# Patient Record
Sex: Male | Born: 1945 | Race: Black or African American | Hispanic: No | Marital: Married | Smoking: Light tobacco smoker
Health system: Southern US, Community
[De-identification: ages and names within clinical notes are randomized; demographics above are authoritative.]

## PROBLEM LIST (undated history)

## (undated) DIAGNOSIS — N529 Male erectile dysfunction, unspecified: Secondary | ICD-10-CM

## (undated) DIAGNOSIS — E785 Hyperlipidemia, unspecified: Secondary | ICD-10-CM

## (undated) DIAGNOSIS — I639 Cerebral infarction, unspecified: Secondary | ICD-10-CM

## (undated) DIAGNOSIS — R935 Abnormal findings on diagnostic imaging of other abdominal regions, including retroperitoneum: Secondary | ICD-10-CM

## (undated) DIAGNOSIS — I10 Essential (primary) hypertension: Secondary | ICD-10-CM

## (undated) DIAGNOSIS — L0100 Impetigo, unspecified: Secondary | ICD-10-CM

## (undated) DIAGNOSIS — K279 Peptic ulcer, site unspecified, unspecified as acute or chronic, without hemorrhage or perforation: Secondary | ICD-10-CM

## (undated) DIAGNOSIS — I6529 Occlusion and stenosis of unspecified carotid artery: Secondary | ICD-10-CM

## (undated) DIAGNOSIS — Z720 Tobacco use: Secondary | ICD-10-CM

## (undated) DIAGNOSIS — E876 Hypokalemia: Secondary | ICD-10-CM

## (undated) DIAGNOSIS — M545 Low back pain, unspecified: Secondary | ICD-10-CM

## (undated) DIAGNOSIS — R531 Weakness: Secondary | ICD-10-CM

## (undated) HISTORY — DX: Hypokalemia: E87.6

## (undated) HISTORY — DX: Weakness: R53.1

## (undated) HISTORY — DX: Peptic ulcer, site unspecified, unspecified as acute or chronic, without hemorrhage or perforation: K27.9

## (undated) HISTORY — DX: Cerebral infarction, unspecified: I63.9

## (undated) HISTORY — DX: Abnormal findings on diagnostic imaging of other abdominal regions, including retroperitoneum: R93.5

## (undated) HISTORY — DX: Low back pain: M54.5

## (undated) HISTORY — DX: Impetigo, unspecified: L01.00

## (undated) HISTORY — DX: Hyperlipidemia, unspecified: E78.5

## (undated) HISTORY — DX: Essential (primary) hypertension: I10

## (undated) HISTORY — DX: Tobacco use: Z72.0

## (undated) HISTORY — DX: Low back pain, unspecified: M54.50

## (undated) HISTORY — DX: Male erectile dysfunction, unspecified: N52.9

## (undated) HISTORY — DX: Occlusion and stenosis of unspecified carotid artery: I65.29

---

## 1999-06-10 ENCOUNTER — Emergency Department (HOSPITAL_COMMUNITY): Admission: EM | Admit: 1999-06-10 | Discharge: 1999-06-10 | Payer: Self-pay | Admitting: Emergency Medicine

## 2001-09-30 ENCOUNTER — Emergency Department (HOSPITAL_COMMUNITY): Admission: EM | Admit: 2001-09-30 | Discharge: 2001-09-30 | Payer: Self-pay

## 2002-08-12 DIAGNOSIS — I639 Cerebral infarction, unspecified: Secondary | ICD-10-CM

## 2002-08-12 HISTORY — DX: Cerebral infarction, unspecified: I63.9

## 2002-11-29 ENCOUNTER — Inpatient Hospital Stay (HOSPITAL_COMMUNITY): Admission: EM | Admit: 2002-11-29 | Discharge: 2002-12-02 | Payer: Self-pay | Admitting: *Deleted

## 2002-11-29 ENCOUNTER — Encounter: Payer: Self-pay | Admitting: Neurology

## 2002-11-29 ENCOUNTER — Encounter: Payer: Self-pay | Admitting: *Deleted

## 2002-11-30 ENCOUNTER — Encounter (INDEPENDENT_AMBULATORY_CARE_PROVIDER_SITE_OTHER): Payer: Self-pay | Admitting: Cardiology

## 2002-12-01 ENCOUNTER — Encounter: Payer: Self-pay | Admitting: Neurology

## 2002-12-02 ENCOUNTER — Inpatient Hospital Stay (HOSPITAL_COMMUNITY)
Admission: RE | Admit: 2002-12-02 | Discharge: 2002-12-24 | Payer: Self-pay | Admitting: Physical Medicine & Rehabilitation

## 2002-12-09 ENCOUNTER — Encounter: Payer: Self-pay | Admitting: Physical Medicine & Rehabilitation

## 2002-12-27 ENCOUNTER — Encounter: Admission: RE | Admit: 2002-12-27 | Discharge: 2002-12-27 | Payer: Self-pay | Admitting: Internal Medicine

## 2003-01-03 ENCOUNTER — Encounter: Admission: RE | Admit: 2003-01-03 | Discharge: 2003-01-03 | Payer: Self-pay | Admitting: Internal Medicine

## 2003-01-06 ENCOUNTER — Encounter: Admission: RE | Admit: 2003-01-06 | Discharge: 2003-01-06 | Payer: Self-pay | Admitting: Internal Medicine

## 2003-01-17 ENCOUNTER — Encounter: Admission: RE | Admit: 2003-01-17 | Discharge: 2003-01-17 | Payer: Self-pay | Admitting: Internal Medicine

## 2003-01-31 ENCOUNTER — Encounter
Admission: RE | Admit: 2003-01-31 | Discharge: 2003-05-01 | Payer: Self-pay | Admitting: Physical Medicine & Rehabilitation

## 2003-02-07 ENCOUNTER — Encounter: Admission: RE | Admit: 2003-02-07 | Discharge: 2003-02-07 | Payer: Self-pay | Admitting: Internal Medicine

## 2003-03-07 ENCOUNTER — Encounter: Admission: RE | Admit: 2003-03-07 | Discharge: 2003-03-07 | Payer: Self-pay | Admitting: Internal Medicine

## 2003-03-08 ENCOUNTER — Encounter
Admission: RE | Admit: 2003-03-08 | Discharge: 2003-06-06 | Payer: Self-pay | Admitting: Physical Medicine & Rehabilitation

## 2003-03-11 ENCOUNTER — Encounter: Admission: RE | Admit: 2003-03-11 | Discharge: 2003-03-11 | Payer: Self-pay | Admitting: Internal Medicine

## 2003-04-04 ENCOUNTER — Encounter: Admission: RE | Admit: 2003-04-04 | Discharge: 2003-04-04 | Payer: Self-pay | Admitting: Infectious Diseases

## 2003-05-06 ENCOUNTER — Encounter: Admission: RE | Admit: 2003-05-06 | Discharge: 2003-05-06 | Payer: Self-pay | Admitting: Internal Medicine

## 2003-05-09 ENCOUNTER — Encounter: Admission: RE | Admit: 2003-05-09 | Discharge: 2003-05-09 | Payer: Self-pay | Admitting: Internal Medicine

## 2003-05-11 ENCOUNTER — Encounter
Admission: RE | Admit: 2003-05-11 | Discharge: 2003-08-09 | Payer: Self-pay | Admitting: Physical Medicine & Rehabilitation

## 2003-05-13 ENCOUNTER — Encounter: Admission: RE | Admit: 2003-05-13 | Discharge: 2003-05-13 | Payer: Self-pay | Admitting: Internal Medicine

## 2003-06-07 ENCOUNTER — Encounter
Admission: RE | Admit: 2003-06-07 | Discharge: 2003-09-05 | Payer: Self-pay | Admitting: Physical Medicine & Rehabilitation

## 2003-06-13 ENCOUNTER — Encounter: Admission: RE | Admit: 2003-06-13 | Discharge: 2003-06-13 | Payer: Self-pay | Admitting: Internal Medicine

## 2003-07-11 ENCOUNTER — Encounter: Admission: RE | Admit: 2003-07-11 | Discharge: 2003-07-11 | Payer: Self-pay | Admitting: Internal Medicine

## 2003-07-29 ENCOUNTER — Encounter: Admission: RE | Admit: 2003-07-29 | Discharge: 2003-07-29 | Payer: Self-pay | Admitting: Internal Medicine

## 2003-08-11 ENCOUNTER — Encounter: Admission: RE | Admit: 2003-08-11 | Discharge: 2003-08-11 | Payer: Self-pay | Admitting: Internal Medicine

## 2003-08-18 ENCOUNTER — Ambulatory Visit (HOSPITAL_COMMUNITY): Admission: RE | Admit: 2003-08-18 | Discharge: 2003-08-18 | Payer: Self-pay | Admitting: Anesthesiology

## 2003-08-22 ENCOUNTER — Encounter: Admission: RE | Admit: 2003-08-22 | Discharge: 2003-08-22 | Payer: Self-pay | Admitting: Internal Medicine

## 2003-09-12 ENCOUNTER — Encounter: Admission: RE | Admit: 2003-09-12 | Discharge: 2003-09-12 | Payer: Self-pay | Admitting: Internal Medicine

## 2003-09-13 ENCOUNTER — Ambulatory Visit (HOSPITAL_COMMUNITY): Admission: RE | Admit: 2003-09-13 | Discharge: 2003-09-13 | Payer: Self-pay | Admitting: Internal Medicine

## 2003-09-20 ENCOUNTER — Encounter
Admission: RE | Admit: 2003-09-20 | Discharge: 2003-11-22 | Payer: Self-pay | Admitting: Physical Medicine & Rehabilitation

## 2003-10-04 ENCOUNTER — Encounter
Admission: RE | Admit: 2003-10-04 | Discharge: 2003-11-22 | Payer: Self-pay | Admitting: Physical Medicine & Rehabilitation

## 2003-10-13 ENCOUNTER — Encounter: Admission: RE | Admit: 2003-10-13 | Discharge: 2003-10-13 | Payer: Self-pay | Admitting: Internal Medicine

## 2003-10-17 ENCOUNTER — Encounter: Admission: RE | Admit: 2003-10-17 | Discharge: 2003-10-17 | Payer: Self-pay | Admitting: Internal Medicine

## 2003-11-02 ENCOUNTER — Encounter: Admission: RE | Admit: 2003-11-02 | Discharge: 2003-11-02 | Payer: Self-pay | Admitting: Internal Medicine

## 2003-11-14 ENCOUNTER — Encounter: Admission: RE | Admit: 2003-11-14 | Discharge: 2003-11-14 | Payer: Self-pay | Admitting: Internal Medicine

## 2003-11-22 ENCOUNTER — Encounter
Admission: RE | Admit: 2003-11-22 | Discharge: 2004-02-20 | Payer: Self-pay | Admitting: Physical Medicine & Rehabilitation

## 2003-12-12 ENCOUNTER — Encounter: Admission: RE | Admit: 2003-12-12 | Discharge: 2003-12-12 | Payer: Self-pay | Admitting: Internal Medicine

## 2004-01-09 ENCOUNTER — Encounter: Admission: RE | Admit: 2004-01-09 | Discharge: 2004-01-09 | Payer: Self-pay | Admitting: Internal Medicine

## 2004-02-06 ENCOUNTER — Encounter: Admission: RE | Admit: 2004-02-06 | Discharge: 2004-02-06 | Payer: Self-pay | Admitting: Internal Medicine

## 2004-02-19 ENCOUNTER — Emergency Department (HOSPITAL_COMMUNITY): Admission: EM | Admit: 2004-02-19 | Discharge: 2004-02-19 | Payer: Self-pay | Admitting: Emergency Medicine

## 2004-02-28 ENCOUNTER — Encounter: Admission: RE | Admit: 2004-02-28 | Discharge: 2004-02-28 | Payer: Self-pay | Admitting: Internal Medicine

## 2004-02-28 ENCOUNTER — Ambulatory Visit (HOSPITAL_COMMUNITY): Admission: RE | Admit: 2004-02-28 | Discharge: 2004-02-28 | Payer: Self-pay | Admitting: Internal Medicine

## 2004-03-05 ENCOUNTER — Encounter: Admission: RE | Admit: 2004-03-05 | Discharge: 2004-03-05 | Payer: Self-pay | Admitting: Internal Medicine

## 2004-03-06 ENCOUNTER — Encounter: Admission: RE | Admit: 2004-03-06 | Discharge: 2004-03-06 | Payer: Self-pay | Admitting: Internal Medicine

## 2004-03-26 ENCOUNTER — Encounter: Admission: RE | Admit: 2004-03-26 | Discharge: 2004-03-26 | Payer: Self-pay | Admitting: Internal Medicine

## 2004-04-23 ENCOUNTER — Ambulatory Visit: Payer: Self-pay | Admitting: Internal Medicine

## 2004-05-21 ENCOUNTER — Ambulatory Visit: Payer: Self-pay | Admitting: Internal Medicine

## 2004-06-18 ENCOUNTER — Ambulatory Visit: Payer: Self-pay | Admitting: Internal Medicine

## 2004-07-19 ENCOUNTER — Ambulatory Visit: Payer: Self-pay | Admitting: Internal Medicine

## 2004-08-16 ENCOUNTER — Ambulatory Visit: Payer: Self-pay | Admitting: Internal Medicine

## 2004-09-10 ENCOUNTER — Ambulatory Visit: Payer: Self-pay | Admitting: Internal Medicine

## 2004-09-12 ENCOUNTER — Ambulatory Visit: Payer: Self-pay | Admitting: Internal Medicine

## 2004-09-19 ENCOUNTER — Ambulatory Visit: Payer: Self-pay | Admitting: Internal Medicine

## 2004-10-08 ENCOUNTER — Ambulatory Visit: Payer: Self-pay | Admitting: Internal Medicine

## 2004-11-05 ENCOUNTER — Ambulatory Visit (HOSPITAL_COMMUNITY): Admission: RE | Admit: 2004-11-05 | Discharge: 2004-11-05 | Payer: Self-pay | Admitting: Internal Medicine

## 2004-11-05 ENCOUNTER — Ambulatory Visit: Payer: Self-pay | Admitting: Internal Medicine

## 2004-11-15 ENCOUNTER — Ambulatory Visit: Payer: Self-pay | Admitting: Internal Medicine

## 2004-12-03 ENCOUNTER — Ambulatory Visit: Payer: Self-pay

## 2004-12-10 ENCOUNTER — Ambulatory Visit (HOSPITAL_COMMUNITY): Admission: RE | Admit: 2004-12-10 | Discharge: 2004-12-10 | Payer: Self-pay | Admitting: Internal Medicine

## 2004-12-10 ENCOUNTER — Ambulatory Visit: Payer: Self-pay | Admitting: Internal Medicine

## 2004-12-20 ENCOUNTER — Ambulatory Visit: Payer: Self-pay | Admitting: Internal Medicine

## 2005-01-17 ENCOUNTER — Ambulatory Visit: Payer: Self-pay

## 2005-02-18 ENCOUNTER — Ambulatory Visit: Payer: Self-pay | Admitting: Internal Medicine

## 2005-03-25 ENCOUNTER — Ambulatory Visit: Payer: Self-pay | Admitting: Internal Medicine

## 2005-04-22 ENCOUNTER — Ambulatory Visit: Payer: Self-pay | Admitting: Internal Medicine

## 2005-05-20 ENCOUNTER — Ambulatory Visit: Payer: Self-pay

## 2005-06-24 ENCOUNTER — Ambulatory Visit: Payer: Self-pay | Admitting: Internal Medicine

## 2005-07-22 ENCOUNTER — Ambulatory Visit: Payer: Self-pay | Admitting: Internal Medicine

## 2005-08-19 ENCOUNTER — Ambulatory Visit: Payer: Self-pay | Admitting: Internal Medicine

## 2005-09-16 ENCOUNTER — Ambulatory Visit: Payer: Self-pay | Admitting: Internal Medicine

## 2005-10-14 ENCOUNTER — Ambulatory Visit: Payer: Self-pay | Admitting: Hospitalist

## 2005-10-22 ENCOUNTER — Ambulatory Visit: Payer: Self-pay | Admitting: Hospitalist

## 2005-10-25 ENCOUNTER — Ambulatory Visit: Payer: Self-pay | Admitting: Internal Medicine

## 2005-11-18 ENCOUNTER — Ambulatory Visit: Payer: Self-pay | Admitting: Internal Medicine

## 2005-12-06 ENCOUNTER — Ambulatory Visit (HOSPITAL_COMMUNITY): Admission: RE | Admit: 2005-12-06 | Discharge: 2005-12-06 | Payer: Self-pay | Admitting: Internal Medicine

## 2005-12-06 ENCOUNTER — Ambulatory Visit: Payer: Self-pay | Admitting: Internal Medicine

## 2005-12-16 ENCOUNTER — Ambulatory Visit: Payer: Self-pay | Admitting: Internal Medicine

## 2006-01-13 ENCOUNTER — Ambulatory Visit: Payer: Self-pay | Admitting: Internal Medicine

## 2006-02-10 ENCOUNTER — Ambulatory Visit: Payer: Self-pay | Admitting: Internal Medicine

## 2006-03-03 ENCOUNTER — Ambulatory Visit: Payer: Self-pay | Admitting: Hospitalist

## 2006-03-31 ENCOUNTER — Ambulatory Visit: Payer: Self-pay | Admitting: Internal Medicine

## 2006-04-15 ENCOUNTER — Ambulatory Visit: Payer: Self-pay | Admitting: Hospitalist

## 2006-04-28 ENCOUNTER — Ambulatory Visit: Payer: Self-pay | Admitting: Hospitalist

## 2006-05-21 DIAGNOSIS — M545 Low back pain: Secondary | ICD-10-CM

## 2006-05-21 DIAGNOSIS — L0100 Impetigo, unspecified: Secondary | ICD-10-CM | POA: Insufficient documentation

## 2006-05-21 DIAGNOSIS — I10 Essential (primary) hypertension: Secondary | ICD-10-CM

## 2006-05-21 DIAGNOSIS — E785 Hyperlipidemia, unspecified: Secondary | ICD-10-CM

## 2006-05-21 DIAGNOSIS — I6789 Other cerebrovascular disease: Secondary | ICD-10-CM

## 2006-05-26 ENCOUNTER — Ambulatory Visit: Payer: Self-pay | Admitting: Internal Medicine

## 2006-06-23 ENCOUNTER — Ambulatory Visit: Payer: Self-pay | Admitting: Hospitalist

## 2006-07-21 ENCOUNTER — Ambulatory Visit: Payer: Self-pay | Admitting: Internal Medicine

## 2006-08-14 ENCOUNTER — Ambulatory Visit: Payer: Self-pay | Admitting: Hospitalist

## 2006-08-14 LAB — CONVERTED CEMR LAB
ALT: 18 units/L (ref 0–53)
AST: 20 units/L (ref 0–37)
Albumin: 3.8 g/dL (ref 3.5–5.2)
CO2: 34 meq/L — ABNORMAL HIGH (ref 19–32)
Calcium: 9.6 mg/dL (ref 8.4–10.5)
Chloride: 95 meq/L — ABNORMAL LOW (ref 96–112)
Creatinine, Ser: 0.66 mg/dL (ref 0.40–1.50)
Potassium: 3.2 meq/L — ABNORMAL LOW (ref 3.5–5.3)
Sodium: 137 meq/L (ref 135–145)
Total Protein: 7.4 g/dL (ref 6.0–8.3)

## 2006-08-18 ENCOUNTER — Ambulatory Visit: Payer: Self-pay | Admitting: Hospitalist

## 2006-08-21 ENCOUNTER — Encounter (INDEPENDENT_AMBULATORY_CARE_PROVIDER_SITE_OTHER): Payer: Self-pay | Admitting: Hospitalist

## 2006-08-21 ENCOUNTER — Ambulatory Visit: Payer: Self-pay | Admitting: Internal Medicine

## 2006-08-21 LAB — CONVERTED CEMR LAB
BUN: 11 mg/dL (ref 6–23)
CO2: 32 meq/L (ref 19–32)
Chloride: 100 meq/L (ref 96–112)
Glucose, Bld: 85 mg/dL (ref 70–99)
Potassium: 3.6 meq/L (ref 3.5–5.3)

## 2006-08-31 DIAGNOSIS — Z8711 Personal history of peptic ulcer disease: Secondary | ICD-10-CM

## 2006-09-08 ENCOUNTER — Ambulatory Visit: Payer: Self-pay | Admitting: Internal Medicine

## 2006-09-15 ENCOUNTER — Ambulatory Visit: Payer: Self-pay | Admitting: Internal Medicine

## 2006-09-15 ENCOUNTER — Telehealth (INDEPENDENT_AMBULATORY_CARE_PROVIDER_SITE_OTHER): Payer: Self-pay | Admitting: *Deleted

## 2006-09-15 ENCOUNTER — Encounter (INDEPENDENT_AMBULATORY_CARE_PROVIDER_SITE_OTHER): Payer: Self-pay | Admitting: Internal Medicine

## 2006-09-15 LAB — CONVERTED CEMR LAB
Albumin: 4.3 g/dL (ref 3.5–5.2)
Alkaline Phosphatase: 93 units/L (ref 39–117)
BUN: 14 mg/dL (ref 6–23)
Glucose, Bld: 83 mg/dL (ref 70–99)
HDL: 33 mg/dL — ABNORMAL LOW (ref >39)
LDL Cholesterol: 92 mg/dL (ref 0–99)
Potassium: 3.2 meq/L — ABNORMAL LOW (ref 3.5–5.3)
Total Bilirubin: 1 mg/dL (ref 0.3–1.2)
Triglycerides: 40 mg/dL (ref <150)

## 2006-09-16 ENCOUNTER — Encounter (INDEPENDENT_AMBULATORY_CARE_PROVIDER_SITE_OTHER): Payer: Self-pay | Admitting: *Deleted

## 2006-09-17 ENCOUNTER — Telehealth: Payer: Self-pay | Admitting: *Deleted

## 2006-10-13 ENCOUNTER — Ambulatory Visit: Payer: Self-pay | Admitting: *Deleted

## 2006-10-20 ENCOUNTER — Telehealth: Payer: Self-pay | Admitting: *Deleted

## 2006-10-27 ENCOUNTER — Encounter (INDEPENDENT_AMBULATORY_CARE_PROVIDER_SITE_OTHER): Payer: Self-pay | Admitting: Pharmacist

## 2006-10-27 ENCOUNTER — Ambulatory Visit: Payer: Self-pay | Admitting: Internal Medicine

## 2006-10-27 LAB — CONVERTED CEMR LAB

## 2006-12-01 ENCOUNTER — Ambulatory Visit: Payer: Self-pay | Admitting: Internal Medicine

## 2006-12-01 LAB — CONVERTED CEMR LAB: INR: 2

## 2006-12-29 ENCOUNTER — Ambulatory Visit: Payer: Self-pay | Admitting: Internal Medicine

## 2006-12-29 LAB — CONVERTED CEMR LAB

## 2007-01-13 ENCOUNTER — Telehealth (INDEPENDENT_AMBULATORY_CARE_PROVIDER_SITE_OTHER): Payer: Self-pay | Admitting: *Deleted

## 2007-01-19 ENCOUNTER — Ambulatory Visit: Payer: Self-pay | Admitting: Internal Medicine

## 2007-01-19 ENCOUNTER — Telehealth: Payer: Self-pay | Admitting: *Deleted

## 2007-01-19 LAB — CONVERTED CEMR LAB: INR: 2.8

## 2007-02-03 ENCOUNTER — Ambulatory Visit: Payer: Self-pay | Admitting: Internal Medicine

## 2007-02-03 LAB — CONVERTED CEMR LAB
BUN: 12 mg/dL (ref 6–23)
CO2: 31 meq/L (ref 19–32)
Calcium: 9.3 mg/dL (ref 8.4–10.5)
Creatinine, Ser: 0.86 mg/dL (ref 0.40–1.50)
Glucose, Bld: 142 mg/dL — ABNORMAL HIGH (ref 70–99)

## 2007-02-09 ENCOUNTER — Telehealth: Payer: Self-pay | Admitting: *Deleted

## 2007-02-09 ENCOUNTER — Ambulatory Visit: Payer: Self-pay | Admitting: Internal Medicine

## 2007-02-09 ENCOUNTER — Encounter (INDEPENDENT_AMBULATORY_CARE_PROVIDER_SITE_OTHER): Payer: Self-pay | Admitting: Internal Medicine

## 2007-02-09 LAB — CONVERTED CEMR LAB
BUN: 14 mg/dL (ref 6–23)
Calcium: 9.8 mg/dL (ref 8.4–10.5)
Creatinine, Ser: 0.99 mg/dL (ref 0.40–1.50)
Glucose, Bld: 114 mg/dL — ABNORMAL HIGH (ref 70–99)
Sodium: 136 meq/L (ref 135–145)

## 2007-02-27 ENCOUNTER — Encounter (INDEPENDENT_AMBULATORY_CARE_PROVIDER_SITE_OTHER): Payer: Self-pay | Admitting: Internal Medicine

## 2007-02-27 ENCOUNTER — Ambulatory Visit: Payer: Self-pay | Admitting: *Deleted

## 2007-02-27 DIAGNOSIS — F528 Other sexual dysfunction not due to a substance or known physiological condition: Secondary | ICD-10-CM | POA: Insufficient documentation

## 2007-03-01 LAB — CONVERTED CEMR LAB
ALT: 18 units/L (ref 0–53)
AST: 22 units/L (ref 0–37)
Alkaline Phosphatase: 120 units/L — ABNORMAL HIGH (ref 39–117)
Sodium: 138 meq/L (ref 135–145)
Total Bilirubin: 0.7 mg/dL (ref 0.3–1.2)
Total Protein: 8.8 g/dL — ABNORMAL HIGH (ref 6.0–8.3)

## 2007-03-02 ENCOUNTER — Telehealth: Payer: Self-pay | Admitting: *Deleted

## 2007-03-09 ENCOUNTER — Ambulatory Visit: Payer: Self-pay | Admitting: *Deleted

## 2007-03-09 ENCOUNTER — Telehealth (INDEPENDENT_AMBULATORY_CARE_PROVIDER_SITE_OTHER): Payer: Self-pay | Admitting: *Deleted

## 2007-03-16 ENCOUNTER — Telehealth (INDEPENDENT_AMBULATORY_CARE_PROVIDER_SITE_OTHER): Payer: Self-pay | Admitting: Internal Medicine

## 2007-04-06 ENCOUNTER — Ambulatory Visit: Payer: Self-pay | Admitting: Internal Medicine

## 2007-04-06 LAB — CONVERTED CEMR LAB: INR: 2.2

## 2007-04-17 ENCOUNTER — Ambulatory Visit: Payer: Self-pay | Admitting: Hospitalist

## 2007-04-17 ENCOUNTER — Encounter (INDEPENDENT_AMBULATORY_CARE_PROVIDER_SITE_OTHER): Payer: Self-pay | Admitting: Internal Medicine

## 2007-04-17 DIAGNOSIS — R5381 Other malaise: Secondary | ICD-10-CM | POA: Insufficient documentation

## 2007-04-17 DIAGNOSIS — R5383 Other fatigue: Secondary | ICD-10-CM

## 2007-04-20 ENCOUNTER — Telehealth: Payer: Self-pay | Admitting: *Deleted

## 2007-04-21 ENCOUNTER — Ambulatory Visit (HOSPITAL_COMMUNITY): Admission: RE | Admit: 2007-04-21 | Discharge: 2007-04-21 | Payer: Self-pay | Admitting: Internal Medicine

## 2007-04-21 LAB — CONVERTED CEMR LAB
ALT: 15 units/L (ref 0–53)
AST: 17 units/L (ref 0–37)
Albumin: 4.4 g/dL (ref 3.5–5.2)
Alkaline Phosphatase: 94 units/L (ref 39–117)
BUN: 16 mg/dL (ref 6–23)
CO2: 29 meq/L (ref 19–32)
Calcium: 9.4 mg/dL (ref 8.4–10.5)
Chloride: 101 meq/L (ref 96–112)
Creatinine, Ser: 0.93 mg/dL (ref 0.40–1.50)
Glucose, Bld: 72 mg/dL (ref 70–99)
HCT: 41.2 % (ref 39.0–52.0)
Hemoglobin: 13.4 g/dL (ref 13.0–17.0)
LDH: 140 units/L (ref 94–250)
MCHC: 32.5 g/dL (ref 30.0–36.0)
MCV: 82.9 fL (ref 78.0–100.0)
Platelets: 227 10*3/uL (ref 150–400)
Potassium: 3.7 meq/L (ref 3.5–5.3)
RBC: 4.97 M/uL (ref 4.22–5.81)
RDW: 15.2 % — ABNORMAL HIGH (ref 11.5–14.0)
Sed Rate: 18 mm/hr — ABNORMAL HIGH (ref 0–16)
Sodium: 140 meq/L (ref 135–145)
TSH: 1.208 microintl units/mL (ref 0.350–5.50)
Testosterone: 397.3 ng/dL (ref 350–890)
Total Bilirubin: 0.7 mg/dL (ref 0.3–1.2)
Total Protein: 7.7 g/dL (ref 6.0–8.3)
Vit D, 1,25-Dihydroxy: 29 (ref 20–57)
WBC: 4 10*3/uL (ref 4.0–10.5)

## 2007-05-04 ENCOUNTER — Ambulatory Visit: Payer: Self-pay | Admitting: Internal Medicine

## 2007-05-19 ENCOUNTER — Telehealth: Payer: Self-pay | Admitting: *Deleted

## 2007-06-01 ENCOUNTER — Ambulatory Visit: Payer: Self-pay | Admitting: Infectious Diseases

## 2007-06-01 LAB — CONVERTED CEMR LAB: INR: 3.6

## 2007-06-19 ENCOUNTER — Telehealth: Payer: Self-pay | Admitting: *Deleted

## 2007-06-29 ENCOUNTER — Ambulatory Visit: Payer: Self-pay | Admitting: *Deleted

## 2007-07-27 ENCOUNTER — Ambulatory Visit: Payer: Self-pay | Admitting: Infectious Diseases

## 2007-07-27 LAB — CONVERTED CEMR LAB: INR: 2.6

## 2007-08-24 ENCOUNTER — Ambulatory Visit: Payer: Self-pay | Admitting: Internal Medicine

## 2007-08-24 LAB — CONVERTED CEMR LAB

## 2007-08-25 ENCOUNTER — Ambulatory Visit: Payer: Self-pay | Admitting: Vascular Surgery

## 2007-09-21 ENCOUNTER — Ambulatory Visit: Payer: Self-pay | Admitting: Hospitalist

## 2007-09-21 LAB — CONVERTED CEMR LAB: INR: 2.3

## 2007-09-22 ENCOUNTER — Telehealth (INDEPENDENT_AMBULATORY_CARE_PROVIDER_SITE_OTHER): Payer: Self-pay | Admitting: Internal Medicine

## 2007-10-19 ENCOUNTER — Ambulatory Visit: Payer: Self-pay | Admitting: Infectious Diseases

## 2007-11-09 ENCOUNTER — Ambulatory Visit: Payer: Self-pay | Admitting: Internal Medicine

## 2007-11-09 LAB — CONVERTED CEMR LAB: INR: 2.8

## 2007-12-07 ENCOUNTER — Ambulatory Visit: Payer: Self-pay | Admitting: Hospitalist

## 2007-12-07 LAB — CONVERTED CEMR LAB: INR: 2.9

## 2007-12-31 ENCOUNTER — Ambulatory Visit: Payer: Self-pay | Admitting: Internal Medicine

## 2007-12-31 LAB — CONVERTED CEMR LAB: INR: 3

## 2008-01-18 ENCOUNTER — Ambulatory Visit: Payer: Self-pay | Admitting: *Deleted

## 2008-01-22 ENCOUNTER — Ambulatory Visit: Payer: Self-pay | Admitting: *Deleted

## 2008-01-22 ENCOUNTER — Encounter (INDEPENDENT_AMBULATORY_CARE_PROVIDER_SITE_OTHER): Payer: Self-pay | Admitting: Internal Medicine

## 2008-01-22 LAB — CONVERTED CEMR LAB
Alkaline Phosphatase: 95 units/L (ref 39–117)
BUN: 16 mg/dL (ref 6–23)
CO2: 31 meq/L (ref 19–32)
Cholesterol: 117 mg/dL (ref 0–200)
Creatinine, Ser: 1.01 mg/dL (ref 0.40–1.50)
Glucose, Bld: 101 mg/dL — ABNORMAL HIGH (ref 70–99)
HDL: 36 mg/dL — ABNORMAL LOW (ref >39)
LDL Cholesterol: 74 mg/dL (ref 0–99)
Sodium: 139 meq/L (ref 135–145)
Total Bilirubin: 0.8 mg/dL (ref 0.3–1.2)
Total CHOL/HDL Ratio: 3.3
Total Protein: 7.2 g/dL (ref 6.0–8.3)
Triglycerides: 37 mg/dL (ref <150)
VLDL: 7 mg/dL (ref 0–40)

## 2008-02-23 ENCOUNTER — Ambulatory Visit: Payer: Self-pay | Admitting: Vascular Surgery

## 2008-02-29 ENCOUNTER — Ambulatory Visit: Payer: Self-pay | Admitting: Infectious Diseases

## 2008-03-31 ENCOUNTER — Encounter (INDEPENDENT_AMBULATORY_CARE_PROVIDER_SITE_OTHER): Payer: Self-pay | Admitting: Internal Medicine

## 2008-04-04 ENCOUNTER — Ambulatory Visit: Payer: Self-pay | Admitting: Internal Medicine

## 2008-04-04 LAB — CONVERTED CEMR LAB

## 2008-04-06 ENCOUNTER — Emergency Department (HOSPITAL_COMMUNITY): Admission: EM | Admit: 2008-04-06 | Discharge: 2008-04-06 | Payer: Self-pay | Admitting: Emergency Medicine

## 2008-04-06 ENCOUNTER — Telehealth: Payer: Self-pay | Admitting: Internal Medicine

## 2008-04-08 ENCOUNTER — Ambulatory Visit: Payer: Self-pay | Admitting: Internal Medicine

## 2008-04-08 ENCOUNTER — Encounter (INDEPENDENT_AMBULATORY_CARE_PROVIDER_SITE_OTHER): Payer: Self-pay | Admitting: Internal Medicine

## 2008-04-11 LAB — CONVERTED CEMR LAB
Albumin: 4 g/dL (ref 3.5–5.2)
Alkaline Phosphatase: 89 units/L (ref 39–117)
BUN: 13 mg/dL (ref 6–23)
Calcium: 9.5 mg/dL (ref 8.4–10.5)
Chloride: 105 meq/L (ref 96–112)
Creatinine, Ser: 0.86 mg/dL (ref 0.40–1.50)
Eosinophils Absolute: 0.1 10*3/uL (ref 0.0–0.7)
Glucose, Bld: 120 mg/dL — ABNORMAL HIGH (ref 70–99)
Hemoglobin: 11.9 g/dL — ABNORMAL LOW (ref 13.0–17.0)
Lymphs Abs: 1.3 10*3/uL (ref 0.7–4.0)
MCHC: 32.6 g/dL (ref 30.0–36.0)
MCV: 83.5 fL (ref 78.0–100.0)
Monocytes Absolute: 0.4 10*3/uL (ref 0.1–1.0)
Monocytes Relative: 8 % (ref 3–12)
Neutrophils Relative %: 60 % (ref 43–77)
Potassium: 3.9 meq/L (ref 3.5–5.3)
RBC: 4.37 M/uL (ref 4.22–5.81)
WBC: 4.4 10*3/uL (ref 4.0–10.5)

## 2008-04-22 ENCOUNTER — Encounter (INDEPENDENT_AMBULATORY_CARE_PROVIDER_SITE_OTHER): Payer: Self-pay | Admitting: Internal Medicine

## 2008-04-22 ENCOUNTER — Ambulatory Visit: Payer: Self-pay | Admitting: Internal Medicine

## 2008-04-22 LAB — CONVERTED CEMR LAB: OCCULT 1: NEGATIVE

## 2008-04-23 LAB — CONVERTED CEMR LAB: OCCULT 3: NEGATIVE

## 2008-04-24 LAB — CONVERTED CEMR LAB: OCCULT 2: NEGATIVE

## 2008-04-25 ENCOUNTER — Ambulatory Visit (HOSPITAL_COMMUNITY): Admission: RE | Admit: 2008-04-25 | Discharge: 2008-04-25 | Payer: Self-pay | Admitting: Internal Medicine

## 2008-04-25 DIAGNOSIS — I69959 Hemiplegia and hemiparesis following unspecified cerebrovascular disease affecting unspecified side: Secondary | ICD-10-CM | POA: Insufficient documentation

## 2008-04-25 LAB — CONVERTED CEMR LAB
ALT: 21 units/L (ref 0–53)
AST: 18 units/L (ref 0–37)
Albumin: 4.2 g/dL (ref 3.5–5.2)
Alkaline Phosphatase: 99 units/L (ref 39–117)
BUN: 8 mg/dL (ref 6–23)
Basophils Absolute: 0 10*3/uL (ref 0.0–0.1)
Basophils Relative: 1 % (ref 0–1)
Eosinophils Absolute: 0.1 10*3/uL (ref 0.0–0.7)
Iron: 57 ug/dL (ref 42–165)
MCHC: 31.1 g/dL (ref 30.0–36.0)
MCV: 85.4 fL (ref 78.0–100.0)
Neutro Abs: 2.2 10*3/uL (ref 1.7–7.7)
Neutrophils Relative %: 53 % (ref 43–77)
PSA: 0.73 ng/mL (ref 0.10–4.00)
Platelets: 277 10*3/uL (ref 150–400)
Potassium: 4.4 meq/L (ref 3.5–5.3)
RDW: 15.4 % (ref 11.5–15.5)
Sodium: 139 meq/L (ref 135–145)
TIBC: 258 ug/dL (ref 215–435)
Total Protein: 7.6 g/dL (ref 6.0–8.3)
UIBC: 201 ug/dL

## 2008-04-29 ENCOUNTER — Encounter (INDEPENDENT_AMBULATORY_CARE_PROVIDER_SITE_OTHER): Payer: Self-pay | Admitting: Internal Medicine

## 2008-05-02 ENCOUNTER — Ambulatory Visit: Payer: Self-pay | Admitting: Internal Medicine

## 2008-05-03 ENCOUNTER — Ambulatory Visit: Payer: Self-pay | Admitting: Internal Medicine

## 2008-05-23 ENCOUNTER — Ambulatory Visit: Payer: Self-pay | Admitting: Internal Medicine

## 2008-05-23 ENCOUNTER — Encounter: Payer: Self-pay | Admitting: Internal Medicine

## 2008-05-23 ENCOUNTER — Encounter: Payer: Self-pay | Admitting: Pharmacist

## 2008-05-23 LAB — CONVERTED CEMR LAB: INR: 2.1

## 2008-05-25 ENCOUNTER — Ambulatory Visit (HOSPITAL_COMMUNITY): Admission: RE | Admit: 2008-05-25 | Discharge: 2008-05-25 | Payer: Self-pay | Admitting: Internal Medicine

## 2008-05-26 ENCOUNTER — Encounter: Payer: Self-pay | Admitting: Internal Medicine

## 2008-05-26 LAB — CONVERTED CEMR LAB
ALT: 14 units/L (ref 0–53)
CO2: 23 meq/L (ref 19–32)
Creatinine, Ser: 0.73 mg/dL (ref 0.40–1.50)
Hemoglobin, Urine: NEGATIVE
Total Bilirubin: 0.5 mg/dL (ref 0.3–1.2)
Urine Glucose: NEGATIVE mg/dL
pH: 7 (ref 5.0–8.0)

## 2008-05-27 ENCOUNTER — Encounter (INDEPENDENT_AMBULATORY_CARE_PROVIDER_SITE_OTHER): Payer: Self-pay | Admitting: Internal Medicine

## 2008-06-20 ENCOUNTER — Ambulatory Visit: Payer: Self-pay | Admitting: *Deleted

## 2008-07-12 ENCOUNTER — Ambulatory Visit: Payer: Self-pay | Admitting: Internal Medicine

## 2008-07-18 ENCOUNTER — Ambulatory Visit: Payer: Self-pay | Admitting: Internal Medicine

## 2008-07-20 ENCOUNTER — Telehealth: Payer: Self-pay | Admitting: *Deleted

## 2008-07-28 ENCOUNTER — Ambulatory Visit: Payer: Self-pay | Admitting: Internal Medicine

## 2008-08-19 ENCOUNTER — Ambulatory Visit: Payer: Self-pay | Admitting: Infectious Disease

## 2008-08-25 ENCOUNTER — Telehealth: Payer: Self-pay | Admitting: *Deleted

## 2008-09-12 ENCOUNTER — Encounter (INDEPENDENT_AMBULATORY_CARE_PROVIDER_SITE_OTHER): Payer: Self-pay | Admitting: Internal Medicine

## 2008-09-12 ENCOUNTER — Ambulatory Visit: Payer: Self-pay | Admitting: Internal Medicine

## 2008-09-12 LAB — CONVERTED CEMR LAB: INR: 2.7

## 2008-09-26 ENCOUNTER — Encounter (INDEPENDENT_AMBULATORY_CARE_PROVIDER_SITE_OTHER): Payer: Self-pay | Admitting: Internal Medicine

## 2008-09-27 ENCOUNTER — Encounter: Payer: Self-pay | Admitting: Internal Medicine

## 2008-10-07 ENCOUNTER — Encounter: Admission: RE | Admit: 2008-10-07 | Discharge: 2008-10-07 | Payer: Self-pay | Admitting: General Surgery

## 2008-10-10 ENCOUNTER — Ambulatory Visit: Payer: Self-pay | Admitting: *Deleted

## 2008-10-10 LAB — CONVERTED CEMR LAB: INR: 2.2

## 2008-10-13 ENCOUNTER — Encounter (INDEPENDENT_AMBULATORY_CARE_PROVIDER_SITE_OTHER): Payer: Self-pay | Admitting: Internal Medicine

## 2008-10-31 ENCOUNTER — Ambulatory Visit: Payer: Self-pay | Admitting: Internal Medicine

## 2008-10-31 LAB — CONVERTED CEMR LAB

## 2008-11-28 ENCOUNTER — Ambulatory Visit: Payer: Self-pay | Admitting: Internal Medicine

## 2008-12-19 ENCOUNTER — Telehealth (INDEPENDENT_AMBULATORY_CARE_PROVIDER_SITE_OTHER): Payer: Self-pay | Admitting: Internal Medicine

## 2009-01-16 ENCOUNTER — Ambulatory Visit: Payer: Self-pay | Admitting: Internal Medicine

## 2009-02-01 ENCOUNTER — Telehealth: Payer: Self-pay | Admitting: *Deleted

## 2009-02-06 ENCOUNTER — Telehealth: Payer: Self-pay | Admitting: *Deleted

## 2009-02-20 ENCOUNTER — Ambulatory Visit: Payer: Self-pay | Admitting: Infectious Diseases

## 2009-02-20 LAB — CONVERTED CEMR LAB: INR: 2.6

## 2009-02-21 ENCOUNTER — Ambulatory Visit: Payer: Self-pay | Admitting: Vascular Surgery

## 2009-03-01 ENCOUNTER — Encounter: Admission: RE | Admit: 2009-03-01 | Discharge: 2009-03-01 | Payer: Self-pay | Admitting: General Surgery

## 2009-03-20 ENCOUNTER — Ambulatory Visit: Payer: Self-pay | Admitting: Internal Medicine

## 2009-03-20 LAB — CONVERTED CEMR LAB

## 2009-04-24 ENCOUNTER — Ambulatory Visit: Payer: Self-pay | Admitting: Infectious Diseases

## 2009-04-24 LAB — CONVERTED CEMR LAB: INR: 3.3

## 2009-05-04 ENCOUNTER — Telehealth (INDEPENDENT_AMBULATORY_CARE_PROVIDER_SITE_OTHER): Payer: Self-pay | Admitting: *Deleted

## 2009-05-22 ENCOUNTER — Ambulatory Visit: Payer: Self-pay | Admitting: Internal Medicine

## 2009-06-12 ENCOUNTER — Encounter: Admission: RE | Admit: 2009-06-12 | Discharge: 2009-06-12 | Payer: Self-pay | Admitting: General Surgery

## 2009-06-19 ENCOUNTER — Ambulatory Visit: Payer: Self-pay | Admitting: Internal Medicine

## 2009-07-24 ENCOUNTER — Ambulatory Visit: Payer: Self-pay | Admitting: Internal Medicine

## 2009-07-24 LAB — CONVERTED CEMR LAB: INR: 2.1

## 2009-08-03 ENCOUNTER — Telehealth (INDEPENDENT_AMBULATORY_CARE_PROVIDER_SITE_OTHER): Payer: Self-pay | Admitting: *Deleted

## 2009-08-21 ENCOUNTER — Ambulatory Visit: Payer: Self-pay | Admitting: Internal Medicine

## 2009-08-21 LAB — CONVERTED CEMR LAB: INR: 2.2

## 2009-08-24 ENCOUNTER — Ambulatory Visit: Payer: Self-pay | Admitting: Internal Medicine

## 2009-08-24 DIAGNOSIS — I6529 Occlusion and stenosis of unspecified carotid artery: Secondary | ICD-10-CM | POA: Insufficient documentation

## 2009-08-24 LAB — CONVERTED CEMR LAB

## 2009-08-29 ENCOUNTER — Encounter: Payer: Self-pay | Admitting: Internal Medicine

## 2009-08-29 ENCOUNTER — Ambulatory Visit: Payer: Self-pay | Admitting: Internal Medicine

## 2009-08-29 ENCOUNTER — Ambulatory Visit (HOSPITAL_COMMUNITY): Admission: RE | Admit: 2009-08-29 | Discharge: 2009-08-29 | Payer: Self-pay | Admitting: Internal Medicine

## 2009-08-29 LAB — CONVERTED CEMR LAB
ALT: 10 units/L (ref 0–53)
AST: 14 units/L (ref 0–37)
Creatinine, Ser: 0.74 mg/dL (ref 0.40–1.50)
HDL: 39 mg/dL — ABNORMAL LOW (ref >39)
LDL Cholesterol: 152 mg/dL — ABNORMAL HIGH (ref 0–99)
Total Bilirubin: 0.4 mg/dL (ref 0.3–1.2)
Triglycerides: 46 mg/dL (ref <150)
VLDL: 9 mg/dL (ref 0–40)

## 2009-09-18 ENCOUNTER — Encounter: Payer: Self-pay | Admitting: Pharmacist

## 2009-09-18 ENCOUNTER — Ambulatory Visit: Payer: Self-pay | Admitting: Internal Medicine

## 2009-09-18 LAB — CONVERTED CEMR LAB: INR: 1.8

## 2009-09-21 ENCOUNTER — Ambulatory Visit: Payer: Self-pay | Admitting: Vascular Surgery

## 2009-10-12 ENCOUNTER — Ambulatory Visit: Payer: Self-pay | Admitting: Internal Medicine

## 2009-10-12 DIAGNOSIS — R1031 Right lower quadrant pain: Secondary | ICD-10-CM | POA: Insufficient documentation

## 2009-10-16 ENCOUNTER — Ambulatory Visit: Payer: Self-pay | Admitting: Infectious Diseases

## 2009-10-16 LAB — CONVERTED CEMR LAB
ALT: 13 units/L (ref 0–53)
Albumin: 3.9 g/dL (ref 3.5–5.2)
Basophils Relative: 1 % (ref 0–1)
CO2: 27 meq/L (ref 19–32)
Calcium: 8.9 mg/dL (ref 8.4–10.5)
Chloride: 101 meq/L (ref 96–112)
Glucose, Bld: 77 mg/dL (ref 70–99)
Hemoglobin: 13.7 g/dL (ref 13.0–17.0)
Lipase: 54 units/L (ref 0–75)
Lymphs Abs: 1 10*3/uL (ref 0.7–4.0)
MCHC: 31.8 g/dL (ref 30.0–36.0)
MCV: 84 fL (ref >=78.0)
Monocytes Absolute: 0.4 10*3/uL (ref 0.1–1.0)
Monocytes Relative: 11 % (ref 3–12)
Neutro Abs: 2.4 10*3/uL (ref 1.7–7.7)
RBC: 5.13 M/uL (ref 4.22–5.81)
Sodium: 135 meq/L (ref 135–145)
Total Bilirubin: 0.5 mg/dL (ref 0.3–1.2)
Total Protein: 7.5 g/dL (ref 6.0–8.3)
WBC: 4 10*3/uL (ref 4.0–10.5)

## 2009-10-23 ENCOUNTER — Ambulatory Visit: Payer: Self-pay | Admitting: Infectious Diseases

## 2009-10-23 LAB — CONVERTED CEMR LAB

## 2009-10-25 ENCOUNTER — Encounter: Payer: Self-pay | Admitting: Internal Medicine

## 2009-11-06 ENCOUNTER — Telehealth: Payer: Self-pay | Admitting: *Deleted

## 2009-11-08 ENCOUNTER — Ambulatory Visit: Payer: Self-pay | Admitting: Infectious Diseases

## 2009-11-20 ENCOUNTER — Ambulatory Visit: Payer: Self-pay | Admitting: Internal Medicine

## 2009-11-20 LAB — CONVERTED CEMR LAB: INR: 4.5

## 2009-11-28 ENCOUNTER — Ambulatory Visit: Payer: Self-pay | Admitting: Internal Medicine

## 2009-11-28 LAB — CONVERTED CEMR LAB
BUN: 14 mg/dL (ref 6–23)
CO2: 25 meq/L (ref 19–32)
Calcium: 9.1 mg/dL (ref 8.4–10.5)
Chloride: 101 meq/L (ref 96–112)
Creatinine, Ser: 0.73 mg/dL (ref 0.40–1.50)
Glucose, Bld: 92 mg/dL (ref 70–99)

## 2009-12-11 ENCOUNTER — Ambulatory Visit: Payer: Self-pay | Admitting: Internal Medicine

## 2010-01-22 ENCOUNTER — Ambulatory Visit: Payer: Self-pay | Admitting: Internal Medicine

## 2010-02-20 ENCOUNTER — Ambulatory Visit: Payer: Self-pay | Admitting: Internal Medicine

## 2010-02-20 LAB — CONVERTED CEMR LAB: INR: 3.4

## 2010-02-26 ENCOUNTER — Telehealth: Payer: Self-pay | Admitting: Internal Medicine

## 2010-03-19 ENCOUNTER — Ambulatory Visit: Payer: Self-pay | Admitting: Internal Medicine

## 2010-03-19 LAB — CONVERTED CEMR LAB: INR: 3.3

## 2010-04-09 ENCOUNTER — Ambulatory Visit: Payer: Self-pay | Admitting: Internal Medicine

## 2010-04-09 LAB — CONVERTED CEMR LAB: INR: 3.3

## 2010-04-10 ENCOUNTER — Ambulatory Visit (HOSPITAL_COMMUNITY): Admission: RE | Admit: 2010-04-10 | Discharge: 2010-04-10 | Payer: Self-pay | Admitting: Internal Medicine

## 2010-04-10 ENCOUNTER — Ambulatory Visit: Payer: Self-pay | Admitting: Internal Medicine

## 2010-04-10 LAB — CONVERTED CEMR LAB
ALT: 14 units/L (ref 0–53)
Alkaline Phosphatase: 76 units/L (ref 39–117)
Cholesterol: 110 mg/dL (ref 0–200)
Creatinine, Ser: 0.79 mg/dL (ref 0.40–1.50)
LDL Cholesterol: 69 mg/dL (ref 0–99)
Sodium: 138 meq/L (ref 135–145)
Total Bilirubin: 0.5 mg/dL (ref 0.3–1.2)
Total CHOL/HDL Ratio: 3.2
Total Protein: 7.3 g/dL (ref 6.0–8.3)
Triglycerides: 37 mg/dL (ref <150)
VLDL: 7 mg/dL (ref 0–40)

## 2010-04-20 ENCOUNTER — Ambulatory Visit: Payer: Self-pay | Admitting: Vascular Surgery

## 2010-04-23 ENCOUNTER — Ambulatory Visit: Payer: Self-pay | Admitting: Internal Medicine

## 2010-04-23 LAB — CONVERTED CEMR LAB
BUN: 15 mg/dL (ref 6–23)
Chloride: 103 meq/L (ref 96–112)
Creatinine, Ser: 0.74 mg/dL (ref 0.40–1.50)
Potassium: 3.5 meq/L (ref 3.5–5.3)

## 2010-05-07 ENCOUNTER — Ambulatory Visit: Payer: Self-pay | Admitting: Internal Medicine

## 2010-05-07 LAB — CONVERTED CEMR LAB: INR: 2.6

## 2010-05-09 ENCOUNTER — Encounter: Payer: Self-pay | Admitting: Internal Medicine

## 2010-06-04 ENCOUNTER — Ambulatory Visit: Payer: Self-pay | Admitting: Internal Medicine

## 2010-06-04 LAB — CONVERTED CEMR LAB

## 2010-07-02 ENCOUNTER — Ambulatory Visit: Payer: Self-pay | Admitting: Internal Medicine

## 2010-07-02 LAB — CONVERTED CEMR LAB: INR: 2.1

## 2010-07-30 ENCOUNTER — Ambulatory Visit: Payer: Self-pay | Admitting: Internal Medicine

## 2010-07-30 LAB — CONVERTED CEMR LAB: INR: 2.1

## 2010-08-16 ENCOUNTER — Ambulatory Visit: Admission: RE | Admit: 2010-08-16 | Discharge: 2010-08-16 | Payer: Self-pay | Source: Home / Self Care

## 2010-08-16 ENCOUNTER — Encounter: Payer: Self-pay | Admitting: Licensed Clinical Social Worker

## 2010-08-21 ENCOUNTER — Encounter: Payer: Self-pay | Admitting: Internal Medicine

## 2010-09-02 DIAGNOSIS — I6789 Other cerebrovascular disease: Secondary | ICD-10-CM

## 2010-09-02 DIAGNOSIS — Z7901 Long term (current) use of anticoagulants: Secondary | ICD-10-CM

## 2010-09-03 ENCOUNTER — Ambulatory Visit
Admission: RE | Admit: 2010-09-03 | Discharge: 2010-09-03 | Payer: Self-pay | Source: Home / Self Care | Attending: Internal Medicine | Admitting: Internal Medicine

## 2010-09-03 LAB — CONVERTED CEMR LAB: INR: 2.4

## 2010-09-09 ENCOUNTER — Emergency Department (HOSPITAL_COMMUNITY)
Admission: EM | Admit: 2010-09-09 | Discharge: 2010-09-09 | Payer: Self-pay | Source: Home / Self Care | Admitting: Emergency Medicine

## 2010-09-09 ENCOUNTER — Encounter: Payer: Self-pay | Admitting: Internal Medicine

## 2010-09-09 LAB — DIFFERENTIAL
Basophils Absolute: 0 10*3/uL (ref 0.0–0.1)
Basophils Relative: 1 % (ref 0–1)
Eosinophils Relative: 3 % (ref 0–5)
Lymphocytes Relative: 29 % (ref 12–46)
Neutro Abs: 2.3 10*3/uL (ref 1.7–7.7)

## 2010-09-09 LAB — CBC
HCT: 42.1 % (ref 39.0–52.0)
RDW: 13.9 % (ref 11.5–15.5)
WBC: 4.1 10*3/uL (ref 4.0–10.5)

## 2010-09-09 LAB — BASIC METABOLIC PANEL
BUN: 14 mg/dL (ref 6–23)
CO2: 28 mEq/L (ref 19–32)
Chloride: 97 mEq/L (ref 96–112)
Creatinine, Ser: 0.81 mg/dL (ref 0.4–1.5)
Glucose, Bld: 85 mg/dL (ref 70–99)
Potassium: 3.6 mEq/L (ref 3.5–5.1)

## 2010-09-09 LAB — URINALYSIS, ROUTINE W REFLEX MICROSCOPIC
Hgb urine dipstick: NEGATIVE
Ketones, ur: 15 mg/dL — AB
Protein, ur: NEGATIVE mg/dL
Urine Glucose, Fasting: NEGATIVE mg/dL
pH: 6.5 (ref 5.0–8.0)

## 2010-09-09 LAB — URINE MICROSCOPIC-ADD ON

## 2010-09-09 LAB — POCT CARDIAC MARKERS
CKMB, poc: <1 ng/mL — ABNORMAL LOW (ref 1.0–8.0)
Myoglobin, poc: 85 ng/mL (ref 12–200)

## 2010-09-09 LAB — PROTIME-INR: INR: 2.27 — ABNORMAL HIGH (ref 0.00–1.49)

## 2010-09-11 NOTE — Assessment & Plan Note (Signed)
Summary: LABS/VS  Anticoagulant Therapy Managed by: Barbera Setters. [name]  PharmD CACP PCP: Lars Mage MD [facility] Attending: Lowella Bandy MD Indication 1: TIA/CVA Indication 2: Aftercare long term use Anticoagulants V58.61,V58.83 Start date: 12/24/2002 Duration: Indefinite  Patient Assessment Reviewed by: Chancy Milroy PharmD  November 20, 2009 Medication review: verified warfarin dosage & schedule,verified previous prescription medications, verified doses & any changes, verified new medications, reviewed OTC medications, reviewed OTC health products-vitamins supplements etc Complications: none Dietary changes: none   Health status changes: none   Lifestyle changes: none   Recent/future hospitalizations: none   Recent/future procedures: none   Recent/future dental: none Patient Assessment Part 2:  Have you MISSED ANY DOSES or CHANGED TABLETS?  No missed Warfarin doses or changed tablets.  Have you had any BRUISING or BLEEDING ( nose or gum bleeds,blood in urine or stool)?  No reported bruising or bleeding in nose, gums, urine, stool.  Have you STARTED or STOPPED any MEDICATIONS, including OTC meds,herbals or supplements?  No other medications or herbal supplements were started or stopped.  Have you CHANGED your DIET, especially green vegetables,or ALCOHOL intake?  No changes in diet or alcohol intake.  Have you had any ILLNESSES or HOSPITALIZATIONS?  No reported illnesses or hospitalizations  Have you had any signs of CLOTTING?(chest discomfort,dizziness,shortness of breath,arms tingling,slurred speech,swelling or redness in leg)    No chest discomfort, dizziness, shortness of breath, tingling in arm, slurred speech, swelling, or redness in leg.     Treatment  Target INR: 2.0-3.0 INR: 4.5  Date: 11/20/2009 Regimen In:  28.5mg /week INR reflects regimen in: 4.5  New  Tablet strength: : 3mg  Regimen Out:     Sunday: 1 Tablet     Monday: 1 & 1/2 Tablet     Tuesday: 1 Tablet  Wednesday: 1 Tablet     Thursday: 1 & 1/2 Tablet      Friday: 1 Tablet     Saturday: 1 Tablet Total Weekly: 24.0mg /week mg  Next INR Due: 12/11/2009 Adjusted by: Barbera Setters. Alexandria Lodge III PharmD CACP   Return to anticoagulation clinic:  12/11/2009 Time of next visit: 1015  Hold:  1 Days     Allergies: No Known Drug Allergies

## 2010-09-11 NOTE — Assessment & Plan Note (Signed)
Summary: COU/CH  Anticoagulant Therapy Managed by: Robert Newton. Robert Newton  PharmD CACP PCP: Robert Mage MD [facility] Attending: Lowella Bandy MD Indication 1: TIA/CVA Indication 2: Aftercare long term use Anticoagulants V58.61,V58.83 Start date: 12/24/2002 Duration: Indefinite  Patient Assessment Reviewed by: Robert Newton PharmD  February 20, 2010 Medication review: verified warfarin dosage & schedule,verified previous prescription medications, verified doses & any changes, verified new medications, reviewed OTC medications, reviewed OTC health products-vitamins supplements etc Complications: none Dietary changes: none   Health status changes: none   Lifestyle changes: none   Recent/future hospitalizations: none   Recent/future procedures: none   Recent/future dental: none Patient Assessment Part 2:  Have you MISSED ANY DOSES or CHANGED TABLETS?  No missed Warfarin doses or changed tablets.  Have you had any BRUISING or BLEEDING ( nose or gum bleeds,blood in urine or stool)?  No reported bruising or bleeding in nose, gums, urine, stool.  Have you STARTED or STOPPED any MEDICATIONS, including OTC meds,herbals or supplements?  No other medications or herbal supplements were started or stopped.  Have you CHANGED your DIET, especially green vegetables,or ALCOHOL intake?  No changes in diet or alcohol intake.  Have you had any ILLNESSES or HOSPITALIZATIONS?  No reported illnesses or hospitalizations  Have you had any signs of CLOTTING?(chest discomfort,dizziness,shortness of breath,arms tingling,slurred speech,swelling or redness in leg)    No chest discomfort, dizziness, shortness of breath, tingling in arm, slurred speech, swelling, or redness in leg.     Treatment  Target INR: 2.0-3.0 INR: 3.4  Date: 02/20/2010 Regimen In:  22.5mg /week INR reflects regimen in: 3.4  New  Tablet strength: : 3mg  Regimen Out:     Sunday: 1 Tablet     Monday: 1 Tablet     Tuesday: 1 Tablet  Wednesday: 1 Tablet     Thursday: 1 Tablet      Friday: 1 Tablet     Saturday: 1 Tablet Total Weekly: 21.0mg /week mg  Next INR Due: 03/19/2010 Adjusted by: Robert Newton. Robert Newton PharmD CACP   Return to anticoagulation clinic:  03/19/2010 Time of next visit: 0900    Allergies: No Known Drug Allergies

## 2010-09-11 NOTE — Letter (Signed)
Summary: [facility] Letter: To Pt.  [facility] Letter: To Pt.   Imported By: Florinda Marker 10/25/2009 16:18:19  _____________________________________________________________________  External Attachment:    Type:   Image     Comment:   External Document

## 2010-09-11 NOTE — Assessment & Plan Note (Signed)
Summary: 915AM COU/CH  Anticoagulant Therapy Managed by: Barbera Setters. Robert Newton  PharmD CACP PCP: Lars Mage MD [facility] AttendingCoralee Pesa MD, Levada Schilling Indication 1: TIA/CVA Indication 2: Aftercare long term use Anticoagulants V58.61,V58.83 Start date: 12/24/2002 Duration: Indefinite  Patient Assessment Reviewed by: Chancy Milroy PharmD  May 07, 2010 Medication review: verified warfarin dosage & schedule,verified previous prescription medications, verified doses & any changes, verified new medications, reviewed OTC medications, reviewed OTC health products-vitamins supplements etc Complications: none Dietary changes: none   Health status changes: none   Lifestyle changes: none   Recent/future hospitalizations: none   Recent/future procedures: none   Recent/future dental: none Patient Assessment Part 2:  Have you MISSED ANY DOSES or CHANGED TABLETS?  No missed Warfarin doses or changed tablets.  Have you had any BRUISING or BLEEDING ( nose or gum bleeds,blood in urine or stool)?  No reported bruising or bleeding in nose, gums, urine, stool.  Have you STARTED or STOPPED any MEDICATIONS, including OTC meds,herbals or supplements?  No other medications or herbal supplements were started or stopped.  Have you CHANGED your DIET, especially green vegetables,or ALCOHOL intake?  No changes in diet or alcohol intake.  Have you had any ILLNESSES or HOSPITALIZATIONS?  No reported illnesses or hospitalizations  Have you had any signs of CLOTTING?(chest discomfort,dizziness,shortness of breath,arms tingling,slurred speech,swelling or redness in leg)    No chest discomfort, dizziness, shortness of breath, tingling in arm, slurred speech, swelling, or redness in leg.     Treatment  Target INR: 2.0-3.0 INR: 2.6  Date: 05/07/2010 Regimen In:  18.0mg /week INR reflects regimen in: 2.6  New  Tablet strength: : 3mg  Regimen Out:     Sunday: 1 Tablet     Monday: 1/2 Tablet     Tuesday: 1  Tablet     Wednesday: 1 Tablet     Thursday: 1/2 Tablet      Friday: 1 Tablet     Saturday: 1 Tablet Total Weekly: 18.0mg /week mg  Next INR Due: 06/04/2010 Adjusted by: Barbera Setters. Alexandria Lodge III PharmD CACP   Return to anticoagulation clinic:  06/04/2010 Time of next visit: 0930    Allergies: No Known Drug Allergies

## 2010-09-11 NOTE — Assessment & Plan Note (Signed)
Summary: COU/CH  Anticoagulant Therapy Managed by: Barbera Setters. [name]  PharmD CACP PCP: Lars Mage MD [facility] Attending: Blanch Media MD Indication 1: TIA/CVA Indication 2: Aftercare long term use Anticoagulants V58.61,V58.83 Start date: 12/24/2002 Duration: Indefinite  Patient Assessment Reviewed by: Chancy Milroy PharmD  October 13, 2009 Medication review: verified warfarin dosage & schedule,verified previous prescription medications, verified doses & any changes, verified new medications, reviewed OTC medications, reviewed OTC health products-vitamins supplements etc Complications: none Dietary changes: none   Health status changes: none   Lifestyle changes: none   Recent/future hospitalizations: none   Recent/future procedures: none   Recent/future dental: none Patient Assessment Part 2:  Have you MISSED ANY DOSES or CHANGED TABLETS?  No missed Warfarin doses or changed tablets.  Have you had any BRUISING or BLEEDING ( nose or gum bleeds,blood in urine or stool)?  No reported bruising or bleeding in nose, gums, urine, stool.  Have you STARTED or STOPPED any MEDICATIONS, including OTC meds,herbals or supplements?  No other medications or herbal supplements were started or stopped.  Have you CHANGED your DIET, especially green vegetables,or ALCOHOL intake?  No changes in diet or alcohol intake.  Have you had any ILLNESSES or HOSPITALIZATIONS?  No reported illnesses or hospitalizations  Have you had any signs of CLOTTING?(chest discomfort,dizziness,shortness of breath,arms tingling,slurred speech,swelling or redness in leg)    No chest discomfort, dizziness, shortness of breath, tingling in arm, slurred speech, swelling, or redness in leg.     Treatment  Target INR: 2.0-3.0 INR: 1.8  Date: 09/18/2009 Regimen In:  27.0mg /week INR reflects regimen in: 1.8  New  Tablet strength: : 3mg  Regimen Out:     Sunday: 1 & 1/2 Tablet     Monday: 1 & 1/2 Tablet     Tuesday: 1 &  1/2 Tablet     Wednesday: 1 Tablet     Thursday: 1 & 1/2 Tablet      Friday: 1 & 1/2 Tablet     Saturday: 1 & 1/2 Tablet Total Weekly: 30.0mg /week mg  Next INR Due: 10/23/2009 Adjusted by: Barbera Setters. Alexandria Lodge III PharmD CACP   Return to anticoagulation clinic:  10/23/2009    Allergies: No Known Drug Allergies

## 2010-09-11 NOTE — Progress Notes (Signed)
Summary: refill/ hla  Phone Note Refill Request Message from:  Fax from Pharmacy on February 26, 2010 12:27 PM  Refills Requested: Medication #1:  KLOR-CON M20 20 MEQ TBCR Take 1 tablet by mouth once a day   Dosage confirmed as above?Dosage Confirmed   Brand Name Necessary? No   Supply Requested: 3 months   Last Refilled: 6/23 last visit and labs 11/2009  Initial call taken by: Marin Roberts RN,  February 26, 2010 12:27 PM  Follow-up for Phone Call        Refill approved-nurse to complete Follow-up by: Lars Mage MD,  February 26, 2010 3:04 PM    Prescriptions: KLOR-CON M20 20 MEQ TBCR (POTASSIUM CHLORIDE CRYS CR) Take 1 tablet by mouth once a day  #30 x 6   Entered and Authorized by:   Lars Mage MD   Signed by:   Lars Mage MD on 02/26/2010   Method used:   Electronically to        [facility]  [address] [phone]* (retail)       [address]       [place], [place]  [id number]       Ph: [phone] or [phone]       Fax: [phone]   RxID:   [id number]

## 2010-09-11 NOTE — Assessment & Plan Note (Signed)
Summary: Robert Newton COMING IN AT 10:00AM/SB.  Anticoagulant Therapy Managed by: Barbera Setters. [name]  PharmD CACP PCP: Lars Mage MD [facility] Attending: Darl Pikes, Beth Indication 1: TIA/CVA Indication 2: Aftercare long term use Anticoagulants V58.61,V58.83 Start date: 12/24/2002 Duration: Indefinite  Robert Newton Assessment Reviewed by: Chancy Milroy PharmD  January 22, 2010 Medication review: verified warfarin dosage & schedule,verified previous prescription medications, verified doses & any changes, verified new medications, reviewed OTC medications, reviewed OTC health products-vitamins supplements etc Complications: none Dietary changes: none   Health status changes: none   Lifestyle changes: none   Recent/future hospitalizations: none   Recent/future procedures: none   Recent/future dental: none Robert Newton Assessment Part 2:  Have you MISSED ANY DOSES or CHANGED TABLETS?  No missed Warfarin doses or changed tablets.  Have you had any BRUISING or BLEEDING ( nose or gum bleeds,blood in urine or stool)?  No reported bruising or bleeding in nose, gums, urine, stool.  Have you STARTED or STOPPED any MEDICATIONS, including OTC meds,herbals or supplements?  No other medications or herbal supplements were started or stopped.  Have you CHANGED your DIET, especially green vegetables,or ALCOHOL intake?  No changes in diet or alcohol intake.  Have you had any ILLNESSES or HOSPITALIZATIONS?  No reported illnesses or hospitalizations  Have you had any signs of CLOTTING?(chest discomfort,dizziness,shortness of breath,arms tingling,slurred speech,swelling or redness in leg)    No chest discomfort, dizziness, shortness of breath, tingling in arm, slurred speech, swelling, or redness in leg.     Treatment  Target INR: 2.0-3.0 INR: 3.1  Date: 01/22/2010 Regimen In:  24.0mg /week INR reflects regimen in: 3.1  New  Tablet strength: : 3mg  Regimen Out:     Sunday: 1 Tablet     Monday: 1 Tablet  Tuesday: 1 Tablet     Wednesday: 1 & 1/2 Tablet     Thursday: 1 Tablet      Friday: 1 Tablet     Saturday: 1 Tablet Total Weekly: 22.5mg /week mg  Next INR Due: 02/19/2010 Adjusted by: Barbera Setters. Alexandria Lodge III PharmD CACP   Return to anticoagulation clinic:  02/19/2010 Time of next visit: 0845    Allergies: No Known Drug Allergies

## 2010-09-11 NOTE — Assessment & Plan Note (Signed)
Summary: COU/CH  Anticoagulant Therapy Managed by: Barbera Setters. [name]  PharmD CACP PCP: Lars Mage MD [facility] AttendingOnalee Hua MD, Manrique Indication 1: TIA/CVA Indication 2: Aftercare long term use Anticoagulants V58.61,V58.83 Start date: 12/24/2002 Duration: Indefinite  Patient Assessment Reviewed by: Chancy Milroy PharmD  June 04, 2010 Medication review: verified warfarin dosage & schedule,verified previous prescription medications, verified doses & any changes, verified new medications, reviewed OTC medications, reviewed OTC health products-vitamins supplements etc Complications: none Dietary changes: none   Health status changes: none   Lifestyle changes: none   Recent/future hospitalizations: none   Recent/future procedures: none   Recent/future dental: none Patient Assessment Part 2:  Have you MISSED ANY DOSES or CHANGED TABLETS?  No missed Warfarin doses or changed tablets.  Have you had any BRUISING or BLEEDING ( nose or gum bleeds,blood in urine or stool)?  No reported bruising or bleeding in nose, gums, urine, stool.  Have you STARTED or STOPPED any MEDICATIONS, including OTC meds,herbals or supplements?  No other medications or herbal supplements were started or stopped.  Have you CHANGED your DIET, especially green vegetables,or ALCOHOL intake?  No changes in diet or alcohol intake.  Have you had any ILLNESSES or HOSPITALIZATIONS?  No reported illnesses or hospitalizations  Have you had any signs of CLOTTING?(chest discomfort,dizziness,shortness of breath,arms tingling,slurred speech,swelling or redness in leg)    No chest discomfort, dizziness, shortness of breath, tingling in arm, slurred speech, swelling, or redness in leg.     Treatment  Target INR: 2.0-3.0 INR: 2.9  Date: 06/04/2010 Regimen In:  18.0mg /week INR reflects regimen in: 2.9  New  Tablet strength: : 3mg  Regimen Out:     Sunday: 1 Tablet     Monday: 1/2 Tablet     Tuesday: 1 Tablet  Wednesday: 1/2 Tablet     Thursday: 1 Tablet      Friday: 1/2 Tablet     Saturday: 1 Tablet Total Weekly: 16.5mg /week mg  Next INR Due: 07/02/2010 Adjusted by: Barbera Setters. Alexandria Lodge III PharmD CACP   Return to anticoagulation clinic:  07/02/2010 Time of next visit: 0900    Allergies: No Known Drug Allergies

## 2010-09-11 NOTE — Assessment & Plan Note (Signed)
Summary: COU/APPT 9:15/VS  Anticoagulant Therapy Managed by: Barbera Setters. Alexandria Lodge III  PharmD CACP PCP: Marinda Elk MD [facility] Attending: Josem Kaufmann MD, Lawrence Indication 1: TIA/CVA Indication 2: Aftercare long term use Anticoagulants V58.61,V58.83 Start date: 12/24/2002 Duration: Indefinite  Patient Assessment Reviewed by: Chancy Milroy PharmD  August 21, 2009 Medication review: verified warfarin dosage & schedule,verified previous prescription medications, verified doses & any changes, verified new medications, reviewed OTC medications, reviewed OTC health products-vitamins supplements etc Complications: none Dietary changes: none   Health status changes: none   Lifestyle changes: none   Recent/future hospitalizations: none   Recent/future procedures: none   Recent/future dental: none Patient Assessment Part 2:  Have you MISSED ANY DOSES or CHANGED TABLETS?  No missed Warfarin doses or changed tablets.  Have you had any BRUISING or BLEEDING ( nose or gum bleeds,blood in urine or stool)?  No reported bruising or bleeding in nose, gums, urine, stool.  Have you STARTED or STOPPED any MEDICATIONS, including OTC meds,herbals or supplements?  No other medications or herbal supplements were started or stopped.  Have you CHANGED your DIET, especially green vegetables,or ALCOHOL intake?  No changes in diet or alcohol intake.  Have you had any ILLNESSES or HOSPITALIZATIONS?  No reported illnesses or hospitalizations  Have you had any signs of CLOTTING?(chest discomfort,dizziness,shortness of breath,arms tingling,slurred speech,swelling or redness in leg)    No chest discomfort, dizziness, shortness of breath, tingling in arm, slurred speech, swelling, or redness in leg.     Treatment  Target INR: 2.0-3.0 INR: 2.2  Date: 08/21/2009 Regimen In:  25.5mg /week INR reflects regimen in: 2.2  New  Tablet strength: : 3mg  Regimen Out:     Sunday: 1 Tablet     Monday: 1 & 1/2 Tablet  Tuesday: 1 Tablet     Wednesday: 1 & 1/2 Tablet     Thursday: 1 Tablet      Friday: 1 & 1/2 Tablet     Saturday: 1 Tablet Total Weekly: 25.5mg /week mg  Next INR Due: 09/25/2009 Adjusted by: Barbera Setters. Alexandria Lodge III PharmD CACP   Return to anticoagulation clinic:  09/25/2009 Time of next visit: 0900    Allergies: No Known Drug Allergies

## 2010-09-11 NOTE — Assessment & Plan Note (Signed)
Summary: ACUTE/GARG/PAIN IN HANDS/CH   Vital Signs:  Patient profile:   65 year old male Height:      73 inches (185.42 cm) Weight:      168.6 pounds (76.64 kg) BMI:     22.32 Temp:     97.6 degrees F (36.44 degrees C) oral Pulse rate:   76 / minute BP sitting:   145 / 90  (right arm)  Vitals Entered By: Stanton Kidney Ditzler RN (April 10, 2010 8:48 AM) CC: thumb pain Is Patient Diabetic? No Pain Assessment Patient in pain? yes     Location: both thumbs Intensity: 0-9 Type: sharp Onset of pain  past 4 weeks Nutritional Status BMI of 19 -24 = normal Nutritional Status Detail appetite good  Have you ever been in a relationship where you felt threatened, hurt or afraid?denies   Does patient need assistance? Functional Status Self care Ambulation Impaired:Risk for fall Comments Uses a cane. Discuss thumb pain.   Primary Care Provider:  Lars Mage MD  CC:  thumb pain.  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   Patient currently denies SOB, Denies CP, Denies fever, chills, nausea, vomiting, diarrhea, constipation and otherwise doing well and denies any other complaints.      Depression History:      The patient denies a depressed mood most of the day and a diminished interest in his usual daily activities.         Preventive  Screening-Counseling & Management  Alcohol-Tobacco     Smoking Status: current     Smoking Cessation Counseling: yes     Packs/Day: 1. pp 1.5 days     Year Started: 1960     Passive Smoke Exposure: no  Caffeine-Diet-Exercise     Caffeine use/day: 0     Does Patient Exercise: yes     Type of exercise: walking     Times/week: 5  Current Medications (verified): 1)  Colace 100 Mg Caps (Docusate Sodium) .... Take One Capsule By Mouth Once Daily 2)  Aspir-Low 81 Mg Tbec (Aspirin) .... Take 1 Tablet By Mouth Once A Day 3)  Niacin Cr 250 Mg  Cpcr (Niacin) .... Take 1 Tablet By Mouth Once A Day 4)  Ensure Vanilla .... Take 1 Can By Mouth Three Times A Day With Meals 5)  Warfarin Sodium 3 Mg Tabs (Warfarin Sodium) .... Take As Directed 6)  Lipitor 40 Mg Tabs (Atorvastatin Calcium) .... Once Daily At Night. 7)  Omeprazole 40 Mg Cpdr (Omeprazole) .... Take 1 Tablet Daily By Mouth At Bedtime 8)  Amlodipine Besylate 2.5 Mg Tabs (Amlodipine Besylate) .... Take 1 Tablet By Mouth Once A Day  Allergies (verified): No Known Drug Allergies  Social History: Packs/Day:  1. pp 1.5 days  Review of Systems       as per HPI  Physical Exam  General:  alert, well-developed, and cooperative to examination.    Lungs:  normal respiratory effort and normal breath sounds.  normal respiratory  effort and normal breath sounds.   Heart:  normal rate and regular rhythm.  normal rate and regular rhythm.   Abdomen:  soft and non-tender.  soft and non-tender.   Msk:  right thumb not swolen, cracking on movement with full ROM.  Extremities:  no edema.  Neurologic:  aox3   Impression & Recommendations:  Problem # 1:  THUMB PAIN, RIGHT (ICD-729.5) Assessment Comment Only DDX trauma vs OA vs less likely immune related. Will get XR.  if positive will workup further.   Problem # 2:  TOBACCO ABUSE (ICD-305.1) Assessment: Comment Only Patient was counseled on smoking cessation strategies including medications  and behavior modification options. Patient said she was not ready to stop smoking at this time.    Problem # 3:  HYPERTENSION (ICD-401.9) Assessment: Comment Only start amlodipine given that his BP is up and down 2/2 to smoking. CCK may help in reducing fluxes in BP.   His updated medication list for this problem includes:    Amlodipine Besylate 2.5 Mg Tabs (Amlodipine besylate) .Marland Kitchen... Take 1 tablet by mouth once a day  Problem # 4:  HYPERLIPIDEMIA (ICD-272.4) Assessment: Comment Only tolerated meds well, will check FLP today.   His updated medication list for this problem includes:    Niacin Cr 250 Mg Cpcr (Niacin) .Marland Kitchen... Take 1 tablet by mouth once a day    Lipitor 40 Mg Tabs (Atorvastatin calcium) ..... Once daily at night.  Orders: T-Lipid Profile [id number])  Labs Reviewed: SGOT: 18 (10/16/2009)   SGPT: 13 (10/16/2009)  Prior 10 Yr Risk Heart Disease: 11 % (02/27/2007)   HDL:39 (08/29/2009), 36 (01/22/2008)  LDL:152 (08/29/2009), 74 (36/64/4034)  Chol:200 (08/29/2009), 117 (01/22/2008)  Trig:46 (08/29/2009), 37 (01/22/2008)  Problem # 5:  HYPOKALEMIA, MILD (ICD-276.8) Assessment: Comment Only 2/2 to HCTZ which the patient is nolonger on. will hold and rechedck BMET in 2 weeks for hypoK.  Complete Medication List: 1)  Colace 100 Mg Caps (Docusate sodium) .... Take one capsule by mouth once daily 2)  Aspir-low 81 Mg Tbec (Aspirin) .... Take 1 tablet by mouth once a day 3)  Niacin Cr 250 Mg Cpcr (Niacin) .... Take 1 tablet by mouth once a day 4)  Ensure Vanilla  .... Take 1 can by mouth three times a day with meals 5)  Warfarin Sodium 3 Mg Tabs (Warfarin sodium) .... Take as directed 6)  Lipitor 40 Mg Tabs (Atorvastatin calcium) .... Once daily at night. 7)  Omeprazole 40 Mg Cpdr (Omeprazole) .... Take 1 tablet daily by mouth at bedtime 8)  Amlodipine Besylate 2.5 Mg Tabs (Amlodipine besylate) .... Take 1 tablet by mouth once a day  Other Orders: Diagnostic  X-Ray/Fluoroscopy (Diagnostic X-Ray/Flu) T-Comprehensive Metabolic Panel ([id number])  Patient Instructions: 1)  Please schedule a follow-up appointment in 2 weeks for BMET. 2)  Please stop taking your Kchlor 3)  Please start taking your new blood pressure pill, Amlodipine. Prescriptions: AMLODIPINE BESYLATE 2.5 MG TABS (AMLODIPINE BESYLATE) Take 1 tablet by mouth once a day  #30 x 3   Entered and Authorized by:   Darnelle Maffucci MD   Signed by:   Darnelle Maffucci MD on 04/10/2010   Method used:   Electronically to        [facility] [address] [phone]* (retail)       [address]       [place], [place]  [id number]       Ph: [phone]  or [phone]       Fax: [phone]   RxID:   [id number]  Process Orders Check Orders Results:     [facility]: Order checked:       NOT AUTHORIZED TO ORDER Tests Sent for requisitioning (April 10, 2010 11:13 AM):     04/10/2010: [facility] -- T-Comprehensive Metabolic Panel [[id number]] (signed)     04/10/2010: [facility] -- T-Lipid Profile [id number] (signed)     Prevention & Chronic Care Immunizations   Influenza vaccine: Fluvax 3+  (10/12/2009)   Influenza vaccine deferral: Refused  (08/24/2009)    Tetanus booster: 10/12/2009: Td   Td booster deferral: Deferred  (08/24/2009)    Pneumococcal vaccine: Not documented    H. zoster vaccine: Not documented   H. zoster vaccine deferral: Deferred  (11/08/2009)  Colorectal Screening   Hemoccult: Not documented   Hemoccult action/deferral: Not indicated  (08/24/2009)    Colonoscopy: Normal  (12/10/2004)   Colonoscopy due: 09/01/2014  Other Screening   PSA: 0.73  (04/22/2008)   PSA action/deferral: Not indicated  (08/24/2009)   Smoking status: current  (04/10/2010)   Smoking cessation counseling: yes  (04/10/2010)  Lipids   Total Cholesterol: 200  (08/29/2009)   Lipid panel action/deferral:  Lipid Panel ordered   LDL: 152  (08/29/2009)   LDL Direct: Not documented   HDL: 39  (08/29/2009)   Triglycerides: 46  (08/29/2009)    SGOT (AST): 18  (10/16/2009)   BMP action: Ordered   SGPT (ALT): 13  (10/16/2009) CMP ordered    Alkaline phosphatase: 85  (10/16/2009)   Total bilirubin: 0.5  (10/16/2009)    Lipid flowsheet reviewed?: Yes   Progress toward LDL goal: At goal  Hypertension   Last Blood Pressure: 145 / 90  (04/10/2010)   Serum creatinine: 0.73  (11/28/2009)   BMP action: Ordered   Serum potassium 4.0  (11/28/2009) CMP ordered     Hypertension flowsheet reviewed?: Yes   Progress toward BP goal: Unchanged  Self-Management Support :   Personal Goals (by the next clinic visit) :      Personal blood pressure goal: 140/90  (10/12/2009)     Personal LDL goal: 100  (10/12/2009)    Patient will work on the following items until the next clinic visit to reach self-care goals:     Medications and monitoring: take my medicines every day, check my blood pressure, bring all of my medications to every visit, weigh myself weekly  (04/10/2010)     Eating: eat more vegetables, use fresh or frozen vegetables, eat foods that are low in salt, eat fruit for snacks and desserts, limit or avoid alcohol  (04/10/2010)     Activity: take a 30 minute walk every day  (04/10/2010)    Hypertension self-management support: Written self-care plan, Education handout, Resources for patients handout  (04/10/2010)   Hypertension self-care plan printed.   Hypertension education handout printed    Lipid self-management support: Written self-care plan, Education handout, Resources for patients handout  (04/10/2010)   Lipid self-care plan printed.   Lipid education handout printed      Resource handout printed.  Appended Document: ACUTE/GARG/PAIN IN HANDS/CH    Clinical Lists Changes  Orders: Added new Service order of Est. Patient Level IV ([id number]) - Signed      Appended Document:  bmet order//kg    Clinical Lists Changes  Orders: Added new Test order of T-Basic Metabolic Panel [id number]) - Signed      Process  Orders Check Orders Results:     [facility]: Check successful Order queued for requisitioning for [facility]: April 23, 2010 8:59 AM  Tests Sent for requisitioning (April 23, 2010 2:26 PM):     04/23/2010: [facility] -- T-Basic Metabolic Panel [phone] (signed)

## 2010-09-11 NOTE — Assessment & Plan Note (Signed)
Summary: est-ck/fu/meds/cfb   Vital Signs:  Patient profile:   65 year old male Height:      73 inches (185.42 cm) Weight:      165.6 pounds (74.41 kg) BMI:     21.93 Temp:     97.0 degrees F (36.11 degrees C) oral Pulse rate:   100 / minute BP sitting:   153 / 86  (right arm) Cuff size:   regular  Vitals Entered By: Theotis Barrio NT II (November 08, 2009 9:59 AM) CC: FOLLOW UP APPT FOR ABD PAIN ( NO PAIN TODAY)  Is Patient Diabetic? No Pain Assessment Patient in pain? no      Nutritional Status BMI of 19 -24 = normal  Have you ever been in a relationship where you felt threatened, hurt or afraid?No   Does patient need assistance? Functional Status Self care Ambulation Impaired:Risk for fall Comments ?IMPAIRED RISK FOR FALL (STROKE  PATIENT)   Primary Care Provider:  Lars Mage MD  CC:  FOLLOW UP APPT FOR ABD PAIN ( NO PAIN TODAY) .  History of Present Illness: Robert Newton is a 65 year old Male with PMH/problems as outlined in the EMR, who presents to the [facility] for follow up on his abdominal pain. A few weeks ago he had been seen for NV and abdominal pain that was conservatively managed. He was given PPI, he does have h/o PUD. His pain went away after a few days and has been asymptomatic. He doesn't have any new complaints today. He had a normal colonoscopy in 2006. He wants [place] to fill a prescription on ensure today.   Preventive Screening-Counseling & Management  Alcohol-Tobacco     Smoking Status: current     Smoking Cessation Counseling: yes     Packs/Day: 0.5     Year Started: 1960     Passive Smoke Exposure: no  Caffeine-Diet-Exercise     Caffeine use/day: 0     Does Patient Exercise: yes     Type of exercise: walking     Times/week: 5  Current Medications (verified): 1)  Colace 100 Mg Caps (Docusate Sodium) .... Take One Capsule By Mouth Once Daily 2)  Aspir-Low 81 Mg Tbec (Aspirin) .... Take 1 Tablet By Mouth Once A Day 3)  Klor-Con M20 20 Meq Tbcr  (Potassium Chloride Crys Cr) .... Take 1 Tablet By Mouth Once A Day 4)  Niacin Cr 250 Mg  Cpcr (Niacin) .... Take 1 Tablet By Mouth Once A Day 5)  Ensure Vanilla .... Take 1 Can By Mouth Three Times A Day With Meals 6)  Warfarin Sodium 3 Mg Tabs (Warfarin Sodium) .... Take As Directed 7)  Lipitor 40 Mg Tabs (Atorvastatin Calcium) .... Once Daily At Night. 8)  Omeprazole 40 Mg Cpdr (Omeprazole) .... Take 1 Tablet Daily By Mouth At Bedtime  Allergies (verified): No Known Drug Allergies  Past History:  Social History: Last updated: 05/23/2008 Retired Married, wife has multiple chronic medical issues([facility] patient) Current Smoker Alcohol use-no  Risk Factors: Caffeine Use: 0 (11/08/2009) Exercise: yes (11/08/2009)  Risk Factors: Smoking Status: current (11/08/2009) Packs/Day: 0.5 (11/08/2009) Passive Smoke Exposure: no (11/08/2009)  Past Medical History: Low back pain Stroke- 2004 Current Problems:  TOBACCO ABUSE (ICD-305.1) HYPERTENSION (ICD-401.9) HYPERLIPIDEMIA (ICD-272.4) COUMADIN THERAPY (ICD-V58.61) 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  alert and well-developed.   Lungs:  normal respiratory effort and normal breath sounds.  normal respiratory effort and normal breath sounds.   Heart:  normal rate and regular rhythm.  normal rate and regular rhythm.   Abdomen:  soft and non-tender.  soft and non-tender.   Pulses:  normal peripheral pulses  Extremities:  no cyanosis, clubbing or edema  Neurologic:  no new focal deficit, weak  on left   Impression & Recommendations:  Problem # 1:  ABDOMINAL PAIN RIGHT LOWER QUADRANT (ICD-789.03) Resolved. He is uptodate on his colonoscopy. His weight loss is not a new problem and has gained some lately. Will continue to keep an eye on this, if it comes back, will get a CT.   Problem # 2:  HYPERTENSION (ICD-401.9) High bp noted today, but was normal on previous exams. Recheck in one month, if high, will start meds.   Problem # 3:  HYPERLIPIDEMIA (ICD-272.4) On lipitor and niacin, will get repeat FLP on follow up.   His updated medication list for this problem includes:    Niacin Cr 250 Mg Cpcr (Niacin) .Marland Kitchen... Take 1 tablet by mouth once a day    Lipitor 40 Mg Tabs (Atorvastatin calcium) ..... Once daily at night.  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 Will see Dr. Alexandria Lodge for coumadin.  His updated medication list for this problem includes:    Aspir-low 81 Mg Tbec (Aspirin) .Marland Kitchen... Take 1 tablet by mouth once a day    Warfarin Sodium 3 Mg Tabs (Warfarin sodium) .Marland Kitchen... Take as directed  Complete Medication List: 1)  Colace 100 Mg Caps (Docusate sodium) .... Take one capsule by mouth once daily 2)  Aspir-low 81 Mg Tbec (Aspirin) .... Take 1 tablet by mouth once a day 3)  Klor-con M20 20 Meq Tbcr (Potassium chloride crys cr) .... Take 1 tablet by mouth once a day 4)  Niacin Cr 250 Mg Cpcr (Niacin) .... Take 1 tablet by mouth once a day 5)  Ensure Vanilla  .... Take 1 can by mouth three times a day with meals 6)  Warfarin Sodium 3 Mg Tabs (Warfarin sodium) .... Take as directed 7)  Lipitor 40 Mg Tabs (Atorvastatin calcium) .... Once daily at night. 8)  Omeprazole 40 Mg Cpdr (Omeprazole) .... Take 1 tablet daily by mouth at bedtime  Patient Instructions: 1)  Please schedule a follow-up appointment in 1 month.  Prevention & Chronic Care Immunizations   Influenza vaccine: Fluvax 3+  (10/12/2009)   Influenza vaccine  deferral: Refused  (08/24/2009)    Tetanus booster: 10/12/2009: Td   Td booster deferral: Deferred  (08/24/2009)    Pneumococcal vaccine: Not documented    H. zoster vaccine: Not documented   H. zoster vaccine deferral: Deferred  (11/08/2009)  Colorectal Screening   Hemoccult: Not documented   Hemoccult action/deferral: Not indicated  (08/24/2009)    Colonoscopy: Normal  (12/10/2004)   Colonoscopy due: 09/01/2014  Other Screening   PSA: 0.73  (04/22/2008)   PSA action/deferral: Not indicated  (08/24/2009)   Smoking status: current  (11/08/2009)   Smoking cessation counseling: yes  (11/08/2009)  Lipids   Total Cholesterol: 200  (08/29/2009)   Lipid panel action/deferral: Lipid Panel ordered   LDL: 152  (08/29/2009)  LDL Direct: Not documented   HDL: 39  (08/29/2009)   Triglycerides: 46  (08/29/2009)    SGOT (AST): 18  (10/16/2009)   BMP action: Ordered   SGPT (ALT): 13  (10/16/2009)   Alkaline phosphatase: 85  (10/16/2009)   Total bilirubin: 0.5  (10/16/2009)    Lipid flowsheet reviewed?: Yes   Progress toward LDL goal: Unchanged  Hypertension   Last Blood Pressure: 153 / 86  (11/08/2009)   Serum creatinine: 0.75  (10/16/2009)   BMP action: Ordered   Serum potassium 4.2  (10/16/2009)    Hypertension flowsheet reviewed?: Yes   Progress toward BP goal: Deteriorated  Self-Management Support :   Personal Goals (by the next clinic visit) :      Personal blood pressure goal: 140/90  (10/12/2009)     Personal LDL goal: 100  (10/12/2009)    Patient will work on the following items until the next clinic visit to reach self-care goals:     Medications and monitoring: take my medicines every day, bring all of my medications to every visit  (11/08/2009)     Eating: eat more vegetables, use fresh or frozen vegetables, eat baked foods instead of fried foods, limit or avoid alcohol  (11/08/2009)    Hypertension self-management support: Education handout, Resources for  patients handout  (10/16/2009)    Lipid self-management support: Education handout, Resources for patients handout  (10/16/2009)     Self-management comments: PATIENT WALKS A LITTLE ( STROKE PATIENT)    Patient Instructions: 1)  Please schedule a follow-up appointment in 1 month.

## 2010-09-11 NOTE — Assessment & Plan Note (Signed)
Summary: 9AM COU APPT/CH  Anticoagulant Therapy Managed by: Barbera Setters. Robert Newton  PharmD CACP PCP: Lars Mage MD [facility] Attending: Margarito Liner MD Indication 1: TIA/CVA Indication 2: Aftercare long term use Anticoagulants V58.61,V58.83 Start date: 12/24/2002 Duration: Indefinite  Patient Assessment Reviewed by: Chancy Milroy PharmD  July 02, 2010 Medication review: verified warfarin dosage & schedule,verified previous prescription medications, verified doses & any changes, verified new medications, reviewed OTC medications, reviewed OTC health products-vitamins supplements etc Complications: none Dietary changes: none   Health status changes: none   Lifestyle changes: none   Recent/future hospitalizations: none   Recent/future procedures: none   Recent/future dental: none Patient Assessment Part 2:  Have you MISSED ANY DOSES or CHANGED TABLETS?  No missed Warfarin doses or changed tablets.  Have you had any BRUISING or BLEEDING ( nose or gum bleeds,blood in urine or stool)?  No reported bruising or bleeding in nose, gums, urine, stool.  Have you STARTED or STOPPED any MEDICATIONS, including OTC meds,herbals or supplements?  No other medications or herbal supplements were started or stopped.  Have you CHANGED your DIET, especially green vegetables,or ALCOHOL intake?  No changes in diet or alcohol intake.  Have you had any ILLNESSES or HOSPITALIZATIONS?  No reported illnesses or hospitalizations  Have you had any signs of CLOTTING?(chest discomfort,dizziness,shortness of breath,arms tingling,slurred speech,swelling or redness in leg)    No chest discomfort, dizziness, shortness of breath, tingling in arm, slurred speech, swelling, or redness in leg.     Treatment  Target INR: 2.0-3.0 INR: 2.1  Date: 07/02/2010 Regimen In:  16.5mg /week INR reflects regimen in: 2.1  New  Tablet strength: : 3mg  Regimen Out:     Sunday: 1 Tablet     Monday: 1/2 Tablet     Tuesday: 1  Tablet     Wednesday: 1 Tablet     Thursday: 1/2 Tablet      Friday: 1 Tablet     Saturday: 1 Tablet Total Weekly: 18.0mg /week mg  Next INR Due: 07/30/2010 Adjusted by: Barbera Setters. Alexandria Lodge III PharmD CACP   Return to anticoagulation clinic:  07/30/2010 Time of next visit: 0900    Allergies: No Known Drug Allergies

## 2010-09-11 NOTE — Assessment & Plan Note (Signed)
Summary: est-stomach pain/can't sleep/nausea/cfb   Vital Signs:  Patient profile:   65 year old male Height:      73 inches (185.42 cm) Weight:      161.1 pounds (73.23 kg) BMI:     21.33 Temp:     98.6 degrees F (37.00 degrees C) oral Pulse rate:   71 / minute BP sitting:   118 / 81  (left arm) Cuff size:   regular  Vitals Entered By: Krystal Eaton Duncan Dull) (October 12, 2009 1:38 PM) CC: pt c/o abd pain off and on x 1wk, only at night Is Patient Diabetic? No   Primary Care Provider:  Lars Mage MD  CC:  pt c/o abd pain off and on x 1wk and only at night.  History of Present Illness: 65 yo male with PMH as described in EMR most significant for CVA in 2004 and on Chronic coumadin therapy for last 6 years is here today for a routine office visit. He was admitted in Sep 2004 with this stroke which was most likely 2/2 plaque rupture as he has a right carotid artery with 80-99% stenosed. He complains of abdominal pain for last 5 days in his right lower abdomen. The pain comes at night and 8/10 at its worse and 0/10 right now, a/w nausea and vomiting. No c/o any change in the colour of his stools or urine. No urgency or frequency of urination noted.  He is unable to sleep well due to this pain. This is the first time that he is having this pain. No other complaints.  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 Take One Capsule By Mouth Once Daily 2)  Aspir-Low 81 Mg Tbec (Aspirin) .... Take 1 Tablet By Mouth Once A Day 3)  Klor-Con M20 20 Meq Tbcr (Potassium Chloride Crys Cr) .... Take 1 Tablet By Mouth Once A Day 4)  Niacin Cr 250 Mg  Cpcr (Niacin) .... Take 1 Tablet By Mouth Once A Day 5)  Ensure Vanilla .... Take 1 Can By Mouth Three Times A Day With Meals 6)  Warfarin Sodium 3 Mg Tabs (Warfarin Sodium) .... Take As Directed  Current Medications (verified): 1)  Colace 100 Mg Caps (Docusate Sodium) .... Take One Capsule By Mouth Once Daily 2)  Aspir-Low 81 Mg Tbec (Aspirin) .... Take 1 Tablet By Mouth Once A Day 3)  Klor-Con M20 20 Meq Tbcr (Potassium Chloride Crys Cr) .... Take 1 Tablet By Mouth Once A Day 4)  Niacin Cr 250 Mg  Cpcr (Niacin) .... Take 1 Tablet  By Mouth Once A Day 5)  Ensure Vanilla .... Take 1 Can By Mouth Three Times A Day With Meals 6)  Warfarin Sodium 3 Mg Tabs (Warfarin Sodium) .... Take As Directed 7)  Lipitor 40 Mg Tabs (Atorvastatin Calcium) .... Once Daily At Night.  Allergies (verified): No Known Drug Allergies  Past History:  Past Medical History: Last updated: 07/12/2008 Hyperlipidemia Hypertension Low back pain Stroke- 2004  Social History: Last updated: 05/23/2008 Retired Married, wife has multiple chronic medical issues([facility] patient) Current Smoker Alcohol use-no  Risk Factors: Caffeine Use: 0 (08/24/2009) Exercise: yes (08/24/2009)  Risk Factors: Smoking Status: current (10/12/2009) Packs/Day: 0.5 (10/12/2009) Passive Smoke Exposure: no (10/12/2009)  Social History: Packs/Day:  0.5  Review of Systems      See HPI  Physical  Exam  Additional Exam:  Gen: AOx3, in no acute distress, in wheel chair Eyes: PERRL, EOMI ENT:MMM, No erythema noted in posterior pharynx Neck: No JVD, No LAP Chest: CTAB with  good respiratory effort CVS: regular rhythmic rate, NO M/R/G, S1 S2 normal Abdo: soft,ND, BS+x4, Non tender and No hepatosplenomegaly EXT: No odema noted Neuro: left sided hemiperesis Skin: no rashes noted.    Impression & Recommendations:  Problem # 1:  ABDOMINAL PAIN RIGHT LOWER QUADRANT (ICD-789.03) Patient is having an abdominalpain which is new, at night only, 8/10 at its worse and 0/10 right now. He does not have any pain right now. He does not complain of any GI or GU complaints at this time. No change in colour of his urine/stool. He does take all his meds at night. The plan will be to take tylenol when it hurts and follow up in 1 week if your pain is not better. We could not figure out the origin of this pain.  Problem # 2:  HYPERLIPIDEMIA (ICD-272.4)  Will start him on lipitor since his LDL is 152. His updated medication list for this problem includes:    Niacin Cr 250 Mg Cpcr (Niacin) .Marland Kitchen... Take 1 tablet by mouth once a day    Lipitor 40 Mg Tabs (Atorvastatin calcium) ..... Once daily at night.  Labs Reviewed: SGOT: 14 (08/29/2009)   SGPT: 10 (08/29/2009)  Prior 10 Yr Risk Heart Disease: 11 % (02/27/2007)   HDL:39 (08/29/2009), 36 (01/22/2008)  LDL:152 (08/29/2009), 74 (04/54/0981)  Chol:200 (08/29/2009), 117 (01/22/2008)  Trig:46 (08/29/2009), 37 (01/22/2008)  Problem # 3:  CAROTID ARTERY STENOSIS, RIGHT (ICD-433.10) Assessment: Unchanged Patient is being followed by Neurosurgery and they denied doing CEA at this time and plan to continue on coumadin therapy. His updated medication list for this problem includes:    Aspir-low 81 Mg Tbec (Aspirin) .Marland Kitchen... Take 1 tablet by mouth once a day    Warfarin Sodium 3 Mg Tabs (Warfarin sodium) .Marland Kitchen... Take as directed  Problem # 4:  COUMADIN THERAPY  (ICD-V58.61) Assessment: Unchanged Patient has an appointment on monday but is requesting to draw PT test today for the ease of not come toclinic again. Orders: [facility] (in-house) [phone])  Problem # 5:  TOBACCO ABUSE (ICD-305.1) Assessment: Unchanged Denies any wish to quit at this time. Encouraged smoking cessation and discussed different methods for smoking cessation.   Problem # 6:  HYPERTENSION (ICD-401.9) Assessment: Improved Well controlled at this time. BP today: 118/81 Prior BP: 137/75 (08/24/2009)  Prior 10 Yr Risk Heart Disease: 11 % (02/27/2007)  Labs Reviewed: K+: 4.1 (08/29/2009) Creat: : 0.74 (08/29/2009)   Chol: 200 (08/29/2009)   HDL: 39 (08/29/2009)   LDL: 152 (08/29/2009)  TG: 46 (08/29/2009)  Problem # 7:  Preventive Health Care (ICD-V70.0) teatnus and flu shot today.  Complete Medication List: 1)  Colace 100 Mg Caps (Docusate sodium) .... Take one capsule by mouth once daily 2)  Aspir-low 81 Mg Tbec (Aspirin) .... Take 1 tablet by mouth once a day 3)  Klor-con M20 20 Meq Tbcr (Potassium chloride crys cr) .... Take 1 tablet by mouth once a day 4)  Niacin Cr 250 Mg Cpcr (Niacin) .... Take 1 tablet by mouth once a day 5)  Ensure Vanilla  .... Take 1 can by mouth three times a day with meals 6)  Warfarin Sodium 3 Mg Tabs (Warfarin sodium) .... Take as directed 7)  Lipitor 40 Mg Tabs (Atorvastatin calcium) .... Once daily at night.  Other Orders: Administration Flu vaccine - MCR (G0008) TD Toxoids IM 7 YR + (04540) Admin 1st Vaccine (98119) Admin of Any Addtl Vaccine (14782)  Patient Instructions: 1)  Please schedule a follow-up appointment in 6 months. 2)  Please schedule a follow-up appointment as needed. 3)  Tobacco is very bad for your health and your loved ones! You Should stop smoking!. 4)  Stop Smoking Tips: Choose a Quit date. Cut down before the Quit date. decide what you will do as a substitute when you feel the urge to  smoke(gum,toothpick,exercise). 5)  It is important that you exercise regularly at least 20 minutes 5 times a week. If you develop chest pain, have severe difficulty breathing, or feel very tired , stop exercising immediately and seek medical attention. 6)  Take an Aspirin every day. 7)  Check your Blood Pressure regularly. If it is above: 140/90 you should make an appointment. Prescriptions: LIPITOR 40 MG TABS (ATORVASTATIN CALCIUM) once daily at night.  #30 x 11   Entered and Authorized by:   Lars Mage MD   Signed by:   Lars Mage MD on 10/12/2009   Method used:   [facility]  [address] [phone][facility] (retail)       [address]       [place], [place]  [address]       Ph: [phone] or [phone]       Fax: [phone]   RxID:   [id number] IBUPROFEN 800 MG TABS (IBUPROFEN) take 1 tab as needed for abdominalpain.  #30 x 0   Entered and Authorized by:   Lars Mage MD   Signed by:   Lars Mage MD on 10/12/2009   Method used:   [facility]        [facility]  [address] [phone]* (retail)       [address]       [place], [place]  [address]       Ph: [phone] or [phone]       Fax: [phone]   RxID:   [id number]   Prevention & Chronic Care Immunizations   Influenza vaccine: Fluvax 3+  (10/12/2009)   Influenza vaccine deferral: Refused  (08/24/2009)    Tetanus booster: 10/12/2009: Td   Td booster deferral: Deferred  (08/24/2009)    Pneumococcal vaccine: Not documented    H. zoster vaccine: Not documented   H. zoster vaccine deferral: Deferred  (08/24/2009)  Colorectal Screening   Hemoccult: Not documented   Hemoccult action/deferral: Not indicated  (08/24/2009)    Colonoscopy: Normal  (12/10/2004)  Colonoscopy due: 09/01/2014  Other Screening   PSA: 0.73  (04/22/2008)   PSA action/deferral: Not indicated  (08/24/2009)   Smoking status: current  (10/12/2009)   Smoking  cessation counseling: yes  (10/12/2009)  Lipids   Total Cholesterol: 200  (08/29/2009)   Lipid panel action/deferral: Lipid Panel ordered   LDL: 152  (08/29/2009)   LDL Direct: Not documented   HDL: 39  (08/29/2009)   Triglycerides: 46  (08/29/2009)    SGOT (AST): 14  (08/29/2009)   BMP action: Ordered   SGPT (ALT): 10  (08/29/2009)   Alkaline phosphatase: 71  (08/29/2009)   Total bilirubin: 0.4  (08/29/2009)    Lipid flowsheet reviewed?: Yes   Progress toward LDL goal: Unchanged  Hypertension   Last Blood Pressure: 118 / 81  (10/12/2009)   Serum creatinine: 0.74  (08/29/2009)   BMP action: Ordered   Serum potassium 4.1  (08/29/2009)    Hypertension flowsheet reviewed?: Yes   Progress toward BP goal: At goal  Self-Management Support :   Personal Goals (by the next clinic visit) :      Personal blood pressure goal: 140/90  (10/12/2009)     Personal LDL goal: 100  (10/12/2009)    Patient will work on the following items until the next clinic visit to reach self-care goals:     Medications and monitoring: take my medicines every day  (10/12/2009)     Eating: eat more vegetables, eat foods that are low in salt, eat baked foods instead of fried foods  (10/12/2009)    Hypertension self-management support: Pre-printed educational material, Resources for patients handout, Education handout, Written self-care plan  (10/12/2009)   Hypertension self-care plan printed.   Hypertension education handout printed    Lipid self-management support: Pre-printed educational material, Resources for patients handout, Education handout, Written self-care plan  (10/12/2009)   Lipid self-care plan printed.   Lipid education handout printed      Resource handout printed.   Nursing Instructions: Give Flu vaccine today Give tetanus booster today     Laboratory Results   Blood Tests   Date/Time Received: October 12, 2009 2:49 PM  Date/Time Reported: Burke Keels  October 12, 2009  2:49 PM    INR: 1.8   (Normal Range: 0.88-1.12   Therap INR: 2.0-3.5) Comments: Dr Alexandria Lodge paged with results and he stated he would give pt a call tomorrow with instructions.Krystal Eaton [facility])  October 12, 2009 3:14 PM      Laboratory Results   Blood Tests      INR: 1.8   (Normal Range: 0.88-1.12   Therap INR: 2.0-3.5) Comments: Dr Alexandria Lodge paged with results and he stated he would give pt a call tomorrow with instructions.Krystal Eaton [facility])  October 12, 2009 3:14 PM    Flu Vaccine Consent Questions     Do you have a history of severe allergic reactions to this vaccine? no    Any prior history of allergic reactions to egg and/or gelatin? no    Do you have a sensitivity to the preservative Thimersol? no    Do you have a past history of Guillan-Barre Syndrome? no    Do you currently have an acute febrile illness? no    Have you ever had a severe reaction to latex? no    Vaccine information given and explained to patient? yes    Are you currently pregnant? no    Lot Number: [id number]   Exp Date: 11/2009  Manufacturer: Nov[employer]is    Site Given  Left Deltoid IM.Krystal Eaton [facility])  October 12, 2009 3:51 PM    INR: 1.8   (Normal Range: 0.88-1.12   Therap INR: 2.0-3.5) Comments: Dr Alexandria Lodge paged with results and he stated he would give pt a call tomorrow with instructions.Krystal Eaton [facility])  October 12, 2009 3:14 PM     .medflu   Immunizations Administered:  Tetanus Vaccine:    Vaccine Type: Td    Site: right deltoid    Mfr: Sanofi Pasteur    Dose: 0.5 ml    Route: IM    Given by: Krystal Eaton ([facility])    Exp. Date: 06/27/2011    Lot #: [id number]    VIS given: 06/30/07 version given October 12, 2009.

## 2010-09-11 NOTE — Progress Notes (Signed)
Summary: omeprazole rx//kg  Phone Note Outgoing Call   Caller: Spouse Call For: Lars Mage MD Call placed by: Cynda Familia Duncan Dull),  November 06, 2009 4:42 PM Summary of Call: Call placed to pt to make sure he was aware of omperazole rx waiting for him at the pharmacy.  Pt's wife states pt is having to take two 20mg  pills to relieve stomach pain.  Wants to know if rx could be changed to omeperazole 40mg  daily, which pt was taking before in the past.  Wife wasnt sure why rx was written for 20mg .  Will forward to attending as Dr Eben Burow is out this week and pt has taken his last pill today.Cynda Familia [facility])  November 06, 2009 4:45 PM   Follow-up for Phone Call        I changed omeprazole to 40 mg daily and sent e-prescription to pharmacy.  Patient sould be seen in clinic to follow-up his abdominal pain. Follow-up by: Margarito Liner MD,  November 07, 2009 9:29 AM  Additional Follow-up for Phone Call Additional follow up Details #1::        pt contacted and appt made for tomorrow.Cynda Familia ([facility])  November 07, 2009 10:20 AM     New/Updated Medications: OMEPRAZOLE 40 MG CPDR (OMEPRAZOLE) Take 1 tablet daily by mouth at bedtime  Prescriptions: OMEPRAZOLE 40 MG CPDR (OMEPRAZOLE) Take 1 tablet daily by mouth at bedtime  #30 x 3   Entered and Authorized by:   Margarito Liner MD   Signed by:   Margarito Liner MD on 11/07/2009   Method used:   [facility]        [facility]  [address] [phone]* (retail)       [address]       [place], [place]  [id number]       Ph: [phone] or [phone]       Fax: [phone]   RxID:   [id number]

## 2010-09-11 NOTE — Assessment & Plan Note (Signed)
Summary: COU/CH  Anticoagulant Therapy Managed by: Barbera Setters. [name]  PharmD CACP PCP: Lars Mage MD [facility] AttendingCoralee Pesa MD, Joen Laura  Indication 1: TIA/CVA Indication 2: Aftercare long term use Anticoagulants V58.61,V58.83 Start date: 12/24/2002 Duration: Indefinite  Patient Assessment Reviewed by: Chancy Milroy PharmD  Dec 11, 2009 Medication review: verified warfarin dosage & schedule,verified previous prescription medications, verified doses & any changes, verified new medications, reviewed OTC medications, reviewed OTC health products-vitamins supplements etc Complications: none Dietary changes: none   Health status changes: none   Lifestyle changes: none   Recent/future hospitalizations: none   Recent/future procedures: none   Recent/future dental: none Patient Assessment Part 2:  Have you MISSED ANY DOSES or CHANGED TABLETS?  No missed Warfarin doses or changed tablets.  Have you had any BRUISING or BLEEDING ( nose or gum bleeds,blood in urine or stool)?  No reported bruising or bleeding in nose, gums, urine, stool.  Have you STARTED or STOPPED any MEDICATIONS, including OTC meds,herbals or supplements?  No other medications or herbal supplements were started or stopped.  Have you CHANGED your DIET, especially green vegetables,or ALCOHOL intake?  No changes in diet or alcohol intake.  Have you had any ILLNESSES or HOSPITALIZATIONS?  No reported illnesses or hospitalizations  Have you had any signs of CLOTTING?(chest discomfort,dizziness,shortness of breath,arms tingling,slurred speech,swelling or redness in leg)    No chest discomfort, dizziness, shortness of breath, tingling in arm, slurred speech, swelling, or redness in leg.     Treatment  Target INR: 2.0-3.0 INR: 3.1  Date: 12/11/2009 Regimen In:  24.0mg /week INR reflects regimen in: 3.1  New  Tablet strength: : 3mg  Regimen Out:     Sunday: 1 Tablet     Monday: 1 & 1/2 Tablet     Tuesday: 1 Tablet  Wednesday: 1 Tablet     Thursday: 1 & 1/2 Tablet      Friday: 1 Tablet     Saturday: 1 Tablet Total Weekly: 24.0mg/week mg  Next INR Due: 01/15/2010 Adjusted by: Maisee Vollman B. Sonji Starkes III PharmD CACP   Return to anticoagulation clinic:  01/15/2010 Time of next visit: 0900    Allergies: No Known Drug Allergies Prescriptions: WARFARIN SODIUM 3 MG TABS (WARFARIN SODIUM) Take as directed  #50 x 2   Entered by:   Jay Daylee Delahoz PharmD   Authorized by:   Wynne E Woodyear MD   Signed by:   Jay Erico Stan PharmD on 12/11/2009   Method used:   [facility]        [facility]  [address] [id number] (retail)       [address]       [place], [place]  [id number]       Ph: [phone] or [phone]       Fax: [phone]   RxID:   [id number]

## 2010-09-11 NOTE — Assessment & Plan Note (Signed)
Summary: ACUTE/GARG/1 MONTH RECHECK PER [facility]   Vital Signs:  Patient profile:   65 year old male Height:      73 inches (185.42 cm) Weight:      168.5 pounds (76.59 kg) BMI:     22.31 Pulse rate:   78 / minute BP sitting:   101 / 71  (right arm) Cuff size:   regular  Vitals Entered By: Cynda Familia Duncan Dull) (November 28, 2009 10:25 AM)  Serial Vital Signs/Assessments:  Time      Position  BP       Pulse  Resp  Temp     By           R Arm     118/76                         Kaye Goldston,CMA (AAMA)           L Arm     101/71                         Kaye Goldston,CMA (AAMA) pulse 79 on RA and 78 on LA CC: bp check and med refill on omperazole and lipitor Is Patient Diabetic? No Pain Assessment Patient in pain? no      Nutritional Status BMI of 19 -24 = normal  Have you ever been in a relationship where you felt threatened, hurt or afraid?No   Does patient need assistance? Functional Status Self care Ambulation Impaired:Risk for fall Comments cane    Primary Care Provider:  Lars Mage MD  CC:  bp check and med refill on omperazole and lipitor.  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 today patients BP is 101/71.   Patient currently denies SOB, Denies CP, Denies fever, chills, nausea, vomiting, diarrhea, constipation and otherwise doing well and denies any other complaints.       Preventive Screening-Counseling & Management  Alcohol-Tobacco     Smoking Status: current     Smoking Cessation Counseling: yes     Packs/Day: 0.5     Year Started: 1960     Passive Smoke Exposure: no  Current Medications (verified): 1)  Colace 100 Mg Caps (Docusate Sodium) .... Take One Capsule By Mouth Once Daily 2)  Aspir-Low 81 Mg Tbec (Aspirin) .... Take 1 Tablet By Mouth Once A Day 3)  Klor-Con M20 20 Meq Tbcr (Potassium Chloride Crys Cr) .... Take 1 Tablet By Mouth Once A Day 4)  Niacin Cr 250 Mg  Cpcr (Niacin) .... Take 1 Tablet By Mouth Once A Day 5)  Ensure Vanilla .... Take 1 Can By Mouth Three Times A Day With Meals 6)  Warfarin Sodium 3 Mg Tabs (Warfarin Sodium) .... Take As Directed 7)  Lipitor 40 Mg Tabs (Atorvastatin Calcium) .... Once Daily At Night. 8)  Omeprazole 40 Mg Cpdr (Omeprazole) .... Take 1 Tablet Daily By Mouth At Bedtime  Allergies (verified): No Known Drug Allergies  Review of Systems  Patient currently denies SOB, Denies CP, Denies fever, chills, nausea, vomiting, diarrhea, constipation and otherwise doing well and denies any other complaints.     Physical Exam  General:  alert, well-developed, and cooperative to examination.    Neck:  supple, full ROM, no thyromegaly, no JVD, and no carotid bruits.    Lungs:  normal respiratory effort and normal breath sounds.  normal respiratory effort and normal breath sounds.   Heart:  normal rate and regular rhythm.  normal rate and regular rhythm.   Abdomen:  soft and non-tender.  soft and non-tender.   Msk:  no joint swelling, no joint warmth, and no redness over joints.    Pulses:  normal peripheral pulses  Extremities:  no cyanosis, clubbing or edema  Neurologic:  no new focal deficit, weak on left Skin:   turgor normal and no rashes.  Psych:  Oriented X3, memory intact for recent and remote, normally interactive, good eye contact, not anxious appearing, and not depressed appearing.     Impression & Recommendations:  Problem # 1:  HYPERTENSION (ICD-401.9) Assessment Comment Only Patient on this visit was normotensive, we will not start any new medication, contine to monitor.  BP today: 101/71 Prior BP: 153/86 (11/08/2009)  Prior 10 Yr Risk Heart Disease: 11 % (02/27/2007)  Labs Reviewed: K+: 4.2 (10/16/2009) Creat: : 0.75 (10/16/2009)   Chol: 200 (08/29/2009)   HDL: 39 (08/29/2009)   LDL: 152 (08/29/2009)   TG: 46 (08/29/2009)  Problem # 2:  HYPERLIPIDEMIA (ICD-272.4) Assessment: Comment Only patient LDL is elevated, will need to repeat FLP if continue to be elevated may need to increase his statin.   His updated medication list for this problem includes:    Niacin Cr 250 Mg Cpcr (Niacin) .Marland Kitchen... Take 1 tablet by mouth once a day    Lipitor 40 Mg Tabs (Atorvastatin calcium) ..... Once daily at night.  Labs Reviewed: SGOT: 18 (10/16/2009)   SGPT: 13 (10/16/2009)  Prior 10 Yr Risk Heart Disease: 11 % (02/27/2007)   HDL:39 (08/29/2009), 36 (01/22/2008)  LDL:152 (08/29/2009), 74 (16/05/9603)  Chol:200 (08/29/2009), 117 (01/22/2008)  Trig:46 (08/29/2009), 37 (01/22/2008)  Problem # 3:  HYPOKALEMIA, MILD (ICD-276.8) Assessment: Comment Only On K supplements, will check Bmet if K is normal will d/c K supplements, and recheck Bmet in 1 month.   Orders: T-Basic Metabolic Panel [phone])  Complete Medication List: 1)  Colace 100 Mg Caps (Docusate sodium) .... Take one capsule by mouth once daily 2)  Aspir-low 81 Mg Tbec (Aspirin) .... Take 1 tablet by mouth once a day 3)  Klor-con M20 20 Meq Tbcr (Potassium chloride crys cr) .... Take 1 tablet by mouth once a day 4)  Niacin Cr 250 Mg Cpcr (Niacin) .... Take 1 tablet by mouth once a day 5)  Ensure Vanilla  .... Take 1 can by mouth three times a day with meals 6)  Warfarin Sodium 3 Mg Tabs (Warfarin sodium) .... Take as directed 7)  Lipitor 40 Mg Tabs (Atorvastatin calcium) .... Once daily at night. 8)   Omeprazole 40 Mg Cpdr (Omeprazole) .... Take 1 tablet daily by mouth at bedtime  Patient Instructions: 1)  Please schedule a follow-up appointment in 3 months. Prescriptions: OMEPRAZOLE 40 MG CPDR (OMEPRAZOLE) Take 1 tablet daily by mouth at bedtime  #30 x 3   Entered and Authorized by:   Darnelle Maffucci MD   Signed by:   Darnelle Maffucci MD on 11/28/2009   Method used:   [facility] to  [facility]  [address] [phone]* (retail)       [address]       [place], [place]  [id number]       Ph: [phone] or [phone]       Fax: [phone]   RxID:   [id number] LIPITOR 40 MG TABS (ATORVASTATIN CALCIUM) once daily at night.  #30 x 11   Entered and Authorized by:   Darnelle Maffucci MD   Signed by:   Darnelle Maffucci MD on 11/28/2009   Method used:   [facility]  [address] [phone]* (retail)       [address]       [place], [place]  [address]       Ph: [phone] or [phone]       Fax: [phone]   RxID:   [id number]   Prevention & Chronic Care Immunizations   Influenza vaccine: Fluvax 3+  (10/12/2009)   Influenza vaccine deferral: Refused  (08/24/2009)    Tetanus booster: 10/12/2009: Td   Td booster deferral: Deferred  (08/24/2009)    Pneumococcal vaccine: Not documented    H. zoster vaccine: Not documented   H. zoster vaccine deferral: Deferred  (11/08/2009)  Colorectal Screening   Hemoccult: Not documented   Hemoccult action/deferral: Not indicated  (08/24/2009)    Colonoscopy: Normal  (12/10/2004)   Colonoscopy due: 09/01/2014  Other Screening   PSA: 0.73  (04/22/2008)   PSA action/deferral: Not indicated  (08/24/2009)   Smoking status: current  (11/28/2009)   Smoking cessation counseling: yes  (11/28/2009)  Lipids   Total Cholesterol: 200  (08/29/2009)   Lipid panel action/deferral: Lipid Panel ordered   LDL: 152  (08/29/2009)   LDL Direct: Not documented    HDL: 39  (08/29/2009)   Triglycerides: 46  (08/29/2009)    SGOT (AST): 18  (10/16/2009)   BMP action: Ordered   SGPT (ALT): 13  (10/16/2009)   Alkaline phosphatase: 85  (10/16/2009)   Total bilirubin: 0.5  (10/16/2009)    Lipid flowsheet reviewed?: Yes   Progress toward LDL goal: Unchanged  Hypertension   Last Blood Pressure: 101 / 71  (11/28/2009)   Serum creatinine: 0.75  (10/16/2009)   BMP action: Ordered   Serum potassium 4.2  (10/16/2009)    Hypertension flowsheet reviewed?: Yes   Progress toward BP goal: Improved  Self-Management Support :   Personal Goals (by the next clinic visit) :      Personal blood pressure goal: 140/90  (10/12/2009)     Personal LDL goal: 100  (10/12/2009)    Patient will work on the following items until the next clinic visit to reach self-care goals:     Medications and monitoring: take my medicines every day  (11/28/2009)     Eating: eat foods that are low in salt, eat baked foods instead of fried foods  (11/28/2009)    Hypertension self-management support: Pre-printed educational material, Resources for patients handout, Written self-care plan  (11/28/2009)   Hypertension self-care plan printed.    Lipid self-management support: Pre-printed educational material, Resources for patients handout, Written self-care plan  (11/28/2009)   Lipid self-care plan printed.      Resource handout printed.  Process Orders Check Orders Results:     [facility]: Check successful Tests Sent for requisitioning (November 28, 2009 1:49 PM):  11/28/2009: [facility] -- T-Basic Metabolic Panel [id number] (signed)

## 2010-09-11 NOTE — Assessment & Plan Note (Signed)
Summary: COU/CH  Anticoagulant Therapy Managed by: Barbera Setters. [name]  PharmD CACP PCP: Lars Mage MD [facility] Attending: Rogelia Boga MD, Elizabeth Indication 1: TIA/CVA Indication 2: Aftercare long term use Anticoagulants V58.61,V58.83 Start date: 12/24/2002 Duration: Indefinite  Patient Assessment Reviewed by: Chancy Milroy PharmD  April 09, 2010 Medication review: verified warfarin dosage & schedule,verified previous prescription medications, verified doses & any changes, verified new medications, reviewed OTC medications, reviewed OTC health products-vitamins supplements etc Complications: none Dietary changes: none   Health status changes: none   Lifestyle changes: none   Recent/future hospitalizations: none   Recent/future procedures: none   Recent/future dental: none Patient Assessment Part 2:  Have you MISSED ANY DOSES or CHANGED TABLETS?  No missed Warfarin doses or changed tablets.  Have you had any BRUISING or BLEEDING ( nose or gum bleeds,blood in urine or stool)?  No reported bruising or bleeding in nose, gums, urine, stool.  Have you STARTED or STOPPED any MEDICATIONS, including OTC meds,herbals or supplements?  No other medications or herbal supplements were started or stopped.  Have you CHANGED your DIET, especially green vegetables,or ALCOHOL intake?  No changes in diet or alcohol intake.  Have you had any ILLNESSES or HOSPITALIZATIONS?  No reported illnesses or hospitalizations  Have you had any signs of CLOTTING?(chest discomfort,dizziness,shortness of breath,arms tingling,slurred speech,swelling or redness in leg)    No chest discomfort, dizziness, shortness of breath, tingling in arm, slurred speech, swelling, or redness in leg.     Treatment  Target INR: 2.0-3.0 INR: 3.3  Date: 04/09/2010 Regimen In:  19.5mg /week INR reflects regimen in: 3.3  New  Tablet strength: : 3mg  Regimen Out:     Sunday: 1 Tablet     Monday: 1/2 Tablet     Tuesday: 1 Tablet  Wednesday: 1 Tablet     Thursday: 1/2 Tablet      Friday: 1 Tablet     Saturday: 1 Tablet Total Weekly: 18.0mg /week mg  Next INR Due: 05/07/2010 Adjusted by: Barbera Setters. Alexandria Lodge III PharmD CACP   Return to anticoagulation clinic:  05/07/2010 Time of next visit: 0900    Allergies: No Known Drug Allergies

## 2010-09-11 NOTE — Assessment & Plan Note (Signed)
Summary: COU  Anticoagulant Therapy Managed by: Barbera Setters. [name]  PharmD CACP PCP: Lars Mage MD [facility] Attending: Sampson Goon MD, David Indication 1: TIA/CVA Indication 2: Aftercare long term use Anticoagulants V58.61,V58.83 Start date: 12/24/2002 Duration: Indefinite  Patient Assessment Reviewed by: Chancy Milroy PharmD  October 23, 2009 Medication review: verified warfarin dosage & schedule,verified previous prescription medications, verified doses & any changes, verified new medications, reviewed OTC medications, reviewed OTC health products-vitamins supplements etc Complications: none Dietary changes: none   Health status changes: none   Lifestyle changes: none   Recent/future hospitalizations: none   Recent/future procedures: none   Recent/future dental: none Patient Assessment Part 2:  Have you MISSED ANY DOSES or CHANGED TABLETS?  No missed Warfarin doses or changed tablets.  Have you had any BRUISING or BLEEDING ( nose or gum bleeds,blood in urine or stool)?  No reported bruising or bleeding in nose, gums, urine, stool.  Have you STARTED or STOPPED any MEDICATIONS, including OTC meds,herbals or supplements?  No other medications or herbal supplements were started or stopped.  Have you CHANGED your DIET, especially green vegetables,or ALCOHOL intake?  No changes in diet or alcohol intake.  Have you had any ILLNESSES or HOSPITALIZATIONS?  No reported illnesses or hospitalizations  Have you had any signs of CLOTTING?(chest discomfort,dizziness,shortness of breath,arms tingling,slurred speech,swelling or redness in leg)    No chest discomfort, dizziness, shortness of breath, tingling in arm, slurred speech, swelling, or redness in leg.     Treatment  Target INR: 2.0-3.0 INR: 3.0  Date: 10/23/2009 Regimen In:  30.0mg /week INR reflects regimen in: 3.0  New  Tablet strength: : 3mg  Regimen Out:     Sunday: 1 & 1/2 Tablet     Monday: 1 Tablet     Tuesday: 1 & 1/2  Tablet     Wednesday: 1 & 1/2 Tablet     Thursday: 1 Tablet      Friday: 1 & 1/2 Tablet     Saturday: 1 & 1/2 Tablet Total Weekly: 28.5mg /week mg  Next INR Due: 11/20/2009 Adjusted by: Barbera Setters. Alexandria Lodge III PharmD CACP   Return to anticoagulation clinic:  11/20/2009 Time of next visit: 0945    Allergies: No Known Drug Allergies

## 2010-09-11 NOTE — Assessment & Plan Note (Signed)
Summary: nausea worse/pcp-Arvin Abello/hla   Vital Signs:  Patient profile:   65 year old male Height:      73 inches (185.42 cm) Weight:      163.7 pounds (74.41 kg) BMI:     21.68 Temp:     97.1 degrees F (36.17 degrees C) oral Pulse rate:   86 / minute BP sitting:   128 / 81  (right arm)  Vitals Entered By: Krystal Eaton Duncan Dull) (October 16, 2009 10:43 AM) CC: pt c/o being awakened with nausea at night  Is Patient Diabetic? No Pain Assessment Patient in pain? no      Nutritional Status BMI of 19 -24 = normal  Have you ever been in a relationship where you felt threatened, hurt or afraid?No   Does patient need assistance? Functional Status Self care Ambulation Impaired:Risk for fall Comments cane   Primary Care Provider:  Lars Mage MD  CC:  pt c/o being awakened with nausea at night .  History of Present Illness: Patient is a 65 yo old man with PMH as described in EMR is here today for follow up of his abdominal pain. He says that the pain that he was having is gone now but he is having nausea and vomiting for last 4-5 days at night. The nausea wakes him up in the morning and he has had 2 episodes of vomiting one today morning which contained water/bile like material. He does not have fever, chills, diarrhea, contipation or blood in stools. No other complaints at this time.  Preventive Screening-Counseling & Management  Alcohol-Tobacco     Smoking Status: current     Smoking Cessation Counseling: yes     Packs/Day: 0.5     Year Started: 1960     Passive Smoke Exposure: no  Problems Prior to Update: 1)  Abdominal Pain Right Lower Quadrant  (ICD-789.03) 2)  Carotid Artery Stenosis, Right  (ICD-433.10) 3)  Coumadin Therapy  (ICD-V58.61) 4)  Abnormal Lymph Nodes On Ct  (ICD-793.4) 5)  Cva With Left Hemiparesis  (ICD-438.20) 6)  Asthenia  (ICD-780.79) 7)  Erectile Dysfunction  (ICD-302.72) 8)  Aftercare, Long-term Use, Medications Nec  (ICD-V58.69) 9)  Hypokalemia, Mild   (ICD-276.8) 10)  Health Screening  (ICD-V70.0) 11)  Pud, Hx of  (ICD-V12.71) 12)  Hx of Impetigo  (ICD-684) 13)  Tobacco Abuse  (ICD-305.1) 14)  Cerebrovascular Accident, Acute  (ICD-436) 15)  Low Back Pain  (ICD-724.2) 16)  Hypertension  (ICD-401.9) 17)  Hyperlipidemia  (ICD-272.4)  Medications Prior to Update: 1)  Colace 100 Mg Caps (Docusate Sodium) .... Take One Capsule By Mouth Once Daily 2)  Aspir-Low 81 Mg Tbec (Aspirin) .... Take 1 Tablet By Mouth Once A Day 3)  Klor-Con M20 20 Meq Tbcr (Potassium Chloride Crys Cr) .... Take 1 Tablet By Mouth Once A Day 4)  Niacin Cr 250 Mg  Cpcr (Niacin) .... Take 1 Tablet By Mouth Once A Day 5)  Ensure Vanilla .... Take 1 Can By Mouth Three Times A Day With Meals 6)  Warfarin Sodium 3 Mg Tabs (Warfarin Sodium) .... Take As Directed 7)  Lipitor 40 Mg Tabs (Atorvastatin Calcium) .... Once Daily At Night.  Current Medications (verified): 1)  Colace 100 Mg Caps (Docusate Sodium) .... Take One Capsule By Mouth Once Daily 2)  Aspir-Low 81 Mg Tbec (Aspirin) .... Take 1 Tablet By Mouth Once A Day 3)  Klor-Con M20 20 Meq Tbcr (Potassium Chloride Crys Cr) .... Take 1 Tablet By Mouth Once  A Day 4)  Niacin Cr 250 Mg  Cpcr (Niacin) .... Take 1 Tablet By Mouth Once A Day 5)  Ensure Vanilla .... Take 1 Can By Mouth Three Times A Day With Meals 6)  Warfarin Sodium 3 Mg Tabs (Warfarin Sodium) .... Take As Directed 7)  Lipitor 40 Mg Tabs (Atorvastatin Calcium) .... Once Daily At Night. 8)  Omeprazole 20 Mg Cpdr (Omeprazole) .... Once Daily At Bedtime.  Allergies (verified): No Known Drug Allergies  Past History:  Past Medical History: Last updated: 07/12/2008 Hyperlipidemia Hypertension Low back pain Stroke- 2004  Social History: Last updated: 05/23/2008 Retired Married, wife has multiple chronic medical issues([facility] patient) Current Smoker Alcohol use-no  Risk Factors: Caffeine Use: 0 (08/24/2009) Exercise: yes  (08/24/2009)  Risk Factors: Smoking Status: current (10/16/2009) Packs/Day: 0.5 (10/16/2009) Passive Smoke Exposure: no (10/16/2009)  Review of Systems      See HPI  Physical Exam  Additional Exam:  Gen: AOx3, in no acute distress, in wheel chair Eyes: PERRL, EOMI ENT:MMM, No erythema noted in posterior pharynx Neck: No JVD, No LAP Chest: CTAB with  good respiratory effort CVS: regular rhythmic rate, NO M/R/G, S1 S2 normal Abdo: soft,ND, BS+x4, Non tender and No hepatosplenomegaly EXT: No odema noted Neuro: Non focal, gait is normal Skin: no rashes noted.    Impression & Recommendations:  Problem # 1:  ABDOMINAL PAIN RIGHT LOWER QUADRANT (ICD-789.03)  Since he does not have any signs or symptoms of any infection, we will treat it as a reflux disease as this comes up 3-4 hours after he goes to bed. We wil test basic labs and have him come back in one week if it is not better. We will consider [place] abdo/pelvis for evaluation of abdominal pain/nausea and vomiting. Orders: T-Comprehensive Metabolic Panel [id number]) T-CBC w/Diff [id number]) T-Lipase [id number])  Discussed symptom control with the patient.   Complete Medication List: 1)  Colace 100 Mg Caps (Docusate sodium) .... Take one capsule by mouth once daily 2)  Aspir-low 81 Mg Tbec (Aspirin) .... Take 1 tablet by mouth once a day 3)  Klor-con M20 20 Meq Tbcr (Potassium chloride crys cr) .... Take 1 tablet by mouth once a day 4)  Niacin Cr 250 Mg Cpcr (Niacin) .... Take 1 tablet by mouth once a day 5)  Ensure Vanilla  .... Take 1 can by mouth three times a day with meals 6)  Warfarin Sodium 3 Mg Tabs (Warfarin sodium) .... Take as directed 7)  Lipitor 40 Mg Tabs (Atorvastatin calcium) .... Once daily at night. 8)  Omeprazole 20 Mg Cpdr (Omeprazole) .... Once daily at bedtime.  Patient Instructions: 1)  Please schedule a follow-up appointment as needed. 2)  Please elevate the head end of your bed to avoid  reflux. 3)  Please take your new medicine at night to avoid any reflux. Prescriptions: OMEPRAZOLE 20 MG CPDR (OMEPRAZOLE) once daily at bedtime.  #30 x 0   Entered and Authorized by:   Lars Mage MD   Signed by:   Lars Mage MD on 10/16/2009   Method used:   [facility]  [address] [phone]* (retail)       [address]       [place], [place]  [id number]       Ph: [phone] or [phone]       Fax: [phone]   RxID:   [id number]  Process Orders Check Orders Results:     [facility]: Check successful Tests Sent for requisitioning (October 16, 2009 12:09 PM):     10/16/2009: [facility] -- T-Comprehensive Metabolic Panel [[id number]] (signed)     10/16/2009: [facility] -- T-CBC w/Diff [[id number]] (signed)     10/16/2009: [facility] -- T-Lipase [phone] (signed)    Prevention & Chronic Care Immunizations   Influenza vaccine: Fluvax 3+  (10/12/2009)   Influenza vaccine deferral: Refused  (08/24/2009)    Tetanus booster: 10/12/2009: Td   Td booster deferral: Deferred  (08/24/2009)    Pneumococcal vaccine: Not documented    H. zoster vaccine: Not documented   H. zoster vaccine deferral: Deferred  (08/24/2009)  Colorectal Screening   Hemoccult: Not documented   Hemoccult action/deferral: Not indicated  (08/24/2009)    Colonoscopy: Normal  (12/10/2004)   Colonoscopy due: 09/01/2014  Other Screening   PSA: 0.73  (04/22/2008)   PSA action/deferral: Not indicated  (08/24/2009)   Smoking status: current  (10/16/2009)   Smoking cessation counseling: yes  (10/16/2009)  Lipids   Total Cholesterol: 200  (08/29/2009)   Lipid panel action/deferral: Lipid Panel ordered   LDL: 152  (08/29/2009)   LDL Direct: Not documented   HDL: 39  (08/29/2009)   Triglycerides: 46  (08/29/2009)    SGOT (AST): 14  (08/29/2009)   BMP action: Ordered   SGPT  (ALT): 10  (08/29/2009) CMP ordered    Alkaline phosphatase: 71  (08/29/2009)   Total bilirubin: 0.4  (08/29/2009)    Lipid flowsheet reviewed?: Yes   Progress toward LDL goal: Improved  Hypertension   Last Blood Pressure: 128 / 81  (10/16/2009)   Serum creatinine: 0.74  (08/29/2009)   BMP action: Ordered   Serum potassium 4.1  (08/29/2009) CMP ordered     Hypertension flowsheet reviewed?: Yes   Progress toward BP goal: At goal  Self-Management Support :   Personal Goals (by the next clinic visit) :      Personal blood pressure goal: 140/90  (10/12/2009)     Personal LDL goal: 100  (10/12/2009)    Patient will work on the following items until the next clinic visit to reach self-care goals:     Medications and monitoring: take my medicines every day  (10/16/2009)     Eating: eat foods that are low in salt, eat baked foods instead of fried foods  (10/16/2009)    Hypertension self-management support: Education handout, Resources for patients handout  (10/16/2009)   Hypertension education handout printed    Lipid self-management support: Education handout, Resources for patients handout  (10/16/2009)     Lipid education handout printed      Resource handout printed.    Process Orders Check Orders Results:     [facility]: Check successful Tests Sent for requisitioning (October 16, 2009 12:09 PM):     10/16/2009: [facility] -- T-Comprehensive Metabolic Panel [[id number]] (signed)     10/16/2009: [facility] -- T-CBC w/Diff [[id number]] (signed)     10/16/2009: [facility] -- T-Lipase [phone] (signed)

## 2010-09-11 NOTE — Assessment & Plan Note (Signed)
Summary: Coumadin Clinic  Anticoagulant Therapy Managed by: Barbera Setters. Robert Newton  PharmD CACP PCP: Lars Mage MD [facility] Attending: Darl Pikes, Beth Indication 1: TIA/CVA Indication 2: Aftercare long term use Anticoagulants V58.61,V58.83 Start date: 12/24/2002 Duration: Indefinite  Patient Assessment Reviewed by: Chancy Milroy PharmD  September 18, 2009 Medication review: verified warfarin dosage & schedule,verified previous prescription medications, verified doses & any changes, verified new medications, reviewed OTC medications, reviewed OTC health products-vitamins supplements etc Complications: none Dietary changes: none   Health status changes: none   Lifestyle changes: none   Recent/future hospitalizations: none   Recent/future procedures: none   Recent/future dental: none Patient Assessment Part 2:  Have you MISSED ANY DOSES or CHANGED TABLETS?  No missed Warfarin doses or changed tablets.  Have you had any BRUISING or BLEEDING ( nose or gum bleeds,blood in urine or stool)?  No reported bruising or bleeding in nose, gums, urine, stool.  Have you STARTED or STOPPED any MEDICATIONS, including OTC meds,herbals or supplements?  No other medications or herbal supplements were started or stopped.  Have you CHANGED your DIET, especially green vegetables,or ALCOHOL intake?  No changes in diet or alcohol intake.  Have you had any ILLNESSES or HOSPITALIZATIONS?  No reported illnesses or hospitalizations  Have you had any signs of CLOTTING?(chest discomfort,dizziness,shortness of breath,arms tingling,slurred speech,swelling or redness in leg)    No chest discomfort, dizziness, shortness of breath, tingling in arm, slurred speech, swelling, or redness in leg.     Treatment  Target INR: 2.0-3.0 INR: 2.0  Date: 09/18/2009 Regimen In:  25.5mg /week INR reflects regimen in: 2.0  New  Tablet strength: : 3mg  Regimen Out:     Sunday: 1 & 1/2 Tablet     Monday: 1 Tablet     Tuesday: 1  & 1/2 Tablet     Wednesday: 1 Tablet     Thursday: 1 & 1/2 Tablet      Friday: 1 Tablet     Saturday: 1 & 1/2 Tablet Total Weekly: 27.0mg /week mg  Next INR Due: 10/16/2009 Adjusted by: Barbera Setters. Alexandria Lodge III PharmD CACP   Return to anticoagulation clinic:  10/16/2009 Time of next visit: 0915    Allergies: No Known Drug Allergies

## 2010-09-11 NOTE — Letter (Signed)
Summary: Disability letter for tax benefit  [facility]  [address]   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  If you need additional information, please feel free to contact our office.  Sincerely,    Lars Mage MD  [facility] [address] [place] [address]

## 2010-09-11 NOTE — Letter (Signed)
Summary: HANDICAPPED   HANDICAPPED   Imported By: Margie Billet 05/24/2010 14:17:13  _____________________________________________________________________  External Attachment:    Type:   Image     Comment:   External Document

## 2010-09-11 NOTE — Assessment & Plan Note (Signed)
Summary: est-ck/fu/meds/cfb   Vital Signs:  Patient profile:   65 year old male Height:      73 inches (185.42 cm) Weight:      165.6 pounds (72.90 kg) BMI:     21.24 Temp:     97.4 degrees F (36.33 degrees C) oral Pulse rate:   78 / minute BP sitting:   137 / 75  (right arm) Cuff size:   regular  Vitals Entered By: Theotis Barrio NT II (August 24, 2009 2:11 PM) CC: NEED REFILL ON COUMADIN  /  NO OTHER CONCERNS TODAY Is Patient Diabetic? No Pain Assessment Patient in pain? no      Nutritional Status BMI of 19 -24 = normal  Have you ever been in a relationship where you felt threatened, hurt or afraid?No   Does patient need assistance? Functional Status Self care Ambulation Normal Comments NEED REFILL ON COUMADIN   /  NO OTHER CONCERNS TODAY   Primary Care Provider:  Lars Mage MD  CC:  NEED REFILL ON COUMADIN  /  NO OTHER CONCERNS TODAY.  History of Present Illness: 65 yo male with PMH as described in EMR most significant for CVA in 2004 and on Chronic coumadin therapy for last 6 years is here today for a routine office visit. He was admitted in Sep 2004 with this stroke which was most likely 2/2 plaque rupture as he has a right carotid artery with 80-99% stenosed. No other complaints at this time. Patient follows with Dr Alexandria Lodge for INR maintenance.  He still smokes 3/4 pack everyday.  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) 4)  Abdominal Pain  (ICD-789.00) 5)  Breath Sounds, Abnormal  (ICD-786.7) 6)  Leg Pain, Left   (ICD-729.5) 7)  Weight Loss  (ICD-783.21) 8)  Asthenia  (ICD-780.79) 9)  Erectile Dysfunction  (ICD-302.72) 10)  Aftercare, Long-term Use, Medications Nec  (ICD-V58.69) 11)  Hypokalemia, Mild  (ICD-276.8) 12)  Health Screening  (ICD-V70.0) 13)  Pud, Hx of  (ICD-V12.71) 14)  Hx of Impetigo  (ICD-684) 15)  Tobacco Abuse  (ICD-305.1) 16)  Cerebrovascular Accident, Acute  (ICD-436) 17)  Low Back Pain  (ICD-724.2) 18)  Hypertension  (ICD-401.9) 19)  Hyperlipidemia  (ICD-272.4)  Medications Prior to Update: 1)  Colace 100 Mg Caps (Docusate Sodium) .... Take One Capsule By Mouth Once Daily 2)  Aspir-Low 81 Mg Tbec (Aspirin) .... Take 1 Tablet By Mouth Once A Day 3)  Klor-Con M20 20 Meq Tbcr (Potassium Chloride Crys Cr) .... Take 1 Tablet By Mouth Once A Day 4)  Niacin Cr 250 Mg  Cpcr (Niacin) .... Take 1 Tablet By Mouth Once A Day 5)  Ensure Vanilla .... Take 1 Can By Mouth Three Times A Day With Meals 6)  Warfarin Sodium 3 Mg Tabs (Warfarin Sodium) .... Take As Directed 7)  Tylenol 8 Hour 650 Mg Cr-Tabs (Acetaminophen) .... Take 1 Tablet By Mouth Three Times A Day 8)  Omeprazole 40 Mg Cpdr (Omeprazole) .... Take 1 Tab By Mouth At Bedtime 9)  Tylenol/codeine #3 300-30 Mg Tabs (Acetaminophen-Codeine) .... Take 1 Tablet By Mouth Every 12 Hours As Needed For Pain  Current Medications (verified): 1)  Colace 100 Mg Caps (Docusate Sodium) .... Take One Capsule By Mouth Once Daily 2)  Aspir-Low 81 Mg Tbec (Aspirin) .... Take 1 Tablet By Mouth Once A Day 3)  Klor-Con M20 20 Meq Tbcr (Potassium Chloride Crys Cr) .... Take 1 Tablet By Mouth Once A Day 4)  Niacin Cr 250 Mg  Cpcr (Niacin) .... Take 1 Tablet By Mouth Once A Day 5)  Ensure Vanilla .... Take 1 Can By Mouth Three Times A Day With Meals 6)  Warfarin Sodium 3 Mg Tabs (Warfarin Sodium) .... Take As Directed  Allergies (verified): No Known Drug Allergies  Past History:  Past Medical History: Last updated:  07/12/2008 Hyperlipidemia Hypertension Low back pain Stroke- 2004  Social History: Last updated: 05/23/2008 Retired Married, wife has multiple chronic medical issues([facility] patient) Current Smoker Alcohol use-no  Risk Factors: Caffeine Use: 0 (08/24/2009) Exercise: yes (08/24/2009)  Risk Factors: Smoking Status: current (08/24/2009) Packs/Day: 1/3 (08/24/2009) Passive Smoke Exposure: no (08/24/2009)  Review of Systems      See HPI  Physical Exam  Additional Exam:  Gen: AOx3, in no acute distress Eyes: PERRL, EOMI ENT:MMM, No erythema noted in posterior pharynx Neck: No JVD, No LAP Chest: CTAB with  good respiratory effort CVS: regular rhythmic rate, NO M/R/G, S1 S2 normal Abdo: soft,ND, BS+x4, Non tender and No hepatosplenomegaly EXT: No odema noted Neuro: Residual weakness on the left UE and LLE. Gait is predominantly right sided. Skin: no rashes noted.    Impression & Recommendations:  Problem # 1:  CAROTID ARTERY STENOSIS, RIGHT (ICD-433.10) Patient has a severe right side stenosis noted on most recent vascular doppler study done in July 2010. He meets the criteria for CEA. I will keep the patient on coumadin at this time but I feel that he does not necessarilly needs to be on coumadin. His updated medication list for this problem includes:    Aspir-low 81 Mg Tbec (Aspirin) .Marland Kitchen... Take 1 tablet by mouth once a day    Warfarin Sodium 3 Mg Tabs (Warfarin sodium) .Marland Kitchen... 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 Lipid profile and smoking cessation counselling for risk reduction. He is on aspirin.  His updated medication list for this problem includes:    Aspir-low 81 Mg Tbec (Aspirin) .Marland Kitchen... Take 1 tablet by mouth once a day    Warfarin Sodium 3 Mg Tabs (Warfarin sodium) .Marland Kitchen... Take as directed  Orders: Echo Referral  (Echo)  Problem # 3:  TOBACCO ABUSE (ICD-305.1)  Encouraged smoking cessation and discussed different methods for smoking cessation.   Problem # 4:  HYPERTENSION (ICD-401.9) Well controlled. Orders: Echo Referral (Echo)  BP today: 137/75 Prior BP: 140/80 (07/12/2008)  Prior 10 Yr Risk Heart Disease: 11 % (02/27/2007)  Labs Reviewed: K+: 3.9 (05/23/2008) Creat: : 0.73 (05/23/2008)   Chol: 117 (01/22/2008)   HDL: 36 (01/22/2008)   LDL: 74 (01/22/2008)   TG: 37 (01/22/2008)  Problem # 5:  HYPERLIPIDEMIA (ICD-272.4) Well controlled. His updated medication list for this problem includes:    Niacin Cr 250 Mg Cpcr (Niacin) .Marland Kitchen... Take 1 tablet by mouth once a day  Future Orders:  T-Lipid Profile [id number]) ... 08/28/2009  Labs Reviewed: SGOT: 20 (05/23/2008)   SGPT: 14 (05/23/2008)  Prior 10 Yr Risk Heart Disease: 11 % (02/27/2007)   HDL:36 (01/22/2008), 33 (09/15/2006)  LDL:74 (01/22/2008), 92 (09/15/2006)  Chol:117 (01/22/2008), 133 (09/15/2006)  Trig:37 (01/22/2008), 40 (09/15/2006)  Complete Medication List: 1)  Colace 100 Mg Caps (Docusate sodium) .... Take one capsule by mouth once daily 2)  Aspir-low 81 Mg Tbec (Aspirin) .... Take 1 tablet by mouth once a day 3)  Klor-con M20 20 Meq Tbcr (Potassium chloride crys cr) .... Take 1 tablet by mouth once a day 4)  Niacin Cr 250 Mg Cpcr (Niacin) .... Take 1 tablet by mouth once a day 5)  Ensure Vanilla  .... Take 1 can by mouth three times a day with meals 6)  Warfarin Sodium 3 Mg Tabs (Warfarin sodium) .... Take as directed  Other Orders: Future Orders: T-Hepatic Function [id number]) ... 08/28/2009 T-Comprehensive Metabolic Panel [id number]) ... 08/28/2009  Patient Instructions: 1)  Please schedule a follow-up appointment in 2 weeks. 2)  Limit your Sodium (Salt). 3)  Tobacco is very bad for your health and your loved ones! You Should stop smoking!. 4)  Stop Smoking Tips: Choose a Quit date. Cut down before the Quit  date. decide what you will do as a substitute when you feel the urge to smoke(gum,toothpick,exercise). 5)  It is important that you exercise regularly at least 20 minutes 5 times a week. If you develop chest pain, have severe difficulty breathing, or feel very tired , stop exercising immediately and seek medical attention. 6)  Take an Aspirin every day. 7)  Check your Blood Pressure regularly. If it is above: 140/90 you should make an appointment. Prescriptions: WARFARIN SODIUM 3 MG TABS (WARFARIN SODIUM) Take as directed  #40 x 2   Entered and Authorized by:   Lars Mage MD   Signed by:   Lars Mage MD on 08/24/2009   Method used:   [facility]        [facility]  [phone][facility] (retail)       [address]       [place], [place]  [id number]       Ph: [phone] or [phone]       Fax: [phone]   RxID:   [id number]  Process Orders Check Orders Results:     [facility]: Check successful Tests Sent for requisitioning (August 24, 2009 10:18 PM):     08/28/2009: [facility] -- T-Lipid Profile [id number] (signed)     08/28/2009: [facility] -- T-Hepatic Function [id number] (signed)     08/28/2009: [facility] -- T-Comprehensive Metabolic Panel [id number] (signed)    Prevention & Chronic Care Immunizations   Influenza vaccine: Fluvax MCR  (05/23/2008)   Influenza vaccine deferral: Refused  (08/24/2009)    Tetanus booster: Not documented   Td booster deferral: Deferred  (08/24/2009)    Pneumococcal vaccine: Not documented    H. zoster vaccine: Not documented   H. zoster vaccine deferral: Deferred  (08/24/2009)  Colorectal Screening   Hemoccult: Not documented   Hemoccult action/deferral: Not indicated  (08/24/2009)    Colonoscopy: Normal  (12/10/2004)   Colonoscopy due: 09/01/2014  Other Screening   PSA: 0.73  (04/22/2008)   PSA action/deferral: Not  indicated  (08/24/2009)   Smoking status: current  (08/24/2009)   Smoking cessation counseling: yes  (08/24/2009)  Lipids   Total Cholesterol:  117  (01/22/2008)   Lipid panel action/deferral: Lipid Panel ordered   LDL: 74  (01/22/2008)   LDL Direct: Not documented   HDL: 36  (01/22/2008)   Triglycerides: 37  (01/22/2008)    SGOT (AST): 20  (05/23/2008)   BMP action: Ordered   SGPT (ALT): 14  (05/23/2008) CMP ordered    Alkaline phosphatase: 78  (05/23/2008)   Total bilirubin: 0.5  (05/23/2008)    Lipid flowsheet reviewed?: Yes   Progress toward LDL goal: At goal  Hypertension   Last Blood Pressure: 137 / 75  (08/24/2009)   Serum creatinine: 0.73  (05/23/2008)   BMP action: Ordered   Serum potassium 3.9  (05/23/2008) CMP ordered     Hypertension flowsheet reviewed?: Yes   Progress toward BP goal: At goal  Self-Management Support :    Patient will work on the following items until the next clinic visit to reach self-care goals:     Medications and monitoring: take my medicines every day, bring all of my medications to every visit  (08/24/2009)     Eating: drink diet soda or water instead of juice or soda, eat more vegetables, use fresh or frozen vegetables, eat baked foods instead of fried foods, eat fruit for snacks and desserts, limit or avoid alcohol  (08/24/2009)    Hypertension self-management support: BP self-monitoring log, Education handout  (08/24/2009)   Hypertension education handout printed    Lipid self-management support: Education handout  (08/24/2009)     Lipid education handout printed

## 2010-09-11 NOTE — Assessment & Plan Note (Signed)
Summary: 9AM COU APPT/CH  Anticoagulant Therapy Managed by: Barbera Setters. Robert Newton  PharmD CACP PCP: Lars Mage MD [facility] Attending: Josem Kaufmann MD, Lawrence Indication 1: TIA/CVA Indication 2: Aftercare long term use Anticoagulants V58.61,V58.83 Start date: 12/24/2002 Duration: Indefinite  Patient Assessment Reviewed by: Chancy Milroy PharmD  March 19, 2010 Medication review: verified warfarin dosage & schedule,verified previous prescription medications, verified doses & any changes, verified new medications, reviewed OTC medications, reviewed OTC health products-vitamins supplements etc Complications: none Dietary changes: none   Health status changes: none   Lifestyle changes: none   Recent/future hospitalizations: none   Recent/future procedures: none   Recent/future dental: none Patient Assessment Part 2:  Have you MISSED ANY DOSES or CHANGED TABLETS?  No missed Warfarin doses or changed tablets.  Have you had any BRUISING or BLEEDING ( nose or gum bleeds,blood in urine or stool)?  No reported bruising or bleeding in nose, gums, urine, stool.  Have you STARTED or STOPPED any MEDICATIONS, including OTC meds,herbals or supplements?  No other medications or herbal supplements were started or stopped.  Have you CHANGED your DIET, especially green vegetables,or ALCOHOL intake?  No changes in diet or alcohol intake.  Have you had any ILLNESSES or HOSPITALIZATIONS?  No reported illnesses or hospitalizations  Have you had any signs of CLOTTING?(chest discomfort,dizziness,shortness of breath,arms tingling,slurred speech,swelling or redness in leg)    No chest discomfort, dizziness, shortness of breath, tingling in arm, slurred speech, swelling, or redness in leg.     Treatment  Target INR: 2.0-3.0 INR: 3.3  Date: 03/19/2010 Regimen In:  21.0mg /week INR reflects regimen in: 3.3  New  Tablet strength: : 3mg  Regimen Out:     Sunday: 1 Tablet     Monday: 1 Tablet     Tuesday: 1  Tablet     Wednesday: 1/2 Tablet     Thursday: 1 Tablet      Friday: 1 Tablet     Saturday: 1 Tablet Total Weekly: 19.5mg/week mg  Next INR Due: 04/09/2010 Adjusted by: James B. Groce III PharmD CACP   Return to anticoagulation clinic:  04/09/2010 Time of next visit: 0900    Allergies: No Known Drug Allergies Prescriptions: WARFARIN SODIUM 3 MG TABS (WARFARIN SODIUM) Take as directed  #50 x 2   Entered by:   Jay Groce PharmD   Authorized by:   Lawrence Klima MD   Signed by:   Jay Groce PharmD on 03/19/2010   Method used:   [facility]  [address] [id number]       [address]       [place], [place]  [id number]       Ph: [phone] or [phone]       Fax: [phone]   RxID:   [id number]   Appended Document: 9AM COU APPT/CH Will ask Dr. Eben Burow to reassess appropriateness of continued coumadin therapy.  It may be appropriate although anti-platelet agents may also have an important role given his carotid artery disease.  This also may be an appropriate question for a neurologist.  Appended Document: 9AM COU APPT/CH Patient's office visit note from neurosurgery was reviewed in his March visit and he was told that by his neurosutgeon that he is not a candidate for CEA despite meeting criteria and they will preferrably continue coumadin. I will try and get the last office visit note from neurosurgeons's office for our  records.

## 2010-09-12 ENCOUNTER — Ambulatory Visit (INDEPENDENT_AMBULATORY_CARE_PROVIDER_SITE_OTHER): Payer: PRIVATE HEALTH INSURANCE | Admitting: Vascular Surgery

## 2010-09-12 DIAGNOSIS — R42 Dizziness and giddiness: Secondary | ICD-10-CM

## 2010-09-12 DIAGNOSIS — I6529 Occlusion and stenosis of unspecified carotid artery: Secondary | ICD-10-CM

## 2010-09-13 NOTE — Assessment & Plan Note (Addendum)
Summary: COU/SB.  Anticoagulant Therapy Managed by: Barbera Setters. [name]  PharmD CACP PCP: Lars Mage MD [facility] Attending: Darl Pikes, Beth Indication 1: TIA/CVA Indication 2: Aftercare long term use Anticoagulants V58.61,V58.83 Start date: 12/24/2002 Duration: Indefinite  Patient Assessment Reviewed by: Chancy Milroy PharmD  September 03, 2010 Medication review: verified warfarin dosage & schedule,verified previous prescription medications, verified doses & any changes, verified new medications, reviewed OTC medications, reviewed OTC health products-vitamins supplements etc Complications: none Dietary changes: none   Health status changes: none   Lifestyle changes: none   Recent/future hospitalizations: none   Recent/future procedures: none   Recent/future dental: none Patient Assessment Part 2:  Have you MISSED ANY DOSES or CHANGED TABLETS?  No missed Warfarin doses or changed tablets.  Have you had any BRUISING or BLEEDING ( nose or gum bleeds,blood in urine or stool)?  No reported bruising or bleeding in nose, gums, urine, stool.  Have you STARTED or STOPPED any MEDICATIONS, including OTC meds,herbals or supplements?  No other medications or herbal supplements were started or stopped.  Have you CHANGED your DIET, especially green vegetables,or ALCOHOL intake?  No changes in diet or alcohol intake.  Have you had any ILLNESSES or HOSPITALIZATIONS?  No reported illnesses or hospitalizations  Have you had any signs of CLOTTING?(chest discomfort,dizziness,shortness of breath,arms tingling,slurred speech,swelling or redness in leg)    No chest discomfort, dizziness, shortness of breath, tingling in arm, slurred speech, swelling, or redness in leg.     Treatment  Target INR: 2.0-3.0 INR: 2.4  Date: 09/03/2010 Regimen In:  19.5mg /week INR reflects regimen in: 2.4  New  Tablet strength: : 3mg  Regimen Out:     Sunday: 1 Tablet     Monday: 1 Tablet     Tuesday: 1 Tablet  Wednesday: 1 Tablet     Thursday: 1 Tablet      Friday: 1 Tablet     Saturday: 1 Tablet Total Weekly: 21.0mg /week mg  Next INR Due: 10/01/2010 Adjusted by: Barbera Setters. Alexandria Lodge III PharmD CACP   Return to anticoagulation clinic:  10/01/2010 Time of next visit: 0900    Allergies: No Known Drug Allergies

## 2010-09-13 NOTE — Assessment & Plan Note (Signed)
Summary: 9AM COU APPT/CH  Anticoagulant Therapy Managed by: Barbera Setters. Robert Newton  PharmD CACP PCP: Lars Mage MD [facility] Attending: Darl Pikes, Beth Indication 1: TIA/CVA Indication 2: Aftercare long term use Anticoagulants V58.61,V58.83 Start date: 12/24/2002 Duration: Indefinite  Patient Assessment Reviewed by: Chancy Milroy PharmD  July 30, 2010 Medication review: verified warfarin dosage & schedule,verified previous prescription medications, verified doses & any changes, verified new medications, reviewed OTC medications, reviewed OTC health products-vitamins supplements etc Complications: none Dietary changes: none   Health status changes: none   Lifestyle changes: none   Recent/future hospitalizations: none   Recent/future procedures: none   Recent/future dental: none Patient Assessment Part 2:  Have you MISSED ANY DOSES or CHANGED TABLETS?  No missed Warfarin doses or changed tablets.  Have you had any BRUISING or BLEEDING ( nose or gum bleeds,blood in urine or stool)?  No reported bruising or bleeding in nose, gums, urine, stool.  Have you STARTED or STOPPED any MEDICATIONS, including OTC meds,herbals or supplements?  No other medications or herbal supplements were started or stopped.  Have you CHANGED your DIET, especially green vegetables,or ALCOHOL intake?  No changes in diet or alcohol intake.  Have you had any ILLNESSES or HOSPITALIZATIONS?  No reported illnesses or hospitalizations  Have you had any signs of CLOTTING?(chest discomfort,dizziness,shortness of breath,arms tingling,slurred speech,swelling or redness in leg)    No chest discomfort, dizziness, shortness of breath, tingling in arm, slurred speech, swelling, or redness in leg.     Treatment  Target INR: 2.0-3.0 INR: 2.1  Date: 07/30/2010 Regimen In:  18.0mg /week INR reflects regimen in: 2.1  New  Tablet strength: : 3mg  Regimen Out:     Sunday: 1 Tablet     Monday: 1 Tablet     Tuesday: 1  Tablet     Wednesday: 1/2 Tablet     Thursday: 1 Tablet      Friday: 1 Tablet     Saturday: 1 Tablet Total Weekly: 19.5mg /week mg  Next INR Due: 08/27/2010 Adjusted by: Barbera Setters. Alexandria Lodge III PharmD CACP   Return to anticoagulation clinic:  08/27/2010 Time of next visit: 0900    Allergies: No Known Drug Allergies

## 2010-09-13 NOTE — Assessment & Plan Note (Signed)
Summary: WEAK/SB.   Vital Signs:  Patient profile:   65 year old male Height:      73 inches (185.42 cm) Weight:      170.2 pounds (77.36 kg) BMI:     22.54 Temp:     97.0 degrees F (36.11 degrees C) oral Pulse rate:   87 / minute BP sitting:   150 / 86  (right arm)  Vitals Entered By: Stanton Kidney Ditzler RN (August 16, 2010 9:24 AM) Is Patient Diabetic? No Pain Assessment Patient in pain? yes     Location: left hand and left leg Intensity: ? Type: aching Onset of pain  off and on long time Nutritional Status BMI of 19 -24 = normal Nutritional Status Detail appetite good  Have you ever been in a relationship where you felt threatened, hurt or afraid?denies   Does patient need assistance? Functional Status Self care Ambulation Wheelchair Comments Disciss pain left hand and left leg.   Primary Care Provider:  Lars Mage MD   History of Present Illness: 65yo M with hx of CVA with residual L-sided weakness, HTN, on chronic anticoagulation with warfarin (followed by Dr. Alexandria Lodge) who presents for routine f/u. Complains of needing money but says that he is able to get food and his medicine. Did not bring his medication today but says that he is taking everything. He previously used a scooter to get around but his scooter broke almost a year ago. He has been ambulating with a cane with great difficulty since his scooter broke. He complains of unsteadiness as well as occassional joint pain in his L arm and leg. He denies any other physical complaints.   Depression History:      The patient denies a depressed mood most of the day and a diminished interest in his usual daily activities.         Preventive Screening-Counseling & Management  Alcohol-Tobacco     Smoking Status: current     Smoking Cessation Counseling: yes     Packs/Day: 1/3 ppd     Year Started: 1962     Passive Smoke Exposure: no  Caffeine-Diet-Exercise     Caffeine use/day: 0     Does Patient Exercise: yes  Type of exercise: walking     Times/week: 5  Current Medications (verified): 1)  Colace 100 Mg Caps (Docusate Sodium) .... Take One Capsule By Mouth Once Daily 2)  Aspir-Low 81 Mg Tbec (Aspirin) .... Take 1 Tablet By Mouth Once A Day 3)  Niacin Cr 250 Mg  Cpcr (Niacin) .... Take 1 Tablet By Mouth Once A Day 4)  Ensure Vanilla .... Take 1 Can By Mouth Three Times A Day With Meals 5)  Warfarin Sodium 3 Mg Tabs (Warfarin Sodium) .... Take As Directed 6)  Lipitor 40 Mg Tabs (Atorvastatin Calcium) .... Once Daily At Night. 7)  Omeprazole 40 Mg Cpdr (Omeprazole) .... Take 1 Tablet Daily By Mouth At Bedtime 8)  Amlodipine Besylate 2.5 Mg Tabs (Amlodipine Besylate) .... 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 Eyes:  Denies blurring. ENT:  Denies sore throat. CV:  Denies chest pain or discomfort. Resp:  Denies shortness of breath. GI:  Denies abdominal pain and change in bowel habits. MS:  Complains of joint pain and muscle weakness. Derm:  Denies rash. Neuro:  Denies  tingling.  Physical Exam  General:  alert and cooperative to examination.   Head:  normocephalic and atraumatic.   Eyes:  vision grossly intact, pupils equal, pupils round, and pupils reactive to light.   Mouth:  pharynx pink and moist.   Neck:  supple.   Lungs:  normal breath sounds, no crackles, and no wheezes.   Heart:  normal rate, regular rhythm, no murmur, no gallop, and no rub.   Abdomen:  soft, non-tender, normal bowel sounds, and no distention.   Msk:  no joint tenderness, no joint swelling, and no joint warmth.   Extremities:  No cyanosis or edema.  Neurologic:  alert & oriented X3 and cranial nerves grossly intact. Strength 4/5 in left upper/lower extremities. 5/5 on right.  Skin:  turgor normal and no rashes.   Psych:  Oriented X3, normally interactive, and good eye contact.     Impression & Recommendations:  Problem # 1:  INADEQUATE MATERIAL RESOURCES (ICD-V60.2) Patient expressed need for money but says that he can pay for food and medicine (however, he seemed hungry and did accept food when offered). Unsure what his actual needs are. Asked Dorothe Pea to help evaluate his resources. Will request home health referral for him to get a new scooter, as he seems to be having a lot of difficulty getting around with his cane and I think that is contributing to his joint pain.   Problem # 2:  HYPERTENSION (ICD-401.9) BP elevated today despite starting amlodipine in August. Patient did not bring medications. However, I called pharmacy and confirmed that he has been filling BP scripts. If still elevated on next follow-up, may consider adding another agent.   His updated medication list for this problem includes:    Amlodipine Besylate 2.5 Mg Tabs (Amlodipine besylate) .Marland Kitchen... 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 Will work on getting him new scooter for his mobility (see above). May need to look  through old records to confirm the patient's indication for long-term warfarin therapy.   His updated medication list for this problem includes:    Aspir-low 81 Mg Tbec (Aspirin) .Marland Kitchen... Take 1 tablet by mouth once a day    Warfarin Sodium 3 Mg Tabs (Warfarin sodium) .Marland Kitchen... Take as directed  Complete Medication List: 1)  Colace 100 Mg Caps (Docusate sodium) .... Take one capsule by mouth once daily 2)  Aspir-low 81 Mg Tbec (Aspirin) .... Take 1 tablet by mouth once a day 3)  Niacin Cr 250 Mg Cpcr (Niacin) .... Take 1 tablet by mouth once a day 4)  Ensure Vanilla  .... Take 1 can by mouth three times a day with meals 5)  Warfarin Sodium 3 Mg Tabs (Warfarin sodium) .... Take as directed 6)  Lipitor 40 Mg Tabs (Atorvastatin calcium) .... Once daily at night. 7)  Omeprazole 40  Mg Cpdr (Omeprazole) .... Take 1 tablet daily by mouth at bedtime 8)  Amlodipine Besylate 2.5 Mg Tabs (Amlodipine besylate) .... Take 1 tablet by mouth once a day  Other Orders: Home Health Referral [facility])  Patient Instructions: 1)  Please schedule a follow-up appointment in 3 months. 2)  Will contact home health to request a new scooter for you.    Orders Added: 1)  Home Health Referral [facility]] 2)  Est. Patient Level IV [11914]     Prevention & Chronic Care Immunizations   Influenza vaccine: Fluvax 3+  (10/12/2009)   Influenza vaccine deferral: Refused  (08/24/2009)    Tetanus booster: 10/12/2009: Td   Td booster deferral: Deferred  (08/24/2009)    Pneumococcal vaccine: Not documented    H. zoster vaccine: Not documented   H. zoster vaccine deferral: Deferred  (11/08/2009)  Colorectal Screening   Hemoccult: Not documented   Hemoccult action/deferral: Not indicated  (08/24/2009)    Colonoscopy: Normal  (12/10/2004)   Colonoscopy due: 09/01/2014  Other Screening   PSA: 0.73  (04/22/2008)   PSA action/deferral: Not indicated  (08/24/2009)   Smoking status: current  (08/16/2010)    Smoking cessation counseling: yes  (08/16/2010)  Lipids   Total Cholesterol: 110  (04/10/2010)   Lipid panel action/deferral: Lipid Panel ordered   LDL: 69  (04/10/2010)   LDL Direct: Not documented   HDL: 34  (04/10/2010)   Triglycerides: 37  (04/10/2010)    SGOT (AST): 17  (04/10/2010)   BMP action: Ordered   SGPT (ALT): 14  (04/10/2010)   Alkaline phosphatase: 76  (04/10/2010)   Total bilirubin: 0.5  (04/10/2010)    Lipid flowsheet reviewed?: Yes   Progress toward LDL goal: Unchanged  Hypertension   Last Blood Pressure: 150 / 86  (08/16/2010)   Serum creatinine: 0.74  (04/23/2010)   BMP action: Ordered   Serum potassium 3.5  (04/23/2010)    Hypertension flowsheet reviewed?: Yes   Progress toward BP goal: Unchanged  Self-Management Support :   Personal Goals (by the next clinic visit) :      Personal blood pressure goal: 140/90  (10/12/2009)     Personal LDL goal: 100  (10/12/2009)    Patient will work on the following items until the next clinic visit to reach self-care goals:     Medications and monitoring: take my medicines every day, check my blood pressure, bring all of my medications to every visit, weigh myself weekly  (08/16/2010)     Eating: eat more vegetables, use fresh or frozen vegetables, eat foods that are low in salt, eat fruit for snacks and desserts, limit or avoid alcohol  (08/16/2010)     Activity: take a 30 minute walk every day  (08/16/2010)    Hypertension self-management support: Written self-care plan, Education handout, Resources for patients handout  (08/16/2010)   Hypertension self-care plan printed.   Hypertension education handout printed    Lipid self-management support: Written self-care plan, Education handout, Resources for patients handout  (08/16/2010)   Lipid self-care plan printed.   Lipid education handout printed      Resource handout printed.  Appended Document: Social Work Referral added Social work referral  added.  Appended Document: WEAK/SB. I discussed the patient with Dr. Odis Luster and I agree with the assessment and plan as outlined above.    Appended Document: PT referral Will refer to Physical Therapy for evaluation of need for powered mobility device.

## 2010-09-13 NOTE — Letter (Signed)
Summary: [employer]  [employer]   Imported By: Shon Hough 08/24/2010 14:47:05  _____________________________________________________________________  External Attachment:    Type:   Image     Comment:   External Document

## 2010-09-13 NOTE — Assessment & Plan Note (Signed)
Summary: Soc. Work  30 min.  Referred by Deb Ditzler as she suspected financial hardship.  Met with Robert Newton in exam room.  Hx of stroke/left hemiparesis five years ago.  Established relationship with him.  He talked a great deal about his stroke and the effects of his stroke and the need to get a new hovaround which had prev been repaired by [employer] but then broke again. This seems to be his main concern today.    He disclosed that his income was over $900 per month and his wife gets a small check as well.  He tells me his house and car is paid off and went into great history about the various cars he has owned and paid off.   He does not get Foodstamps nor does he qualify for Medicaid, but he thinks he gets help with his Medicare premium.   Robert Newton tells me he can do some tranferring and he bathes, dresses on his own.  He has a wife who also assists him.  I offered to give him resources for food pantry but he said that he was fine and was able to pay all his bills, food, and medications.   I gave him my card for any future issues or concerns.

## 2010-09-19 ENCOUNTER — Ambulatory Visit: Payer: Self-pay | Admitting: Ophthalmology

## 2010-09-19 NOTE — Miscellaneous (Signed)
  We were called to get a followup for this patient in the clinic, will get [facility] afterhours to call the patient with a followup.  Patient has a history with R-ICA stenosis of >80%, he is followed by Dr. Edilia Bo (vascular surg) and is on warfarin, he presented with dizziness to the ED, all LABS were WNL, head CT was negative, INR was theraputic. may need to go back to Dr. Edilia Bo to reevaluate if he might be a candidate for CEA given possible new symptoms.

## 2010-09-21 NOTE — Procedures (Unsigned)
CAROTID DUPLEX EXAM  INDICATION:  Dizziness  HISTORY: Diabetes:  no Cardiac:  no Hypertension:  no Smoking:  yes Previous Surgery:  no CV History:  Left-sided weakness, dizziness x 1 week Amaurosis Fugax No, Paresthesias Yes, Hemiparesis Yes                                      RIGHT             LEFT Brachial systolic pressure:         124               125 Brachial Doppler waveforms:         normal            normal Vertebral direction of flow:        Antegrade         Antegrade DUPLEX VELOCITIES (cm/sec) CCA peak systolic                   50                64 ECA peak systolic                   83                99 ICA peak systolic                   331               86 ICA end diastolic                   145               44 PLAQUE MORPHOLOGY:                  calcific          calcific PLAQUE AMOUNT:                      severe            mild PLAQUE LOCATION:                    Bifurcation, ICA  ICA  IMPRESSION: 1. Right internal carotid artery velocity suggests 80% to 99%     stenosis. 2. Left internal carotid artery velocities suggest 1% to 39% stenosis. 3. No significant change since previous exam on 04/20/2010.  ___________________________________________ Di Kindle. Edilia Bo, M.D.  EM/MEDQ  D:  09/13/2010  T:  09/13/2010  Job:  [id number]

## 2010-09-24 ENCOUNTER — Other Ambulatory Visit: Payer: Self-pay | Admitting: Internal Medicine

## 2010-09-24 NOTE — Assessment & Plan Note (Signed)
OFFICE VISIT  Robert Newton, Robert Newton DOB:  Sep 20, 1945                                       09/12/2010 ZOXWR#:[id number]  HISTORY OF PRESENT ILLNESS:  I saw the patient in the office today for continued followup of his carotid disease.  I last saw him in February 2011.  He has a greater than 80% right internal carotid artery stenosis with significant distal intracranial disease and, therefore, he is not felt to be a candidate for carotid endarterectomy.  He has recently had some problems with dizziness and he had been evaluated by Dr. Bebe Shaggy, I believe at [facility] emergency department, and a CT scan was obtained which showed no acute abnormalities.  He had an old right basal ganglia infarct.  He asked to be seen in our office.  Of note, he states that the dizziness began gradually about a week ago.  This generally occurs when he stands up or sits up rapidly.  He has not been on any new medications.  He has had no new focal weakness or paresthesias.  He does have some mild left-sided weakness from his previous right brain stroke. He has had no amaurosis fugax and no history of expressive or receptive aphasia.  SOCIAL HISTORY:  He is married.  He has 4 children.  He smokes less than a half-pack per day.  REVIEW OF SYSTEMS:  CARDIOVASCULAR:  He has had no chest pain, chest pressure, palpitations, or arrhythmias.  He has had no history of DVT that he is aware of. PULMONARY:  He has had no productive cough, bronchitis, asthma, or wheezing.  PHYSICAL EXAMINATION:  This is a pleasant 65 year old gentleman who appears his stated age.  Blood pressure 124/79, heart rate is 79, saturation 98%, respiratory rate 12.  Lungs are clear bilaterally to auscultation.  On cardiovascular exam, I do not detect any carotid bruits.  He has a regular rate and rhythm.  He has palpable femoral pulses and warm, well-perfused feet.  Abdomen is soft and nontender with normal pitched  bowel sounds.  Neurologic exam:  He has very mild left upper extremity and left lower extremity weakness.  Skin:  There are no ulcers or rashes.  I did review his CT scan which was done at [facility] which shows no evidence of acute stroke.  I did independently interpret a carotid duplex scan today which shows that there is no significant change in his carotid disease.  He has a greater than 80% right carotid stenosis with known distal disease.  On the left side, he has no significant stenosis.  Both vertebral arteries are patent with normally directed flow and arm pressures are equal.  ASSESSMENT AND PLAN:  I have reassured him that I do not think his dizziness was related to his carotid disease and he has no evidence of vertebrobasilar insufficiency.  I think he most likely has orthostatic hypotension.  I am instructing him on how to sit up and stand up gradually.  We think we do need to continue to follow his carotids and will plan on seeing him back in 1 year with a followup carotid duplex scan which I have ordered.  He knows to call sooner if he has problems. In the meantime, he is being maintained on aspirin and Coumadin.    Di Kindle. Edilia Bo, M.D. Electronically Signed  CSD/MEDQ  D:  09/12/2010  T:  09/13/2010  Job:  [id number]

## 2010-09-26 ENCOUNTER — Encounter: Payer: Self-pay | Admitting: Internal Medicine

## 2010-10-01 ENCOUNTER — Ambulatory Visit: Payer: PRIVATE HEALTH INSURANCE

## 2010-10-01 ENCOUNTER — Ambulatory Visit (INDEPENDENT_AMBULATORY_CARE_PROVIDER_SITE_OTHER): Payer: PRIVATE HEALTH INSURANCE | Admitting: Pharmacist

## 2010-10-01 DIAGNOSIS — Z7901 Long term (current) use of anticoagulants: Secondary | ICD-10-CM

## 2010-10-01 DIAGNOSIS — I6789 Other cerebrovascular disease: Secondary | ICD-10-CM

## 2010-10-01 NOTE — Progress Notes (Signed)
Anti-Coagulation Progress Note  Robert Newton is a 66 y.o. male who is currently on an anti-coagulation regimen.    RECENT RESULTS: Recent results are below, the most recent result is correlated with a dose of 21 mg. per week: Lab Results  Component Value Date   INR 2.5 10/01/2010   INR 2.27* 09/09/2010   INR 2.4 09/03/2010    ANTI-COAG DOSE:   Latest dosing instructions   Total Sun Mon Tue Wed Thu Fri Sat   21 3 mg 3 mg 3 mg 3 mg 3 mg 3 mg 3 mg    (3 mg1) (3 mg1) (3 mg1) (3 mg1) (3 mg1) (3 mg1) (3 mg1)         ANTICOAG SUMMARY: Anticoagulation Episode Summary              Current INR goal  Next INR check 10/29/2010   INR from last check 2.5 (10/01/2010)     Weekly max dose (mg)  Target end date    Indications CEREBROVASCULAR ACCIDENT, ACUTE, Long term current use of anticoagulant   INR check location  Preferred lab    Send INR reminders to ANTICOAG [facility]   Comments        Provider Role Specialty Phone number   Blanch Media  Internal Medicine [phone]        ANTICOAG TODAY: Anticoagulation Summary as of 10/01/2010              INR goal      Selected INR 2.5 (10/01/2010) Next INR check 10/29/2010   Weekly max dose (mg)  Target end date    Indications CEREBROVASCULAR ACCIDENT, ACUTE, Long term current use of anticoagulant    Anticoagulation Episode Summary              INR check location  Preferred lab    Send INR reminders to [facility]   Comments        Provider Role Specialty Phone number   Blanch Media  Internal Medicine [phone]        PATIENT INSTRUCTIONS: Patient Instructions  Patient instructed to take medications as defined in the Anti-coagulation Track section of this encounter.  Patient instructed to omit today's dose. (He has already taken this dose). Commence taking as instructed tomorrow. Patient verbalized understanding of these instructions.        FOLLOW-UP Return in 4 weeks (on 10/29/2010) for Follow up INR.Hulen Luster, III Pharm.D., CACP

## 2010-10-01 NOTE — Patient Instructions (Signed)
Patient instructed to take medications as defined in the Anti-coagulation Track section of this encounter.  Patient instructed to omit today's dose. (He has already taken this dose). Commence taking as instructed tomorrow. Patient verbalized understanding of these instructions.

## 2010-10-02 ENCOUNTER — Encounter: Payer: Self-pay | Admitting: Internal Medicine

## 2010-10-22 ENCOUNTER — Other Ambulatory Visit: Payer: Self-pay | Admitting: Internal Medicine

## 2010-10-24 ENCOUNTER — Other Ambulatory Visit: Payer: Self-pay

## 2010-10-24 ENCOUNTER — Ambulatory Visit: Payer: Self-pay | Admitting: Vascular Surgery

## 2010-10-29 ENCOUNTER — Ambulatory Visit (INDEPENDENT_AMBULATORY_CARE_PROVIDER_SITE_OTHER): Payer: PRIVATE HEALTH INSURANCE | Admitting: Pharmacist

## 2010-10-29 DIAGNOSIS — I6789 Other cerebrovascular disease: Secondary | ICD-10-CM

## 2010-10-29 DIAGNOSIS — Z7901 Long term (current) use of anticoagulants: Secondary | ICD-10-CM

## 2010-10-29 LAB — POCT INR: INR: 2.4

## 2010-10-29 NOTE — Patient Instructions (Signed)
Patient instructed to take medications as defined in the Anti-coagulation Track section of this encounter.  Patient instructed to omit today's dose. Has already taken for today. Patient verbalized understanding of these instructions.

## 2010-10-29 NOTE — Progress Notes (Signed)
Anti-Coagulation Progress Note  Robert Newton is a 65 y.o. male who is currently on an anti-coagulation regimen.    RECENT RESULTS: Recent results are below, the most recent result is correlated with a dose of 21 mg. per week: Lab Results  Component Value Date   INR 2.4 10/29/2010   INR 2.5 10/01/2010   INR 2.27* 09/09/2010    ANTI-COAG DOSE:   Latest dosing instructions   Total Sun Mon Tue Wed Thu Fri Sat   21 3 mg 3 mg 3 mg 3 mg 3 mg 3 mg 3 mg    (3 mg1) (3 mg1) (3 mg1) (3 mg1) (3 mg1) (3 mg1) (3 mg1)         ANTICOAG SUMMARY: Anticoagulation Episode Summary              Current INR goal 2.0-3.0 Next INR check 12/03/2010   INR from last check 2.4 (10/29/2010)     Weekly max dose (mg)  Target end date Indefinite   Indications CEREBROVASCULAR ACCIDENT, ACUTE, Long term current use of anticoagulant   INR check location [facility] Preferred lab    Send INR reminders to [facility]   Comments        Provider Role Specialty Phone number   Blanch Media  Internal Medicine [phone]        ANTICOAG TODAY: Anticoagulation Summary as of 10/29/2010              INR goal 2.0-3.0     Selected INR 2.4 (10/29/2010) Next INR check 12/03/2010   Weekly max dose (mg)  Target end date Indefinite   Indications CEREBROVASCULAR ACCIDENT, ACUTE, Long term current use of anticoagulant    Anticoagulation Episode Summary              INR check location [facility] Preferred lab    Send INR reminders to [facility]   Comments        Provider Role Specialty Phone number   Blanch Media  Internal Medicine [phone]        PATIENT INSTRUCTIONS: Patient Instructions  Patient instructed to take medications as defined in the Anti-coagulation Track section of this encounter.  Patient instructed to omit today's dose. Has already taken for today. Patient verbalized understanding of these instructions.        FOLLOW-UP Return in 5 weeks (on 12/03/2010) for  Follow up INR.  Hulen Luster, III Pharm.D., CACP

## 2010-11-02 ENCOUNTER — Encounter: Payer: Self-pay | Admitting: Internal Medicine

## 2010-11-19 ENCOUNTER — Encounter: Payer: Self-pay | Admitting: Internal Medicine

## 2010-11-19 ENCOUNTER — Ambulatory Visit (INDEPENDENT_AMBULATORY_CARE_PROVIDER_SITE_OTHER): Payer: PRIVATE HEALTH INSURANCE | Admitting: Internal Medicine

## 2010-11-19 VITALS — BP 146/87 | HR 87 | Temp 97.7°F | Wt 168.3 lb

## 2010-11-19 DIAGNOSIS — F17200 Nicotine dependence, unspecified, uncomplicated: Secondary | ICD-10-CM

## 2010-11-19 DIAGNOSIS — E785 Hyperlipidemia, unspecified: Secondary | ICD-10-CM

## 2010-11-19 DIAGNOSIS — I6529 Occlusion and stenosis of unspecified carotid artery: Secondary | ICD-10-CM

## 2010-11-19 DIAGNOSIS — I10 Essential (primary) hypertension: Secondary | ICD-10-CM

## 2010-11-19 DIAGNOSIS — M545 Low back pain: Secondary | ICD-10-CM

## 2010-11-19 LAB — LIPID PANEL
Cholesterol: 122 mg/dL (ref 0–200)
LDL Cholesterol: 73 mg/dL (ref 0–99)
Triglycerides: 33 mg/dL (ref <150)
VLDL: 7 mg/dL (ref 0–40)

## 2010-11-19 MED ORDER — VARENICLINE TARTRATE 1 MG PO TABS: 1.0000 mg | ORAL_TABLET | Freq: Two times a day (BID) | ORAL | Status: AC

## 2010-11-19 MED ORDER — VARENICLINE TARTRATE 0.5 MG PO TABS: ORAL_TABLET | ORAL | Status: DC

## 2010-11-19 NOTE — Assessment & Plan Note (Signed)
Would check patient's lipid profile today and continue him on atorvastatin.

## 2010-11-19 NOTE — Assessment & Plan Note (Signed)
Patient complains of back pain on and off and takes half tablet of prescription strength ibuprofen. Patient states that he does that very infrequently maybe once or twice a week. I would not change anything at this point of time.

## 2010-11-19 NOTE — Assessment & Plan Note (Signed)
2 start the patient on Chantix and see him back after 2 weeks for a followup visit for smoking cessation. Patient is very motivated at this time. Patient was extensively counseled about the side effects of Chantix. Patient is coming back to see me on 23rd which is his Coumadin appointment as well.

## 2010-11-19 NOTE — Assessment & Plan Note (Addendum)
Initial blood pressure was elevated but when I repeated the blood pressure in room it was 138/78. I would not change any blood pressure medications at this time. Would check the bmet today for potassium and creatinine.

## 2010-11-19 NOTE — Patient Instructions (Signed)
IMPORTANT: HOW TO USE THIS INFORMATION:  This is a summary and does NOT have all possible information about this product. This information does not assure that this product is safe, effective, or appropriate for you. This information is not individual medical advice and does not substitute for the advice of your health care professional. Always ask your health care professional for complete information about this product and your specific health needs.    VARENICLINE - ORAL (var-[id number])    COMMON BRAND NAME(S): Chantix    WARNING:  Infrequently, varenicline may cause serious mental/mood changes, even after stopping the medication. Drinking alcohol while taking this medication may increase the risk for mental/mood changes. Quitting smoking itself may also cause mental/mood changes. Stop taking varenicline and tell your doctor or pharmacist immediately if you have symptoms such as depression/suicidal thoughts, agitation, aggressiveness, or other unusual thoughts or behavior.    USES:  This medication is used in combination with a stop-smoking program (e.g., education materials, support group, counseling) to help you quit smoking. Varenicline works by blocking nicotine's actions in the brain. Quitting smoking decreases your risk of heart and lung disease, as well as cancer.    HOW TO USE:  Read the Medication Guide provided by your pharmacist before you start taking varenicline and each time you get a refill. If you have any questions, consult your doctor or pharmacist. Follow your doctor's directions carefully. Before beginning treatment with this drug, set a date to quit smoking. Begin taking varenicline 1 week before the quit date as follows unless directed otherwise by your doctor. When you first start taking this medication, take one 0.5-milligram tablet once a day for 3 days, then increase to one 0.5-milligram tablet twice a day for 4 days. The dose is slowly increased to lessen the chance of side  effects (e.g., nausea, unusual dreams). During this first week, it is okay to smoke. Stop smoking on the quit date and begin taking the dose prescribed by your doctor twice a day for the rest of the 12-week treatment period. If this medication comes in a dosing package, carefully follow the directions on the dosing package. There are two types of dosing packs, a starting pack and a continuing pack, each containing different strengths of this medication. If this medication comes in a bottle, carefully follow your doctor's directions on the prescription label. If you have any questions about how to take this medication, talk to your doctor or pharmacist. Take this medication by mouth after food and with a full glass of water. Dosage is based on your medical condition and response to therapy. Do not increase your dose or take this medication more often than prescribed. Your condition will not improve any faster, and the risk of serious side effects may be increased. Do not take more than 1 milligram twice a day. Use this medication regularly to get the most benefit from it. To help you remember, take it at the same time(s) each day. Inform your doctor if you continue to smoke after a few weeks of treatment. If you are successful and cigarette-free after 12 weeks of treatment, your doctor may recommend another 12 weeks of treatment with varenicline.    SIDE EFFECTS:  See also Warning section. Nausea, headache, vomiting, drowsiness, gas, constipation, trouble sleeping, unusual dreams, or changes in taste may occur. If any of these effects persist or worsen, tell your doctor or pharmacist promptly. Remember that your doctor has prescribed this medication because he or she has judged that  the benefit to you is greater than the risk of side effects. Many people using this medication do not have serious side effects. A very serious allergic reaction to this drug is rare. However, seek immediate medical attention if you  notice any symptoms of a serious allergic reaction, including: rash, itching/swelling (especially of the face/tongue/throat), severe dizziness, trouble breathing. This is not a complete list of possible side effects. If you notice other effects not listed above, contact your doctor or pharmacist. In the [place] - Call your doctor for medical advice about side effects. You may report side effects to FDA at 1-800-FDA-1088. In [place] - Call your doctor for medical advice about side effects. You may report side effects to [facility] [place] at [phone].    PRECAUTIONS:  Before taking varenicline, tell your doctor or pharmacist if you are allergic to it; or if you have any other allergies. This product may contain inactive ingredients, which can cause allergic reactions or other problems. Talk to your pharmacist for more details. Before using this medication, tell your doctor or pharmacist your medical history, especially of: kidney disease (especially kidney dialysis), mental/mood disorders. This drug may make you drowsy. Do not drive, use machinery, or do any activity that requires alertness until you are sure you can perform such activities safely. Avoid alcoholic beverages. Kidney function declines as you grow older. This medication is removed by the kidneys. Therefore, elderly people may be at a greater risk for nausea and other side effects while using this drug. During pregnancy, this medication should be used only when clearly needed. Discuss the risks and benefits with your doctor. It is unknown if this medication passes into breast milk. Consult your doctor before breast-feeding.    DRUG INTERACTIONS:  Your doctor or pharmacist may already be aware of any possible drug interactions and may be monitoring you for them. Do not start, stop, or change the dosage of any medicine before checking with your doctor or pharmacist first. Before using this medication, tell your doctor or pharmacist of all prescription and  nonprescription/herbal products you may use, especially of: nicotine replacement therapy (e.g., patch, gum, nasal spray). Smoking can affect the way your body removes certain drugs. When you stop smoking, your doses of these drugs may need to be adjusted by your doctor. Tell your doctor if you take any of the following medications. Some of the drugs that smoking may affect, among others, are: "blood thinners" (e.g., warfarin), insulin, theophylline. This document does not contain all possible interactions. Therefore, before using this product, tell your doctor or pharmacist of all the products you use. Keep a list of all your medications with you, and share the list with your doctor and pharmacist.    OVERDOSE:  If overdose is suspected, contact your local poison control center or emergency room immediately. [place] residents should call the [place] National Poison Hotline at 7268139834. [place] residents should call a provincial poison control center.    NOTES:  Do not share this medication with others. Laboratory and/or medical tests (e.g., kidney function) should be performed periodically to monitor your progress or check for side effects. Consult your doctor for more details. Getting regular exercise and maintaining a nutritious diet, along with using educational materials, receiving counseling, and attending support groups, may help you to successfully quit smoking. Consult your doctor for details.    MISSED DOSE:  If you miss a dose, take it as soon as you remember. If it is near the time of the next dose,  skip the missed dose and resume your usual dosing schedule. Do not double the dose to catch up.    STORAGE:  Store at room temperature at 77 degrees F (25 degrees C) away from light and moisture. Brief storage between 59-86 degrees F (15-30 degrees C) is permitted. Do not store in the bathroom. Keep all medicines away from children and pets. Do not flush medications down the toilet or pour them into a drain  unless instructed to do so. Properly discard this product when it is expired or no longer needed. Consult your pharmacist or local waste disposal company for more details about how to safely discard your product.    Information last revised September 2010 Copyright(c) 2010 [employer]    Set up an appointment in 2 weeks. Stop smoking 1 week after you start chantix.

## 2010-11-19 NOTE — Progress Notes (Signed)
  Subjective:    Patient ID: Robert Newton, male    DOB: 09/21/1945, 65 y.o.   MRN: [id number]  HPI Robert Newton is 65 year old man with past medical history of stroke in 2004. Patient has been on chronic Coumadin therapy ever since as prescribed by his neurologist and his neurosurgery. This is her regular followup appointment.  Patient's blood pressure initial reading 146/87 but in the room it was 138/78. I would not change his blood pressure medications at this time.  Patient is still smoking about 3/4 pack of cigarettes a day. I discussed with him possible treatment options and he is agreeable to try Chantix.  Hyperlipidemia patient is currently taking atorvastatin I would check lipid profile today.  No other problems at this time patient is compliant with his anticoagulant medications.      Review of Systems  Constitutional: Negative for fever, activity change and appetite change.  HENT: Negative for sore throat.   Respiratory: Negative for cough and shortness of breath.   Cardiovascular: Negative for chest pain and leg swelling.  Gastrointestinal: Negative for nausea, abdominal pain, diarrhea, constipation and abdominal distention.  Genitourinary: Negative for frequency, hematuria and difficulty urinating.  Neurological: Negative for dizziness and headaches.  Psychiatric/Behavioral: Negative for suicidal ideas and behavioral problems.       Objective:   Physical Exam  Constitutional: He is oriented to person, place, and time. He appears well-developed and well-nourished.  HENT:  Head: Normocephalic and atraumatic.  Eyes: Conjunctivae and EOM are normal. Pupils are equal, round, and reactive to light. No scleral icterus.  Neck: Normal range of motion. Neck supple. No JVD present. No thyromegaly present.  Cardiovascular: Normal rate, regular rhythm, normal heart sounds and intact distal pulses.  Exam reveals no gallop and no friction rub.   No murmur heard. Pulmonary/Chest: Effort  normal and breath sounds normal. No respiratory distress. He has no wheezes. He has no rales.  Abdominal: Soft. Bowel sounds are normal. He exhibits no distension and no mass. There is no tenderness. There is no rebound and no guarding.  Musculoskeletal: He exhibits no edema and no tenderness.       Left sided weakness  Lymphadenopathy:    He has no cervical adenopathy.  Neurological: He is alert and oriented to person, place, and time.       Left sided weakness 2/2 prior stroke  Psychiatric: He has a normal mood and affect. His behavior is normal.          Assessment & Plan:

## 2010-11-20 ENCOUNTER — Other Ambulatory Visit: Payer: Self-pay | Admitting: Internal Medicine

## 2010-11-20 LAB — COMPLETE METABOLIC PANEL WITH GFR
ALT: 15 U/L (ref 0–53)
CO2: 27 mEq/L (ref 19–32)
Calcium: 9.3 mg/dL (ref 8.4–10.5)
Chloride: 101 mEq/L (ref 96–112)
Creat: 0.8 mg/dL (ref 0.40–1.50)
GFR, Est African American: >60 mL/min (ref >60)
GFR, Est Non African American: >60 mL/min (ref >60)
Glucose, Bld: 91 mg/dL (ref 70–99)
Total Bilirubin: 0.6 mg/dL (ref 0.3–1.2)

## 2010-12-03 ENCOUNTER — Ambulatory Visit (INDEPENDENT_AMBULATORY_CARE_PROVIDER_SITE_OTHER): Payer: PRIVATE HEALTH INSURANCE | Admitting: Internal Medicine

## 2010-12-03 ENCOUNTER — Ambulatory Visit (INDEPENDENT_AMBULATORY_CARE_PROVIDER_SITE_OTHER): Payer: PRIVATE HEALTH INSURANCE | Admitting: Pharmacist

## 2010-12-03 ENCOUNTER — Encounter: Payer: Self-pay | Admitting: Internal Medicine

## 2010-12-03 DIAGNOSIS — Z7901 Long term (current) use of anticoagulants: Secondary | ICD-10-CM

## 2010-12-03 DIAGNOSIS — I6789 Other cerebrovascular disease: Secondary | ICD-10-CM

## 2010-12-03 DIAGNOSIS — F17200 Nicotine dependence, unspecified, uncomplicated: Secondary | ICD-10-CM

## 2010-12-03 DIAGNOSIS — I10 Essential (primary) hypertension: Secondary | ICD-10-CM

## 2010-12-03 DIAGNOSIS — E785 Hyperlipidemia, unspecified: Secondary | ICD-10-CM

## 2010-12-03 LAB — POCT INR: INR: 2.8

## 2010-12-03 NOTE — Progress Notes (Signed)
Anti-Coagulation Progress Note  Akito Boomhower is a 65 y.o. male who is currently on an anti-coagulation regimen.    RECENT RESULTS: Recent results are below, the most recent result is correlated with a dose of 21 mg. per week: Lab Results  Component Value Date   INR 2.8 12/03/2010   INR 2.4 10/29/2010   INR 2.5 10/01/2010    ANTI-COAG DOSE:   Latest dosing instructions   Total Sun Mon Tue Wed Thu Fri Sat   21 3 mg 3 mg 3 mg 3 mg 3 mg 3 mg 3 mg    (3 mg1) (3 mg1) (3 mg1) (3 mg1) (3 mg1) (3 mg1) (3 mg1)         ANTICOAG SUMMARY: Anticoagulation Episode Summary              Current INR goal 2.0-3.0 Next INR check 12/31/2010   INR from last check 2.8 (12/03/2010)     Weekly max dose (mg)  Target end date Indefinite   Indications CEREBROVASCULAR ACCIDENT, ACUTE, Long term current use of anticoagulant   [facility] check [facility]    Send INR reminders to [facility]   Comments        Provider Role Specialty Phone number   Blanch Media  Internal Medicine [phone]        ANTICOAG TODAY: Anticoagulation Summary as of 12/03/2010              INR goal 2.0-3.0     Selected INR 2.8 (12/03/2010) Next INR check 12/31/2010   Weekly max dose (mg)  Target end date Indefinite   Indications CEREBROVASCULAR ACCIDENT, ACUTE, Long term current use of anticoagulant    Anticoagulation Episode Summary              INR check [facility]    Send INR reminders to [facility]   Comments        Provider Role Specialty Phone number   Blanch Media  Internal Medicine [phone]        PATIENT INSTRUCTIONS: Patient Instructions  Patient instructed to take medications as defined in the Anti-coagulation Track section of this encounter.  Patient instructed to take today's dose.  Patient verbalized understanding of these instructions.        FOLLOW-UP Return in 4 weeks (on 12/31/2010) for Follow up INR.Hulen Luster,  III Pharm.D., CACP

## 2010-12-03 NOTE — Assessment & Plan Note (Signed)
Patient will see his pharmacist for Coumadin therapy.

## 2010-12-03 NOTE — Assessment & Plan Note (Signed)
Patient's LDL and HDL are both had goal.

## 2010-12-03 NOTE — Patient Instructions (Signed)
Patient instructed to take medications as defined in the Anti-coagulation Track section of this encounter.  Patient instructed to take today's dose.  Patient verbalized understanding of these instructions.    

## 2010-12-03 NOTE — Assessment & Plan Note (Signed)
The patient will be referred to social work for smoking cessation counseling. I advised him to take one tablet of 1 mg Chantix twice daily. If this does not work patient was advised to try nicotine gums which are available over-the-counter.

## 2010-12-03 NOTE — Assessment & Plan Note (Signed)
I would go up on blood pressure medication Norvasc from 2.5 mg a day to 10 mg a day. I would follow him back in 6 months.

## 2010-12-03 NOTE — Progress Notes (Signed)
  Subjective:    Patient ID: Robert Newton, male    DOB: 02-15-1946, 65 y.o.   MRN: [id number]  HPI 65 year old man with past medical history as mentioned in the chart. Patient is here today for followup of his blood pressure and smoking cessation. Patient also has an appointment at Coumadin clinic today.  Patient's blood pressure is 151/79. I would go up on his blood pressure pill today.  Patient has still been smoking about half pack of cigarettes a day patient says that he has been taking one tablet of Chantix a day. I told him that he supposed to take one tablet twice daily. I would also refer him to [facility] for smoking cessation counseling.  No other complaints I would see him back in 6 months.   Review of Systems  Constitutional: Negative for fever, activity change and appetite change.  HENT: Negative for sore throat.   Respiratory: Negative for cough and shortness of breath.   Cardiovascular: Negative for chest pain and leg swelling.  Gastrointestinal: Negative for nausea, abdominal pain, diarrhea, constipation and abdominal distention.  Genitourinary: Negative for frequency, hematuria and difficulty urinating.  Neurological: Negative for dizziness and headaches.  Psychiatric/Behavioral: Negative for suicidal ideas and behavioral problems.       Objective:   Physical Exam  Constitutional: He is oriented to person, place, and time. He appears well-developed and well-nourished.  HENT:  Head: Normocephalic and atraumatic.       Facial droop  Eyes: Conjunctivae and EOM are normal. Pupils are equal, round, and reactive to light. No scleral icterus.  Neck: Normal range of motion. Neck supple. No JVD present. No thyromegaly present.  Cardiovascular: Normal rate, regular rhythm, normal heart sounds and intact distal pulses.  Exam reveals no gallop and no friction rub.   No murmur heard. Pulmonary/Chest: Effort normal and breath sounds normal. No respiratory distress. He has no wheezes.  He has no rales.  Abdominal: Soft. Bowel sounds are normal. He exhibits no distension and no mass. There is no tenderness. There is no rebound and no guarding.  Musculoskeletal: He exhibits no edema and no tenderness.       Left arm weakness Left leg weakness  Lymphadenopathy:    He has no cervical adenopathy.  Neurological: He is alert and oriented to person, place, and time.  Psychiatric: He has a normal mood and affect. His behavior is normal.          Assessment & Plan:

## 2010-12-03 NOTE — Patient Instructions (Signed)
Smoking Cessation This document explains the best ways for you to quit smoking and new treatments to help. It lists new medicines that can double or triple your chances of quitting and quitting for good. It also considers ways to avoid relapses and concerns you may have about quitting, including weight gain. NICOTINE: A POWERFUL ADDICTION If you have tried to quit smoking, you know how hard it can be. It is hard because nicotine is a very addictive drug. For some people, it can be as addictive as heroin or cocaine. Usually, people make 2 or 3 tries, or more, before finally being able to quit. Each time you try to quit, you can learn about what helps and what hurts. Quitting takes hard work and a lot of effort, but you can quit smoking. QUITTING SMOKING IS ONE OF THE MOST IMPORTANT THINGS YOU WILL EVER DO:  You will live longer, feel better, and live better.   The impact on your body of quitting smoking is felt almost immediately:   Within 20 minutes, blood pressure decreases. Pulse returns to its normal level.   After 8 hours, carbon monoxide levels in the blood return to normal. Oxygen level increases.   After 24 hours, chance of heart attack starts to decrease. Breath, hair, and body stop smelling like smoke.   After 48 hours, damaged nerve endings begin to recover. Sense of taste and smell improve.   After 72 hours, the body is virtually free of nicotine. Bronchial tubes relax and breathing becomes easier.   After 2 to 12 weeks, lungs can hold more air. Exercise becomes easier and circulation improves.   Quitting will lower your chance of having a heart attack, stroke, cancer, or lung disease:   After 1 year, the risk of coronary heart disease is cut in half.   After 5 years, the risk of stroke falls to the same as a nonsmoker.   After 10 years, the risk of lung cancer is cut in half and the risk of other cancers decreases significantly.   After 15 years, the risk of coronary heart  disease drops, usually to the level of a nonsmoker.   If you are pregnant, quitting smoking will improve your chances of having a healthy baby.   The people you live with, especially your children, will be healthier.   You will have extra money to spend on things other than cigarettes.  FIVE KEYS TO QUITTING Studies have shown that these 5 steps will help you quit smoking and quit for good. You have the best chances of quitting if you use them together: 1. Get ready.  2. Get support and encouragement.  3. Learn new skills and behaviors.  4. Get medicine to reduce your nicotine addiction and use it correctly.  5. Be prepared for relapse or difficult situations. Be determined to continue trying to quit, even if you do not succeed at first.  1. GET READY  Set a quit date.   Change your environment.   Get rid of ALL cigarettes, ashtrays, matches, and lighters in your home, car, and place of work.   Do not let people smoke in your home.   Review your past attempts to quit. Think about what worked and what did not.   Once you quit, do not smoke. NOT EVEN A PUFF!  2. GET SUPPORT AND ENCOURAGEMENT Studies have shown that you have a better chance of being successful if you have help. You can get support in many ways.  Tell  your family, friends, and coworkers that you are going to quit and need their support. Ask them not to smoke around you.   Talk to your caregivers (doctor, dentist, nurse, pharmacist, psychologist, and/or smoking counselor).   Get individual, group, or telephone counseling and support. The more counseling you have, the better your chances are of quitting. Programs are available at local hospitals and health centers. Call your local health department for information about programs in your area.   Spiritual beliefs and practices may help some smokers quit.   Quit meters are small computer programs online or downloadable that keep track of quit statistics, such as amount  of "quit-time," cigarettes not smoked, and money saved.   Many smokers find one or more of the many self-help books available useful in helping them quit and stay off tobacco.  3. LEARN NEW SKILLS AND BEHAVIORS  Try to distract yourself from urges to smoke. Talk to someone, go for a walk, or occupy your time with a task.   When you first try to quit, change your routine. Take a different route to work. Drink tea instead of coffee. Eat breakfast in a different place.   Do something to reduce your stress. Take a hot bath, exercise, or read a book.   Plan something enjoyable to do every day. Reward yourself for not smoking.   Explore interactive web-based programs that specialize in helping you quit.  4. GET MEDICINE AND USE IT CORRECTLY Medicines can help you stop smoking and decrease the urge to smoke. Combining medicine with the above behavioral methods and support can quadruple your chances of successfully quitting smoking. The [place] Food and Drug Administration (FDA) has approved 7 medicines to help you quit smoking. These medicines fall into 3 categories.  Nicotine replacement therapy (delivers nicotine to your body without the negative effects and risks of smoking):   Nicotine gum: Available over-the-counter.   Nicotine lozenges: Available over-the-counter.   Nicotine inhaler: Available by prescription.   Nicotine nasal spray: Available by prescription.   Nicotine skin patches (transdermal): Available by prescription and over-the-counter.   Antidepressant medicine (helps people abstain from smoking, but how this works is unknown):   Bupropion sustained-release (SR) tablets: Available by prescription.   Nicotinic receptor partial agonist (simulates the effect of nicotine in your brain):   Varenicline tartrate tablets: Available by prescription.   Ask your caregiver for advice about which medicines to use and how to use them. Carefully read the information on the package.    Everyone who is trying to quit may benefit from using a medicine. If you are pregnant or trying to become pregnant, nursing an infant, you are under age 18, or you smoke fewer than 10 cigarettes per day, talk to your caregiver before taking any nicotine replacement medicines.   You should stop using a nicotine replacement product and call your caregiver if you experience nausea, dizziness, weakness, vomiting, fast or irregular heartbeat, mouth problems with the lozenge or gum, or redness or swelling of the skin around the patch that does not go away.   Do not use any other product containing nicotine while using a nicotine replacement product.   Talk to your caregiver before using these products if you have diabetes, heart disease, asthma, stomach ulcers, you had a recent heart attack, you have high blood pressure that is not controlled with medicine, a history of irregular heartbeat, or you have been prescribed medicine to help you quit smoking.  5. BE PREPARED FOR RELAPSE OR   DIFFICULT SITUATIONS  Most relapses occur within the first 3 months after quitting. Do not be discouraged if you start smoking again. Remember, most people try several times before they finally quit.   You may have symptoms of withdrawal because your body is used to nicotine. You may crave cigarettes, be irritable, feel very hungry, cough often, get headaches, or have difficulty concentrating.   The withdrawal symptoms are only temporary. They are strongest when you first quit, but they will go away within 10 to 14 days.  Here are some difficult situations to watch for:  Alcohol. Avoid drinking alcohol. Drinking lowers your chances of successfully quitting.   Caffeine. Try to reduce the amount of caffeine you consume. It also lowers your chances of successfully quitting.   Other smokers. Being around smoking can make you want to smoke. Avoid smokers.   Weight gain. Many smokers will gain weight when they quit, usually  less than 10 pounds. Eat a healthy diet and stay active. Do not let weight gain distract you from your main goal, quitting smoking. Some medicines that help you quit smoking may also help delay weight gain. You can always lose the weight gained after you quit.   Bad mood or depression. There are a lot of ways to improve your mood other than smoking.  If you are having problems with any of these situations, talk to your caregiver. SPECIAL SITUATIONS OR CONDITIONS Studies suggest that everyone can quit smoking. Your situation or condition can give you a special reason to quit.  Pregnant women/New mothers: By quitting, you protect your baby's health and your own.   Hospitalized patients: By quitting, you reduce health problems and help healing.   Heart attack patients: By quitting, you reduce your risk of a second heart attack.   Lung, head, and neck cancer patients: By quitting, you reduce your chance of a second cancer.   Parents of children and adolescents: By quitting, you protect your children from illnesses caused by secondhand smoke.  QUESTIONS TO THINK ABOUT Think about the following questions before you try to stop smoking. You may want to talk about your answers with your caregiver.  Why do you want to quit?   If you tried to quit in the past, what helped and what did not?   What will be the most difficult situations for you after you quit? How will you plan to handle them?   Who can help you through the tough times? Your family? Friends? Caregiver?   What pleasures do you get from smoking? What ways can you still get pleasure if you quit?  Here are some questions to ask your caregiver:  How can you help me to be successful at quitting?   What medicine do you think would be best for me and how should I take it?   What should I do if I need more help?   What is smoking withdrawal like? How can I get information on withdrawal?  Quitting takes hard work and a lot of effort,  but you can quit smoking. FOR MORE INFORMATION Smokefree.gov (http://www.davis-sullivan.com/) provides free, accurate, evidence-based information and professional assistance to help support the immediate and long-term needs of people trying to quit smoking. Document Released: 07/23/2001 Document Re-Released: 01/16/2010 [facility] Patient Information 2011 [place], [place].   Go up on your BP pill, I have increased your norvasc to 10 mg a day at night time. Follow up in 6 months.

## 2010-12-11 ENCOUNTER — Telehealth: Payer: Self-pay | Admitting: Licensed Clinical Social Worker

## 2010-12-20 NOTE — Telephone Encounter (Signed)
Left message on 5/1 and left another message on 5/10.   Pt has appmt with Chancy Milroy on 5/21 9:30 AM and we can try and meet then.

## 2010-12-25 NOTE — Assessment & Plan Note (Signed)
OFFICE VISIT   TORRIS, HOUSE  DOB:  May 08, 1946                                       02/23/2008  ZOXWR#:[id number]   I saw the patient in the office today for continued followup of his  carotid disease.  He has a tight right carotid stenosis which is  surgically inaccessible and extends up into the internal carotid artery.  There is no distal endpoint.  He has significant intracranial disease by  MRA.  He had had a stroke in April of 2004 related to this stenosis.  I  have been following a mild left carotid stenosis and he comes in for a  yearly followup visit.  Since I saw him last he has continued to show  some gradual improvement in the strength in the left especially in the  upper extremity.  He has had no new neurologic symptoms, specifically he  has had no TIAs, expressive or receptive aphasia or amaurosis fugax.   REVIEW OF SYSTEMS:  On review of systems he has had no recent chest  pain, chest pressure, palpitations or arrhythmias.  He has had no  bronchitis, asthma or wheezing.   PHYSICAL EXAMINATION:  Vital signs:  On physical examination blood  pressure of 109/73, heart rate is 81.  Neck:  Neck is supple.  I do not  detect any carotid bruits.  Lungs:  Are clear bilaterally to  auscultation.  Cardiac:  He has a regular rate and rhythm.  Neurologic:  Exam reveals good strength in the upper extremity and lower extremity on  the right.  On the left side he has improving strength in the upper  extremity but still has significant left-sided weakness.  He is  ambulatory with a cane.   His carotid duplex scan shows a greater than 80% right carotid stenosis  with a less than 39% left carotid stenosis.  Both vertebral arteries are  patent with normally directed flow.   I reassured him there has been no significant change in the mild left  carotid stenosis.  We will see him back in 1 year for a followup duplex  scan.  He knows to call sooner if he has  problems.  In the meantime he  knows to continue taking his aspirin.   Di Kindle. Edilia Bo, M.D.  Electronically Signed   CSD/MEDQ  D:  02/23/2008  T:  02/24/2008  Job:  [id number]

## 2010-12-25 NOTE — Procedures (Signed)
CAROTID DUPLEX EXAM   INDICATION:  Followup of know carotid artery occlusive disease.   HISTORY:  Diabetes:  No.  Cardiac:  No.  Hypertension:  No.  Smoking:  Yes.  Previous Surgery:  No.  CV History:  CVA with left hemiparesis in 2004.  Amaurosis Fugax No. , Paresthesias No, Hemiparesis Yes.                                         RIGHT             LEFT  Brachial systolic pressure:         138               126  Brachial Doppler waveforms:         Triphasic         Triphasic  Vertebral direction of flow:        Antegrade         Antegrade  DUPLEX VELOCITIES (cm/sec)  CCA peak systolic                   68                81  ECA peak systolic                   83                113  ICA peak systolic                   344               87  ICA end diastolic                   150               45  PLAQUE MORPHOLOGY:                  Calcified         Calcified  PLAQUE AMOUNT:                      Severe            Mild  PLAQUE LOCATION:                    Bifurcation, ICA  ICA   IMPRESSION:  1. 80 to 99% right internal carotid artery stenosis.  2. 20 to 39% left internal carotid artery stenosis.  3. No significant change since exam of 09/21/2009.   ___________________________________________  Di Kindle. Edilia Bo, M.D.   CH/MEDQ  D:  04/23/2010  T:  04/23/2010  Job:  [id number]

## 2010-12-25 NOTE — Procedures (Signed)
CAROTID DUPLEX EXAM   INDICATION:  Followup of known carotid artery disease.  Known severe  right ICA stenosis.  Patient is currently asymptomatic.   HISTORY:  Diabetes:  No.  Cardiac:  No.  Hypertension:  No.  Smoking:  Yes, one-half pack per day.  Previous Surgery:  CV History:  Patient had CVA in 2004, leaving him with residual left-  sided weakness and hemiparesis.  Amaurosis Fugax No, Paresthesias No, Hemiparesis No.                                       RIGHT             LEFT  Brachial systolic pressure:         110               94  Brachial Doppler waveforms:         WNL               WNL  Vertebral direction of flow:        Antegrade         Antegrade  DUPLEX VELOCITIES (cm/sec)  CCA peak systolic                   61                75  ECA peak systolic                   106               80  ICA peak systolic                   329               (P) 72, (M) 98  ICA end diastolic                   146               (P) 35, (M) 52  PLAQUE MORPHOLOGY:                  Mixed, calcific   Calcific  PLAQUE AMOUNT:                      Critical          Mild  PLAQUE LOCATION:                    Bifurcation, ICA  Bifurcation, ICA   IMPRESSION:  1. Known right 80-99% internal carotid artery stenosis with low flow      noted distal to the stenosis.  2. Left 1-39% internal carotid artery stenosis.  3. Patent external carotid arteries.  4. Antegrade flow in vertebral arteries.  5. Study essentially unchanged from that on 08/25/07.   ___________________________________________  Di Kindle. Edilia Bo, M.D.   PB/MEDQ  D:  02/23/2008  T:  02/23/2008  Job:  [id number]

## 2010-12-25 NOTE — Procedures (Signed)
CAROTID DUPLEX EXAM   INDICATION:  Follow up known carotid artery disease.   HISTORY:  Diabetes:  No.  Cardiac:  No.  Hypertension:  No.  Smoking:  Yes.  Previous Surgery:  None.  CV History:  CVA in 2004.  Amaurosis Fugax , Paresthesias , Hemiparesis Yes                                       RIGHT             LEFT  Brachial systolic pressure:  Brachial Doppler waveforms:  Vertebral direction of flow:        Antegrade         Antegrade  DUPLEX VELOCITIES (cm/sec)  CCA peak systolic                   59                90  ECA peak systolic                   73                96  ICA peak systolic                   335               65  ICA end diastolic                   137               30  PLAQUE MORPHOLOGY:                  Heterogenous      Heterogenous  PLAQUE AMOUNT:                      Severe            Mild  PLAQUE LOCATION:                    ICA, ECA          ICA, ECA   IMPRESSION:  1. 80-99% stenosis noted in the right internal carotid artery.  2. Distal right internal carotid artery flow indicates occlusion.  3. 20-39% stenosis noted in the left internal carotid artery.  4. Antegrade bilateral vertebral arteries.   ___________________________________________  Di Kindle. Edilia Bo, M.D.   MG/MEDQ  D:  09/21/2009  T:  09/21/2009  Job:  [id number]

## 2010-12-25 NOTE — Telephone Encounter (Signed)
See below.  Will try and touch base with patient on 5/21.

## 2010-12-25 NOTE — Procedures (Signed)
CAROTID DUPLEX EXAM   INDICATION:  Followup of known carotid artery disease.   HISTORY:  Diabetes:  No  Cardiac:  No  Hypertension:  No  Smoking:  Yes  Previous Surgery:  No  CV History:  The patient had CVA in 2004.  Amaurosis Fugax  No, Paresthesias No, Hemiparesis Yes                                       RIGHT             LEFT  Brachial systolic pressure:         124  Brachial Doppler waveforms:         WNL  Vertebral direction of flow:        antegrade         antegrade  DUPLEX VELOCITIES (cm/sec)  CCA peak systolic                   50                109  ECA peak systolic                   85                93  ICA peak systolic                   380               82  ICA end diastolic                   153               38  PLAQUE MORPHOLOGY:                  heterogenous      calcified  PLAQUE AMOUNT:                      severe            mild  PLAQUE LOCATION:                    Bif, ICA          Bif,  ICA   IMPRESSION:  1. Known 80-99% right internal carotid artery stenosis.  2. 20-39%  left internal carotid artery stenosis.        ___________________________________________  Di Kindle. Edilia Bo, M.D.   AC/MEDQ  D:  02/21/2009  T:  02/21/2009  Job:  [id number]

## 2010-12-25 NOTE — Procedures (Signed)
CAROTID DUPLEX EXAM   INDICATION:  Followup, known carotid artery disease.  Known severe right  ICA stenosis.  Patient is asymptomatic.   HISTORY:  Diabetes:  No.  Cardiac:  No.  Hypertension:  No.  Smoking:  Yes, one-half pack per day.  Previous Surgery:  No.  CV History:  Patient had CVA in 2004, leaving him with residual left-  sided weakness and hemiparesis.  Amaurosis Fugax No, Paresthesias No, Hemiparesis No                                       RIGHT             LEFT  Brachial systolic pressure:         90                90  Brachial Doppler waveforms:         WNL               WNL  Vertebral direction of flow:        Antegrade         Antegrade  DUPLEX VELOCITIES (cm/sec)  CCA peak systolic                   43                76  ECA peak systolic                   63                82  ICA peak systolic                   340               76  ICA end diastolic                   157               37  PLAQUE MORPHOLOGY:                  Mixed, calcific   Calcific  PLAQUE AMOUNT:                      Severe            Mild  PLAQUE LOCATION:                    Bifurcation, ICA  Bifurcation, ICA   IMPRESSION:  1. Known right 80-99% internal carotid artery stenosis.  2. Left 1-39% internal carotid artery stenosis.  3. Patent external carotid arteries.  4. Antegrade flow in vertebral arteries.  5. Study essentially changed from that on August 14, 2005.   ___________________________________________  Di Kindle. Edilia Bo, M.D.   PB/MEDQ  D:  08/25/2007  T:  08/25/2007  Job:  [id number]

## 2010-12-25 NOTE — Assessment & Plan Note (Signed)
OFFICE VISIT   Robert Newton, Robert Newton  DOB:  1946/02/10                                       09/21/2009  ZOXWR#:[id number]   I saw the patient in the office today for continued followup of his  carotid disease.  He has a known greater than 80% right carotid stenosis  with known distal intracranial carotid disease and has been previously  felt not to be a candidate for carotid endarterectomy based on his  distal disease.  On his most recent carotid duplex scan in July 2010, he  had a greater than 80% right internal carotid artery stenosis with no  significant stenosis on the left.  He was sent back to the [facility] to be evaluated for right carotid endarterectomy.   This patient had a significant right hemispheric stroke in 2004  associated with significant left-sided weakness.  Over the years, these  symptoms have gradually improved and he essentially remains with some  mild left lower extremity weakness and left upper extremity weakness.  He has had no new neurologic symptoms.  He has had no history of TIAs,  expressive or receptive aphasia or amaurosis fugax.   PAST MEDICAL HISTORY:  Significant for hypertension and hyperlipidemia,  both of which have been well-controlled.  He is followed at the [facility].  In addition, he has a history of low back pain  which has been stable.  He denies any history of diabetes, history of  previous myocardial infarction, history of congestive heart failure or  history of COPD.   SOCIAL HISTORY:  He is married.  He does not have any children.  He  smokes a third of a pack per day for cigarettes and has been smoking for  40 years.   REVIEW OF SYSTEMS:  GENERAL:  He had some mild weight loss.  He is 165  pounds.  He has had some loss of appetite.  CARDIOVASCULAR:  He had no chest pain, chest pressure, palpitations or  arrhythmias.  He had no significant dyspnea on exertion.  He has had no  history of DVT or phlebitis.  Again, no history of claudication,  although his activity is quite limited.  PULMONARY:  He has had no productive cough, bronchitis, asthma or  wheezing.   PHYSICAL EXAMINATION:  This is a pleasant 65 year old gentleman who  appears his stated age.  Heart rate is 93, blood pressure 137/84,  saturation 99%.  HEENT: Unremarkable.  Lungs:  Clear bilaterally to  auscultation without rales, rhonchi or wheezing.  Cardiovascular:  I do  not detect any carotid bruits.  Has a regular rate and rhythm.  He has  palpable radial pulse with no left radial pulse.  He has palpable  femoral pulses with palpable dorsalis pedis pulses but no posterior  tibial pulses bilaterally.  He has no significant lower extremity  swelling.  Abdomen:  Soft and nontender with normal pitched bowel  sounds.  Neurologic:  He has some mild weakness to deltoid, triceps,  biceps and grip in the left arm and mild iliopsoas weakness in dorsi and  plantar flexion of the left lower extremity.  Skin:  There are no ulcers  or rashes.   I independently interpreted his carotid duplex scan which shows a  greater than 80% right carotid stenosis.  The internal carotid artery  above this level looks reasonable, although there is poor diastolic flow  in the distal internal carotid artery suggesting severe distal disease.  He has no significant stenosis on the left side.  Both vertebral  arteries are patent with normally directed flow.   I have explained that we could consider repeating his cerebral  arteriogram in order to assess him for possible carotid endarterectomy.  I reviewed his previous cerebral arteriogram from 2004, and it appears  there is severe distal disease.  However, I do not think it would be  unreasonable to reassess this with the potential advantage being that if  he was a candidate for carotid endarterectomy we could potentially stop  his Coumadin.  I have explained that the alternative  is to not pursue  this further but to continue his Coumadin given that he has not had any  further problems since his stroke in 2004 as long as he has been on the  Coumadin.  We have discussed the potential complications of cerebral  arteriography including the small risk (1%) of stroke.  For now he is  comfortable continuing with his current treatment with Coumadin.  I will  see him back in 6 months with a followup carotid duplex scan.  If he  reconsiders pursuing cerebral arteriography, he will let us know.  He  does know to also continue taking his aspirin and knows the importance  of tobacco cessation.     Di Kindle. Edilia Bo, M.D.  Electronically Signed   CSD/MEDQ  D:  09/21/2009  T:  09/22/2009  Job:  [id number]   cc:   Lars Mage, MD
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
## 2010-12-28 NOTE — Consult Note (Signed)
Robert Newton, Robert Newton                            ACCOUNT NO.:  [id number]   MEDICAL RECORD NO.:  [id number]                   PATIENT TYPE:  INP   LOCATION:  3006                                 FACILITY:  [facility]   PHYSICIAN:  Di Kindle. Edilia Bo, M.D.        DATE OF BIRTH:  02-22-1946   DATE OF CONSULTATION:  11/30/2002  DATE OF DISCHARGE:                                   CONSULTATION   REASON FOR CONSULTATION:  Right hemispheric stroke with right carotid  stenosis.   HISTORY:  This is a 65 year old gentleman who was admitted on 11/29/2002  after he had been staggering around at home and fell.  He was subsequently  found to have a left hemiparesis.  The patient denies any previous history  of stroke, TIAs, expressive or receptive aphasia, or amaurosis fugax.  He  did have significant expressive aphasia associated with this stroke also,  and this, according to the wife, has improved somewhat.   As part of his workup, he has undergone a carotid duplex scan, which showed  a mild, less than 50%, left carotid stenosis with approximately 80% right  carotid stenosis.  Of note, he was also noted to have intimal thickening in  the common carotid artery on the right, and there was very little flow  distally in the right internal carotid artery, suggesting either a string  sign or even possibly an occlusion.  There was also dampened flow noted in  the proximal common carotid artery.  Of note, he also underwent an MRA as a  part of his workup, which did suggest some significant intracranial disease  in the right, involving the middle cerebral artery.  The workup also  included a CT scan of the head, which there was no evidence of edema or  hemorrhage.  No acute ischemic changes were noted on the initial head CT.   PAST MEDICAL HISTORY:  Fairly unremarkable.  His wife says that he had  problems with hypertension approximately 10 years ago, but the medication  was stopped because his blood  pressure was fine.  He denies any history of  diabetes, hypercholesterolemia, history of previous myocardial infarction,  or history of congestive heart failure.   CURRENT MEDICATIONS:  He is currently on no medications except for Milk of  Magnesia as needed.   SOCIAL HISTORY:  He is married.  He had one child who died seven years ago.  He has two step-children.  He does smoke tobacco; he states that one pack  lasts him one and one-half days.   FAMILY HISTORY:  There is no history of premature cardiac disease.  Apparently, there is a history of strokes in the family.   REVIEW OF SYSTEMS:  He denies any history of chest pain or chest pressure.  He has had no problems with bronchitis, asthma, or wheezing.  He has had no  recent dysuria or frequency.  He has had no  problems with arthritis.  He has  had no problems with DVT or phlebitis in the past.  He has no claudication  or rest pain.  Occasionally, he has problems with indigestion.  He has had  no recent change in his bowel habits.  Review of systems is otherwise  negative.   PHYSICAL EXAMINATION:  VITAL SIGNS:  Temperature 97.9, heart rate 63, and  blood pressure 117/71.  HEENT:  I do not detect any carotid bruits.  LUNGS:  Clear bilaterally to auscultation.  CARDIAC EXAM:  He has a regular rate and rhythm.  ABDOMEN:  Soft and nontender.  EXTREMITIES:  He has palpable femoral, popliteal, and dorsalis pedis pulses  bilaterally.  NEUROLOGIC:  He has a dense left hemiparesis.  He has normal motor strength  on the right side.   IMPRESSION:  This patient has had a significant right hemispheric stroke  associated with left hemiparesis.  The duplex scan suggests an 80% right  internal carotid artery stenosis; however, there is also a suggestion of  significant distal disease within the internal carotid artery and also  intracranial disease on his magnetic resonance angiography.  I would not  recommend right carotid endarterectomy for  at least six to eight weeks,  given the risk of a reperfusion bleed.  I would recommend he continue his  therapy and be reconsidered for carotid endarterectomy in six to eight  weeks.  He will have to undergo a preoperative cerebral arteriogram in order  to determine if he is a surgical candidate, given the intracranial disease  and distal internal carotid artery disease seen on duplex and magnetic  resonance image.  The cerebral arteriogram could either be done during this  admission, which may be the most efficient way to do it if the plan is to  place him on Coumadin; otherwise, this arteriogram can be arranged in six to  eight weeks in order to determine if he is a surgical candidate.   Thanks for allowing me to participate in this nice patient's care.                                                Di Kindle. Edilia Bo, M.D.    CSD/MEDQ  D:  11/30/2002  T:  12/01/2002  Job:  [id number]

## 2010-12-28 NOTE — Discharge Summary (Signed)
   Robert Newton, Robert Newton                            ACCOUNT NO.:  [id number]   MEDICAL RECORD NO.:  [id number]                   PATIENT TYPE:  IPS   LOCATION:  4032                                 FACILITY:  [facility]   PHYSICIAN:  Annie Main, N.P.                   DATE OF BIRTH:  [birth year]   DATE OF ADMISSION:  DATE OF DISCHARGE:  12/20/2002                                 DISCHARGE SUMMARY   ADDENDUM:  This is an addendum to a discharge summary from his acute stay. The patient  was  found to have right greater than 80% carotid artery stenosis as well as  less than  50% left internal carotid artery stenosis. The CVTS, Dr. Edilia Bo,  was consulted for management of this. Plans were made for right cerebral  arteriogram in the future, the question arose when cerebral arteriogram  showed severe right-sided anterior cerebrovascular disease, question the  benefit of a right carotid endarterectomy.   Dr. Edilia Bo has now reviewed the study. Initially he felt that there was  poor filling because of the severe right ICA stenosis. However, he review  the films and now agrees with the initial reading of severe intracranial  disease on the right. He now no longer feels that a right carotid  endarterectomy would benefit the patient or decrease the risks of future  stroke. Plans are for Dr. Edilia Bo to follow the patient at discharge from  rehabilitation to follow left carotid stenosis and possible surgery at that  time.   During the cerebral arteriogram, Dr. Corliss Skains recommended intracranial SCA  stent as well as carotid stenting. Will need to consider that at the time of  return to see Dr. Thad Ranger.                                                Annie Main, N.P.    SB/MEDQ  D:  12/20/2002  T:  12/21/2002  Job:  [id number]   cc:   Casimiro Needle L. Thad Ranger, M.D.  [address]  [address]  [place]  [place] [address]  Fax: [phone]
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
## 2010-12-28 NOTE — Consult Note (Signed)
Robert Newton, Robert Newton                            ACCOUNT NO.:  [id number]   MEDICAL RECORD NO.:  [id number]                   PATIENT TYPE:  REC   LOCATION:                                       FACILITY:  [facility]   PHYSICIAN:  Robert Newton, M.D.           DATE OF BIRTH:  Jan 10, 1946   DATE OF CONSULTATION:  07/14/2003  DATE OF DISCHARGE:                                   CONSULTATION   PAIN MANAGEMENT OFFICE NOTE:   HISTORY OF PRESENT ILLNESS:  [name] is a 65 year old black male  returning for office follow up after recent appointment, May 02, 2003.  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  He had been on inpatient rehabilitation services December 02, 2002 to Dec 24, 2002.  He continues to have some left hemiparesis but is  making gradual gains receiving outpatient physical and occupational therapy  two days a week, wearing an AFO brace to the left lower extremity.  He  remains on Coumadin therapy for his cerebrovascular accident, followed by  Dr. Wilburn Newton.  His wife stated a recent INR was mildly elevated at 3.4.  Medication was adjusted to 3 mg of daily of Coumadin therapy.  He still has  intermittent left knee pain but this has improved dramatically with the use  of physical therapy, moist heat and sports topical creams.   PAST MEDICAL HISTORY:  1. Asthma with tobacco abuse.  2. Dyslipidemia.  3. Recent cerebrovascular accident.   MEDICATIONS:  1. Lipitor 40 mg daily.  2. Coumadin 3 mg daily.   ALLERGIES:  None.   SOCIAL HISTORY:  [name] resides with his wife in [place].  His  disability forms have been accepted.  He is now receiving disability.  His  wife does assist with some activities of daily living due to left  hemiparesis, needing some assistance for lower body and upper body dressing.  He is independent with feeding himself.  He is stand-by assist to sit to  stand.  Stand-by assist for  ambulation with a quad cane.  The patient notes  he is also driving some short distances of which this was discussed with  patient the need for safety and also discussed with wife.  He continues to  smoke about one pack per day every two days.   REVIEW OF SYSTEMS:  No chest pain, no nausea or vomiting.  No swallowing  difficulties.  Denies any bowel or bladder disturbances.  No dizziness.   PHYSICAL EXAMINATION:  VITAL SIGNS:  Blood pressure 119/86, pulse 98,  respiration 16, oxygen saturation is 98% on room air.  GENERAL:  This is an alert, well-developed, black male who is oriented x3.  He is pleasant and appropriate.  He has no dysarthria and fully  intelligible.  Wife felt patient's speech was at baseline prior to  cerebrovascular accident.  His mood  is appropriate.  His strength to the  right upper and lower extremities remains at 5 out of 5.  Left upper  extremity is showing gradual improvement.  He had a mild subluxation to the  left shoulder, no discomfort with passive range of motion.  His left biceps  is 4 minus out of 5, triceps 2 to 3 minus out of 5.  He is somewhat slow to  initiate grasp but graded 4 plus out of 5.  Left lower extremity quadriceps  and hips 3 minus 3 out of 5.  He has no ankle plantar flexion or  dorsiflexion.  His sensation is intact to light touch.  His calves are cool  without swelling, erythema and nontender.  LUNGS:  Clear to auscultation.  CARDIAC:  Regular rate and rhythm.  ABDOMEN:  Soft, nontender with good bowel sounds.   IMPRESSION:  1. Right subcortical cerebrovascular accident with left hemiparesis with     gradual improvement.  2. Dyslipidemia.  3. Tobacco abuse.  4. Left knee pain improved.   PLAN:  The patient will return [facility] in two months for follow up with Dr. Wynn Newton.  He is  to continue with physical and occupational therapy to promote overall  mobility and well being x2 a  week.  He should continue his Coumadin therapy  and follow up routinely as advised at the [facility] outpatient clinic.  He  should remain on his Lipitor for dyslipidemia.  The need for smoking  cessation was discussed at length but it was questionable if he would be  compliant with these requests.  He was also advised no driving due to safety  risks, this was discussed with the patient and his wife.     Robert Newton, P.A.                     Robert Newton, M.D.    DA/MEDQ  D:  07/14/2003  T:  07/14/2003  Job:  [id number]   cc:   [place] OUTPATIENT/FAMILY MED.

## 2010-12-28 NOTE — Consult Note (Signed)
CONSULTING PHYSICIAN:  Erick Colace, M.D.   REASON FOR CONSULTATION:  The patient is a 65 year old black male returning  for an office follow-up visit after his latest appointment on July 14, 2003.  The patient has a history of a right subcortical cerebrovascular  accident with left hemiparesis.  The patient has no new significant problems  at this time.  The patient presents to the office in a wheelchair and also  demonstrating that he can ambulate with a cane short distances.  The patient  occasionally complains of left upper arm weakness and occasional achiness.  The patient's last physical therapy and occupational therapy visit was two  weeks ago and he has been discharged from outpatient  physical therapy and  occupational therapy due to minimal progress and plateau in progress.  The  patient continues to take his Coumadin for cerebrovascular accident  prophylaxis without any bleeding complications by Dr. Wilburn Cornelia.  The patient  did note that he has had two weeks complaining of mild dysphagia and had an  MBS performed last week and results are pending at this time.  The patient  still continues to smoke despite the increased risks of a repeat  cerebrovascular accident.  The patient overall has improved dramatically  since initial discharge stay in the hospital on Dec 24, 2002.   PAST MEDICAL HISTORY:  1. Asthma.  2. Tobacco use.  3. Dyslipidemia.  4. Recent right cerebrovascular accident.   MEDICATIONS:  1. Coumadin 3 mg daily.  2. Lipitor 40 mg daily.   ALLERGIES:  None.   SOCIAL HISTORY:  The patient still resides with his wife.  His disability  forms have been accepted and he is now receiving disability. His wife does  assist with some activities of daily living due to left hemiparesis and he  still needs some assistance on lower body and upper body dressing.  He is  independent with feeding himself and he can transfer from sit to stand,  modified independently,  and can ambulate basically modified independently  with a cane.  As stated above, the patient continues to smoke approximately  one pack every two days.   REVIEW OF SYMPTOMS:  GI:  Significant for dysphagia.  NEUROLOGIC:  Significant for left shoulder weakness.   PHYSICAL EXAMINATION:  VITAL SIGNS:  Blood pressure 124/63, pulse 84.  GENERAL EXAM:  The patient is a pleasant and alert African-American male  sitting comfortable in his wheelchair.  He is in no acute distress and is  oriented times three.  HEART:  Positive S1 and S2 are noted.  Regular rate and rhythm.  LUNGS:  Clear.  NEUROLOGIC:  The patient still has strength in the right upper extremity and  lower extremity which is 5/5.  The left upper extremity shows some  improvement.  Still the patient is able to raise his arm about halfway above  his chest.  He has maybe a 3 to -4/5 of biceps and triceps.  The patient  does have improvement in his grip, maybe 3- to 4-/5.  The left lower  extremity quadriceps and heel is about 3-/5.  He has no plantar flexion or  dorsiflexion.  The patient is presently wearing his AFO on his left lower  extremity.   IMPRESSION AND PLAN:  1. Right subcortical cerebrovascular accident with still mild left     hemiparesis with good improvement overall. He will follow-up with Dr.     Wynn Banker in two months.  The patient is to do  FES at the outpatient     center to help the left upper extremity paresis and weakness.  The     patient is to continue to wear the left AFO.  He is to continue to     ambulate with a cane for short distances and wheelchair for long     distances. The patient is to continue Coumadin per Dr. Wilburn Cornelia.  2. Dyslipidemia.  The patient is to continue Lipitor 20 mg daily.  3. Tobacco abuse.  We have encouraged the patient to discontinue smoking.     We explained to the patient the disadvantages of smoking and the     increased risk for another cerebrovascular accident.       Drucilla Schmidt, P.A.  LB/MedQ  D:09/22/2003 14:05:48  T:09/22/2003 14:53:59  Job #:  [id number]
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
## 2010-12-28 NOTE — H&P (Signed)
Robert Newton, Robert Newton                            ACCOUNT NO.:  [id number]   MEDICAL RECORD NO.:  [id number]                   PATIENT TYPE:  REC   LOCATION:  TPC                                  FACILITY:  [facility]   PHYSICIAN:  Erick Colace, M.D.           DATE OF BIRTH:  1945-08-25   DATE OF ADMISSION:  05/11/2003  DATE OF DISCHARGE:                                HISTORY & PHYSICAL   HISTORY OF PRESENT ILLNESS:  This is a follow up visit for Robert Newton a 65-  year-old black Newton with a history of right nucleus infarct with  subsequent left hemiparesis and dysarthria.  Currently dysarthria is hardly  noted.  He does continue with left hemiparesis.  Reports he has had increase  in return of strength of the left upper extremity.  He does continue to  require AFO for his left lower extremity, but reports that has not been  using it consistently.  Currently, he is undergoing outpatient therapy which  include PT and OT.  He has had Coumadin monitoring by Dr. Tish Men, and his  INR has been pretty stable for the past month.  He remains on Lipitor for  dyslipidemia.  Wife reports they have not used Vioxx at all secondary to  concerns regarding side effects, will follow up on this medication.  His  bursitis in the left knee has improved.   PAST MEDICAL HISTORY:  1. Asthma.  2. Dyslipidemia.  3. Recent stroke.   MEDICATIONS:  1. Lipitor 40 mg a day.  2. Coumadin 3 mg a day.   ALLERGIES:  No known drug allergies.   SOCIAL HISTORY:  The patient currently residing with wife in [place].  Still awaiting disability with hopes for it to go through in November 2004.  He is currently fully independent for ADL's.  He is independent for  transfers.  Occasional stand-by assist for car transfers.  Unable to  ambulate without assistance.   REVIEW OF SYSTEMS:  The patient reports pain in the left shoulder has  resolved.  Does have intermittent achiness in the left knee.  He is  continent of bowel and bladder.  Reports vision in left eye is improved with  new prescription glasses for reading.   PHYSICAL EXAMINATION:  VITAL SIGNS:  Blood pressure 101/67, pulse 77,  respirations 16, O2 sats 100% on room air.  GENERAL:  He is A&O x3.  Pleasant and appropriate.  No signs of dysarthria.  Mood is reported to be stable.  EXTREMITIES:  Strength in right upper extremity and right lower extremity  remains at 5/5.  Strength in left upper extremity is improving.  Deltoid  shows some activation.  His left biceps is at 4/5, triceps is at 2 to 3-,  grips are slow to initiate, but is at 4+/5.  His left lower extremity knee  extension remains at 3-/5.  Hip flexion is at 2-/5,  most due to hip height.  Ankle shows no signs of plantar flexion or dorsiflexion.   IMPRESSION:  1. Right subcortical cerebrovascular accident with left hemiparesis.     Minimal shoulder subluxation.  2. Dyslipidemia.    PLAN:  1. Progressive physical therapy and occupational therapy to continue on     outpatient basis.  2. Continue Coumadin and Lipitor for now.  The patient has been advised     regarding necessity to continue medications on daily and regular basis to     avoid recurrent stroke.  3. The patient is to follow up in one month for a recheck.      Robert Newton, P.A.                    Erick Colace, M.D.    PP/MEDQ  D:  05/12/2003  T:  05/13/2003  Job:  [id number]

## 2010-12-31 ENCOUNTER — Ambulatory Visit (INDEPENDENT_AMBULATORY_CARE_PROVIDER_SITE_OTHER): Payer: PRIVATE HEALTH INSURANCE | Admitting: Pharmacist

## 2010-12-31 DIAGNOSIS — I6789 Other cerebrovascular disease: Secondary | ICD-10-CM

## 2010-12-31 DIAGNOSIS — Z7901 Long term (current) use of anticoagulants: Secondary | ICD-10-CM

## 2010-12-31 LAB — POCT INR: INR: 3.2

## 2010-12-31 NOTE — Progress Notes (Signed)
Anti-Coagulation Progress Note  Robert Newton is a 65 y.o. male who is currently on an anti-coagulation regimen.    RECENT RESULTS: Recent results are below, the most recent result is correlated with a dose of 21 mg. per week: Lab Results  Component Value Date   INR 3.2 12/31/2010   INR 2.8 12/03/2010   INR 2.4 10/29/2010    ANTI-COAG DOSE:   Latest dosing instructions   Total Sun Mon Tue Wed Thu Fri Sat   19.5 3 mg 3 mg 3 mg 1.5 mg 3 mg 3 mg 3 mg    (3 mg1) (3 mg1) (3 mg1) (3 mg0.5) (3 mg1) (3 mg1) (3 mg1)         ANTICOAG SUMMARY: Anticoagulation Episode Summary              Current INR goal 2.0-3.0 Next INR check 01/28/2011   INR from last check 3.2! (12/31/2010)     Weekly max dose (mg)  Target end date Indefinite   Indications CEREBROVASCULAR ACCIDENT, ACUTE, Long term current use of anticoagulant   IN[facility] check location [facility] Preferred lab    Send INR reminders to [facility]   Comments        Provider Role Specialty Phone number   Blanch Media  Internal Medicine [phone]        ANTICOAG TODAY: Anticoagulation Summary as of 12/31/2010              INR goal 2.0-3.0     Selected INR 3.2! (12/31/2010) Next INR check 01/28/2011   Weekly max dose (mg)  Target end date Indefinite   Indications CEREBROVASCULAR ACCIDENT, ACUTE, Long term current use of anticoagulant    Anticoagulation Episode Summary              INR check location [facility] Preferred lab    Send INR reminders to [facility]   Comments        Provider Role Specialty Phone number   Blanch Media  Internal Medicine [phone]        PATIENT INSTRUCTIONS: Patient Instructions  Patient instructed to take medications as defined in the Anti-coagulation Track section of this encounter.  Patient instructed to take today's dose.  Patient verbalized understanding of these instructions.        FOLLOW-UP Return in 4 weeks (on 01/28/2011) for Follow up INR.  Hulen Luster, III Pharm.D., CACP

## 2010-12-31 NOTE — Telephone Encounter (Signed)
Robert Newton said patient was in and out today, did not mention smoking cessation counseling and was not having success with Chantix.  I will send him information in the mail regarding the Quitline and ask him to contact me.  I have already left him two messages with no return phone call.

## 2010-12-31 NOTE — Patient Instructions (Signed)
Patient instructed to take medications as defined in the Anti-coagulation Track section of this encounter.  Patient instructed to take today's dose.  Patient verbalized understanding of these instructions.    

## 2011-01-28 ENCOUNTER — Ambulatory Visit (INDEPENDENT_AMBULATORY_CARE_PROVIDER_SITE_OTHER): Payer: PRIVATE HEALTH INSURANCE | Admitting: Pharmacist

## 2011-01-28 DIAGNOSIS — I6789 Other cerebrovascular disease: Secondary | ICD-10-CM

## 2011-01-28 DIAGNOSIS — Z7901 Long term (current) use of anticoagulants: Secondary | ICD-10-CM

## 2011-01-28 LAB — POCT INR: INR: 2.7

## 2011-01-28 MED ORDER — WARFARIN SODIUM 3 MG PO TABS: ORAL_TABLET | ORAL | Status: DC

## 2011-01-28 NOTE — Progress Notes (Signed)
Anti-Coagulation Progress Note  Robert Newton is a 65 y.o. male who is currently on an anti-coagulation regimen.    RECENT RESULTS: Recent results are below, the most recent result is correlated with a dose of 19.5 mg. per week: Lab Results  Component Value Date   INR 2.70 01/28/2011   INR 3.2 12/31/2010   INR 2.8 12/03/2010    ANTI-COAG DOSE:   Latest dosing instructions   Total Sun Mon Tue Wed Thu Fri Sat   19.5 3 mg 3 mg 3 mg 1.5 mg 3 mg 3 mg 3 mg    (3 mg1) (3 mg1) (3 mg1) (3 mg0.5) (3 mg1) (3 mg1) (3 mg1)         ANTICOAG SUMMARY: Anticoagulation Episode Summary              Current INR goal 2.0-3.0 Next INR check 02/25/2011   INR from last check 2.70 (01/28/2011)     Weekly max dose (mg)  Target end date Indefinite   Indications CEREBROVASCULAR ACCIDENT, ACUTE, Long term current use of anticoagulant   [facility] location [facility] Preferred lab    Send INR reminders to [facility]   Comments        Provider Role Specialty Phone number   Blanch Media  Internal Medicine [phone]        ANTICOAG TODAY: Anticoagulation Summary as of 01/28/2011              INR goal 2.0-3.0     Selected INR 2.70 (01/28/2011) Next INR check 02/25/2011   Weekly max dose (mg)  Target end date Indefinite   Indications CEREBROVASCULAR ACCIDENT, ACUTE, Long term current use of anticoagulant    Anticoagulation Episode Summary              INR check location [facility] Preferred lab    Send INR reminders to [facility]   Comments        Provider Role Specialty Phone number   Blanch Media  Internal Medicine [phone]        PATIENT INSTRUCTIONS: Patient Instructions  Patient instructed to take medications as defined in the Anti-coagulation Track section of this encounter.  Patient instructed to take today's dose.  Patient verbalized understanding of these instructions.        FOLLOW-UP Return in 4 weeks (on 02/25/2011) for Follow up INR.  Hulen Luster, III Pharm.D., CACP

## 2011-01-28 NOTE — Patient Instructions (Signed)
Patient instructed to take medications as defined in the Anti-coagulation Track section of this encounter.  Patient instructed to take today's dose.  Patient verbalized understanding of these instructions.    

## 2011-02-25 ENCOUNTER — Ambulatory Visit (INDEPENDENT_AMBULATORY_CARE_PROVIDER_SITE_OTHER): Payer: PRIVATE HEALTH INSURANCE | Admitting: Pharmacist

## 2011-02-25 DIAGNOSIS — Z7901 Long term (current) use of anticoagulants: Secondary | ICD-10-CM

## 2011-02-25 DIAGNOSIS — I6789 Other cerebrovascular disease: Secondary | ICD-10-CM

## 2011-02-25 LAB — POCT INR: INR: 3.7

## 2011-02-25 MED ORDER — WARFARIN SODIUM 3 MG PO TABS: ORAL_TABLET | ORAL | Status: DC

## 2011-02-25 NOTE — Progress Notes (Signed)
Anti-Coagulation Progress Note  Robert Newton is a 65 y.o. male who is currently on an anti-coagulation regimen.    RECENT RESULTS: Recent results are below, the most recent result is correlated with a dose of 21.5 mg. per week: Lab Results  Component Value Date   INR 3.7 02/25/2011   INR 2.70 01/28/2011   INR 3.2 12/31/2010    ANTI-COAG DOSE:   Latest dosing instructions   Total Sun Mon Tue Wed Thu Fri Sat   18 3 mg 3 mg 1.5 mg 3 mg 1.5 mg 3 mg 3 mg    (3 mg1) (3 mg1) (3 mg0.5) (3 mg1) (3 mg0.5) (3 mg1) (3 mg1)         ANTICOAG SUMMARY: Anticoagulation Episode Summary              Current INR goal 2.0-3.0 Next INR check 03/11/2011   INR from last check 3.7! (02/25/2011)     Weekly max dose (mg)  Target end date Indefinite   Indications CEREBROVASCULAR ACCIDENT, ACUTE, Long term current use of anticoagulant   INR check location [facility] Preferred lab    Send INR reminders to [facility]   Comments        Provider Role Specialty Phone number   Blanch Media  Internal Medicine [phone]        ANTICOAG TODAY: Anticoagulation Summary as of 02/25/2011              INR goal 2.0-3.0     Selected INR 3.7! (02/25/2011) Next INR check 03/11/2011   Weekly max dose (mg)  Target end date Indefinite   Indications CEREBROVASCULAR ACCIDENT, ACUTE, Long term current use of anticoagulant    Anticoagulation Episode Summary              INR check location [facility] Preferred lab    Send INR reminders to [facility] IMP   Comments        Provider Role Specialty Phone number   Blanch Media  Internal Medicine [phone]        PATIENT INSTRUCTIONS: Patient Instructions  Patient instructed to take medications as defined in the Anti-coagulation Track section of this encounter.  Patient instructed to take today's dose.  Patient verbalized understanding of these instructions.        FOLLOW-UP Return in 2 weeks (on 03/11/2011) for Follow up  INR.  Hulen Luster, III Pharm.D., CACP

## 2011-02-25 NOTE — Patient Instructions (Signed)
Patient instructed to take medications as defined in the Anti-coagulation Track section of this encounter.  Patient instructed to take today's dose.  Patient verbalized understanding of these instructions.    

## 2011-03-11 ENCOUNTER — Ambulatory Visit (INDEPENDENT_AMBULATORY_CARE_PROVIDER_SITE_OTHER): Payer: PRIVATE HEALTH INSURANCE | Admitting: Pharmacist

## 2011-03-11 DIAGNOSIS — I6789 Other cerebrovascular disease: Secondary | ICD-10-CM

## 2011-03-11 DIAGNOSIS — Z7901 Long term (current) use of anticoagulants: Secondary | ICD-10-CM

## 2011-03-11 NOTE — Patient Instructions (Signed)
Patient instructed to take medications as defined in the Anti-coagulation Track section of this encounter.  Patient instructed to take today's dose.  Patient verbalized understanding of these instructions.    

## 2011-03-11 NOTE — Progress Notes (Signed)
Anti-Coagulation Progress Note  Robert Newton is a 65 y.o. male who is currently on an anti-coagulation regimen.    RECENT RESULTS: Recent results are below, the most recent result is correlated with a dose of 18 mg. per week: Lab Results  Component Value Date   INR 3.3 03/11/2011   INR 3.7 02/25/2011   INR 2.70 01/28/2011    ANTI-COAG DOSE:   Latest dosing instructions   Total Sun Mon Tue Wed Thu Fri Sat   16.5 3 mg 3 mg 1.5 mg 3 mg 1.5 mg 3 mg 1.5 mg    (3 mg1) (3 mg1) (3 mg0.5) (3 mg1) (3 mg0.5) (3 mg1) (3 mg0.5)         ANTICOAG SUMMARY: Anticoagulation Episode Summary              Current INR goal 2.0-3.0 Next INR check 04/08/2011   INR from last check 3.3! (03/11/2011)     Weekly max dose (mg)  Target end date Indefinite   Indications CEREBROVASCULAR ACCIDENT, ACUTE, Long term current use of anticoagulant   [facility] check location [facility]    Send INR reminders to [facility]   Comments        Provider Role Specialty Phone number   Blanch Media  Internal Medicine [phone]        ANTICOAG TODAY: Anticoagulation Summary as of 03/11/2011              INR goal 2.0-3.0     Selected INR 3.3! (03/11/2011) Next INR check 04/08/2011   Weekly max dose (mg)  Target end date Indefinite   Indications CEREBROVASCULAR ACCIDENT, ACUTE, Long term current use of anticoagulant    Anticoagulation Episode Summary              IN[facility] check location [facility] Pref[facility] lab    Send INR reminders to [facility]   Comments        Provider Role Specialty Phone number   Blanch Media  Internal Medicine [phone]        PATIENT INSTRUCTIONS: Patient Instructions  Patient instructed to take medications as defined in the Anti-coagulation Track section of this encounter.  Patient instructed to take today's dose.  Patient verbalized understanding of these instructions.        FOLLOW-UP Return in 4 weeks (on 04/08/2011) for Follow up  INR.  Hulen Luster, III Pharm.D., CACP

## 2011-03-26 ENCOUNTER — Telehealth: Payer: Self-pay | Admitting: *Deleted

## 2011-03-26 ENCOUNTER — Encounter: Payer: Self-pay | Admitting: Internal Medicine

## 2011-03-26 ENCOUNTER — Ambulatory Visit (INDEPENDENT_AMBULATORY_CARE_PROVIDER_SITE_OTHER): Payer: PRIVATE HEALTH INSURANCE | Admitting: Internal Medicine

## 2011-03-26 VITALS — BP 120/79 | HR 81 | Temp 97.9°F | Ht 73.0 in | Wt 163.8 lb

## 2011-03-26 DIAGNOSIS — J441 Chronic obstructive pulmonary disease with (acute) exacerbation: Secondary | ICD-10-CM | POA: Insufficient documentation

## 2011-03-26 NOTE — Telephone Encounter (Signed)
Pt's spouse calls and states pt has a cough for over 1 month, has not been productive until the last week. Has used otc's w/ no relief. Denies fevers, all other symptoms except being tired. appt given for 1545 today

## 2011-03-26 NOTE — Progress Notes (Signed)
  Subjective:    Patient ID: Robert Newton, male    DOB: [birth year], 65 y.o.   MRN: [id number]  HPI Patient is 65 year old male with past medical history outlined below who presents to clinic with main concern of productive cough of yellowish to greenish sputum approximately 7 days in duration associated with subjective fevers and chills. He denies smoking at this time but reports exposure to people who smoke around him and he also reports exposure to people who were sick in his household. He denies difficulty swallowing or pain with swallowing, no shortness of breath, no chest pain, no abdominal or urinary concerns. In addition he denies recent sicknesses or hospitalizations, no systemic symptoms of night sweats or weight loss, no hemoptysis.   Review of Systems Per HPI     Objective:   Physical Exam   Constitutional: Vital signs reviewed.  Patient is a well-developed and well-nourished  in no acute distress and cooperative with exam. Alert and oriented x3.  Head: Normocephalic and atraumatic Ear: TM normal bilaterally Mouth: no erythema or exudates, MMM Eyes: PERRL, EOMI, conjunctivae normal, No scleral icterus.  Neck: Supple, Trachea midline normal ROM, No JVD, mass, thyromegaly, or carotid bruit present.  Cardiovascular: RRR, S1 normal, S2 normal, no MRG, pulses symmetric and intact bilaterally Pulmonary/Chest: CTAB, no wheezes, rales, or rhonchi, coughing throughout the examination Abdominal: Soft. Non-tender, non-distended, bowel sounds are normal, no masses, organomegaly, or guarding present.  Skin: Warm, dry and intact. No rash, cyanosis, or clubbing.         Assessment & Plan:

## 2011-03-26 NOTE — Assessment & Plan Note (Signed)
Physical exam findings and the patient's symptoms most likely consistent with COPD exacerbation. Patient continues to smoke but was not smoking for the past 2 days. Productive cough was noted during the examination but lungs were clear to auscultation with no audible wheezing. Will prescribe a course of antibiotics and patient was advised to call us back if his symptoms get worse and he experiences persistent fevers and chills, if he develops hemoptysis, shortness of breath or chest pain.

## 2011-03-31 ENCOUNTER — Other Ambulatory Visit: Payer: Self-pay | Admitting: Internal Medicine

## 2011-04-08 ENCOUNTER — Telehealth: Payer: Self-pay | Admitting: Pharmacist

## 2011-04-08 ENCOUNTER — Ambulatory Visit (INDEPENDENT_AMBULATORY_CARE_PROVIDER_SITE_OTHER): Payer: PRIVATE HEALTH INSURANCE | Admitting: Pharmacist

## 2011-04-08 ENCOUNTER — Encounter: Payer: Self-pay | Admitting: Internal Medicine

## 2011-04-08 DIAGNOSIS — Z7901 Long term (current) use of anticoagulants: Secondary | ICD-10-CM

## 2011-04-08 DIAGNOSIS — I6789 Other cerebrovascular disease: Secondary | ICD-10-CM

## 2011-04-08 LAB — POCT INR: INR: 2.2

## 2011-04-08 MED ORDER — WARFARIN SODIUM 3 MG PO TABS: ORAL_TABLET | ORAL | Status: DC

## 2011-04-08 NOTE — Telephone Encounter (Signed)
Will refill Rx for his warfarin.

## 2011-04-08 NOTE — Patient Instructions (Signed)
Patient instructed to take medications as defined in the Anti-coagulation Track section of this encounter.  Patient instructed to omit today's dose.  Patient verbalized understanding of these instructions.    

## 2011-04-08 NOTE — Progress Notes (Signed)
Anti-Coagulation Progress Note  Robert Newton is a 65 y.o. male who is currently on an anti-coagulation regimen.    RECENT RESULTS: Recent results are below, the most recent result is correlated with a dose of 16.5 mg. per week: Lab Results  Component Value Date   INR 2.2 04/08/2011   INR 3.3 03/11/2011   INR 3.7 02/25/2011    ANTI-COAG DOSE:   Latest dosing instructions   Total Sun Mon Tue Wed Thu Fri Sat   18 3 mg 3 mg 1.5 mg 3 mg 1.5 mg 3 mg 3 mg    (3 mg1) (3 mg1) (3 mg0.5) (3 mg1) (3 mg0.5) (3 mg1) (3 mg1)         ANTICOAG SUMMARY: Anticoagulation Episode Summary              Current INR goal 2.0-3.0 Next INR check 04/29/2011   INR from last check 2.2 (04/08/2011)     Weekly max dose (mg)  Target end date Indefinite   Indications CEREBROVASCULAR ACCIDENT, ACUTE, Long term current use of anticoagulant   [facility] check location [facility]    Send INR reminders to [facility]   Comments        Provider Role Specialty Phone number   Blanch Media  Internal Medicine [phone]        ANTICOAG TODAY: Anticoagulation Summary as of 04/08/2011              INR goal 2.0-3.0     Selected INR 2.2 (04/08/2011) Next INR check 04/29/2011   Weekly max dose (mg)  Target end date Indefinite   Indications CEREBROVASCULAR ACCIDENT, ACUTE, Long term current use of anticoagulant    Anticoagulation Episode Summary              INR check location [facility] Preferred lab    Send INR reminders to [facility]   Comments        Provider Role Specialty Phone number   Blanch Media  Internal Medicine [phone]        PATIENT INSTRUCTIONS: Patient Instructions  Patient instructed to take medications as defined in the Anti-coagulation Track section of this encounter.  Patient instructed to omit today's dose.  Patient verbalized understanding of these instructions.        FOLLOW-UP Return in 3 weeks (on 04/29/2011) for Follow up INR.  Hulen Luster, III Pharm.D., CACP

## 2011-04-08 NOTE — Progress Notes (Signed)
Robert Newton is on chronic anticoagulation with coumadin apparently secondary to Neurosurgery's recommendation for long term anticoagulation s/p CVA.  Agree with the assessment and plan.

## 2011-04-29 ENCOUNTER — Encounter: Payer: Self-pay | Admitting: Internal Medicine

## 2011-04-29 ENCOUNTER — Ambulatory Visit (INDEPENDENT_AMBULATORY_CARE_PROVIDER_SITE_OTHER): Payer: PRIVATE HEALTH INSURANCE | Admitting: Pharmacist

## 2011-04-29 DIAGNOSIS — I6789 Other cerebrovascular disease: Secondary | ICD-10-CM

## 2011-04-29 DIAGNOSIS — Z7901 Long term (current) use of anticoagulants: Secondary | ICD-10-CM

## 2011-04-29 NOTE — Patient Instructions (Signed)
Patient instructed to take medications as defined in the Anti-coagulation Track section of this encounter.  Patient instructed to take today's dose.  Patient verbalized understanding of these instructions.    

## 2011-04-29 NOTE — Progress Notes (Signed)
Anti-Coagulation Progress Note  Robert Newton is a 65 y.o. male who is currently on an anti-coagulation regimen.    RECENT RESULTS: Recent results are below, the most recent result is correlated with a dose of 18 mg. per week: Lab Results  Component Value Date   INR 2.80 04/29/2011   INR 2.2 04/08/2011   INR 3.3 03/11/2011    ANTI-COAG DOSE:   Latest dosing instructions   Total Sun Mon Tue Wed Thu Fri Sat   16.5 3 mg 3 mg 1.5 mg 3 mg 1.5 mg 3 mg 1.5 mg    (3 mg1) (3 mg1) (3 mg0.5) (3 mg1) (3 mg0.5) (3 mg1) (3 mg0.5)         ANTICOAG SUMMARY: Anticoagulation Episode Summary              Current INR goal 2.0-3.0 Next INR check 05/27/2011   INR from last check 2.80 (04/29/2011)     Weekly max dose (mg)  Target end date Indefinite   Indications CEREBROVASCULAR ACCIDENT, ACUTE, Long term current use of anticoagulant   [facility] check location [facility]    Send INR reminders to [facility]   Comments        Provider Role Specialty Phone number   Blanch Media  Internal Medicine [phone]        ANTICOAG TODAY: Anticoagulation Summary as of 04/29/2011              INR goal 2.0-3.0     Selected INR 2.80 (04/29/2011) Next INR check 05/27/2011   Weekly max dose (mg)  Target end date Indefinite   Indications CEREBROVASCULAR ACCIDENT, ACUTE, Long term current use of anticoagulant    Anticoagulation Episode Summary              INR check location [facility]    Send INR reminders to [facility]   Comments        Provider Role Specialty Phone number   Blanch Media  Internal Medicine [phone]        PATIENT INSTRUCTIONS: Patient Instructions  Patient instructed to take medications as defined in the Anti-coagulation Track section of this encounter.  Patient instructed to take today's dose.  Patient verbalized understanding of these instructions.        FOLLOW-UP Return in 4 weeks (on 05/27/2011) for Follow up  INR.  Hulen Luster, III Pharm.D., CACP

## 2011-05-27 ENCOUNTER — Ambulatory Visit (INDEPENDENT_AMBULATORY_CARE_PROVIDER_SITE_OTHER): Payer: PRIVATE HEALTH INSURANCE | Admitting: Pharmacist

## 2011-05-27 DIAGNOSIS — I6789 Other cerebrovascular disease: Secondary | ICD-10-CM

## 2011-05-27 DIAGNOSIS — Z7901 Long term (current) use of anticoagulants: Secondary | ICD-10-CM

## 2011-05-27 LAB — POCT INR: INR: 2.2

## 2011-05-27 MED ORDER — WARFARIN SODIUM 3 MG PO TABS: ORAL_TABLET | ORAL | Status: DC

## 2011-05-27 NOTE — Progress Notes (Signed)
Anti-Coagulation Progress Note  Garrell Flagg is a 65 y.o. male who is currently on an anti-coagulation regimen.    RECENT RESULTS: Recent results are below, the most recent result is correlated with a dose of 16.5 mg. per week: Lab Results  Component Value Date   INR 2.2 05/27/2011   INR 2.80 04/29/2011   INR 2.2 04/08/2011    ANTI-COAG DOSE:   Latest dosing instructions   Total Sun Mon Tue Wed Thu Fri Sat   16.5 3 mg 3 mg 1.5 mg 3 mg 1.5 mg 3 mg 1.5 mg    (3 mg1) (3 mg1) (3 mg0.5) (3 mg1) (3 mg0.5) (3 mg1) (3 mg0.5)         ANTICOAG SUMMARY: Anticoagulation Episode Summary              Current INR goal 2.0-3.0 Next INR check 06/24/2011   INR from last check 2.2 (05/27/2011)     Weekly max dose (mg)  Target end date Indefinite   Indications CEREBROVASCULAR ACCIDENT, ACUTE, Long term current use of anticoagulant   INR check location [facility] Pref[facility]    Send INR reminders to [facility]   Comments        Provider Role Specialty Phone number   Blanch Media  Internal Medicine [phone]        ANTICOAG TODAY: Anticoagulation Summary as of 05/27/2011              INR goal 2.0-3.0     Selected INR 2.2 (05/27/2011) Next INR check 06/24/2011   Weekly max dose (mg)  Target end date Indefinite   Indications CEREBROVASCULAR ACCIDENT, ACUTE, Long term current use of anticoagulant    Anticoagulation Episode Summary              INR check location [facility]    Send INR reminders to [facility]   Comments        Provider Role Specialty Phone number   Blanch Media  Internal Medicine [phone]        PATIENT INSTRUCTIONS: Patient Instructions  Patient instructed to take medications as defined in the Anti-coagulation Track section of this encounter.  Patient instructed to take today's dose.  Patient verbalized understanding of these instructions.        FOLLOW-UP Return in 4 weeks (on 06/24/2011) for Follow up  INR.  Hulen Luster, III Pharm.D., CACP

## 2011-05-27 NOTE — Patient Instructions (Signed)
Patient instructed to take medications as defined in the Anti-coagulation Track section of this encounter.  Patient instructed to take today's dose.  Patient verbalized understanding of these instructions.    

## 2011-06-24 ENCOUNTER — Other Ambulatory Visit: Payer: Self-pay | Admitting: Internal Medicine

## 2011-06-24 ENCOUNTER — Ambulatory Visit (INDEPENDENT_AMBULATORY_CARE_PROVIDER_SITE_OTHER): Payer: PRIVATE HEALTH INSURANCE | Admitting: Pharmacist

## 2011-06-24 ENCOUNTER — Ambulatory Visit (INDEPENDENT_AMBULATORY_CARE_PROVIDER_SITE_OTHER): Payer: PRIVATE HEALTH INSURANCE | Admitting: *Deleted

## 2011-06-24 DIAGNOSIS — Z23 Encounter for immunization: Secondary | ICD-10-CM

## 2011-06-24 DIAGNOSIS — Z7901 Long term (current) use of anticoagulants: Secondary | ICD-10-CM

## 2011-06-24 DIAGNOSIS — I6789 Other cerebrovascular disease: Secondary | ICD-10-CM

## 2011-06-24 LAB — POCT INR: INR: 1.8

## 2011-06-24 MED ORDER — WARFARIN SODIUM 3 MG PO TABS: ORAL_TABLET | ORAL | Status: DC

## 2011-06-24 NOTE — Progress Notes (Signed)
Anti-Coagulation Progress Note  Johnwesley Lederman is a 65 y.o. male who is currently on an anti-coagulation regimen.    RECENT RESULTS: Recent results are below, the most recent result is correlated with a dose of 16.5 mg. per week: Lab Results  Component Value Date   INR 1.80 06/24/2011   INR 2.2 05/27/2011   INR 2.80 04/29/2011    ANTI-COAG DOSE:   Latest dosing instructions   Total Sun Mon Tue Wed Thu Fri Sat   19.5 3 mg 3 mg 3 mg 3 mg 3 mg 3 mg 1.5 mg    (3 mg1) (3 mg1) (3 mg1) (3 mg1) (3 mg1) (3 mg1) (3 mg0.5)         ANTICOAG SUMMARY: Anticoagulation Episode Summary              Current INR goal 2.0-3.0 Next INR check 07/22/2011   INR from last check 1.80! (06/24/2011)     Weekly max dose (mg)  Target end date Indefinite   Indications CEREBROVASCULAR ACCIDENT, ACUTE, Long term current use of anticoagulant   INR check location [facility] Preferred [facility]    Send INR reminders to [facility]   Comments        Provider Role Specialty Phone number   Blanch Media  Internal Medicine [phone]        ANTICOAG TODAY: Anticoagulation Summary as of 06/24/2011              INR goal 2.0-3.0     Selected INR 1.80! (06/24/2011) Next INR check 07/22/2011   Weekly max dose (mg)  Target end date Indefinite   Indications CEREBROVASCULAR ACCIDENT, ACUTE, Long term current use of anticoagulant    Anticoagulation Episode Summary              INR check location [facility]    Send INR reminders to [facility]   Comments        Provider Role Specialty Phone number   Blanch Media  Internal Medicine [phone]        PATIENT INSTRUCTIONS: Patient Instructions  Patient instructed to take medications as defined in the Anti-coagulation Track section of this encounter.  Patient instructed to take today's dose.  Patient verbalized understanding of these instructions.        FOLLOW-UP Return in 4 weeks (on 07/22/2011) for Follow up  INR.  Hulen Luster, III Pharm.D., CACP

## 2011-06-24 NOTE — Patient Instructions (Signed)
Patient instructed to take medications as defined in the Anti-coagulation Track section of this encounter.  Patient instructed to take today's dose.  Patient verbalized understanding of these instructions.    

## 2011-07-22 ENCOUNTER — Ambulatory Visit (INDEPENDENT_AMBULATORY_CARE_PROVIDER_SITE_OTHER): Payer: PRIVATE HEALTH INSURANCE | Admitting: Pharmacist

## 2011-07-22 ENCOUNTER — Telehealth: Payer: Self-pay | Admitting: Pharmacist

## 2011-07-22 DIAGNOSIS — I6789 Other cerebrovascular disease: Secondary | ICD-10-CM

## 2011-07-22 DIAGNOSIS — Z7901 Long term (current) use of anticoagulants: Secondary | ICD-10-CM

## 2011-07-22 MED ORDER — WARFARIN SODIUM 3 MG PO TABS: ORAL_TABLET | ORAL | Status: DC

## 2011-07-22 NOTE — Progress Notes (Signed)
Anti-Coagulation Progress Note  Robert Newton is a 65 y.o. male who is currently on an anti-coagulation regimen.    RECENT RESULTS: Recent results are below, the most recent result is correlated with a dose of 19.5 mg. per week: Lab Results  Component Value Date   INR 2.80 07/22/2011   INR 1.80 06/24/2011   INR 2.2 05/27/2011    ANTI-COAG DOSE:   Latest dosing instructions   Total Sun Mon Tue Wed Thu Fri Sat   18 3 mg 3 mg 1.5 mg 3 mg 3 mg 3 mg 1.5 mg    (3 mg1) (3 mg1) (3 mg0.5) (3 mg1) (3 mg1) (3 mg1) (3 mg0.5)         ANTICOAG SUMMARY: Anticoagulation Episode Summary              Current INR goal 2.0-3.0 Next INR check 08/19/2011   INR from last check 2.80 (07/22/2011)     Weekly max dose (mg)  Target end date Indefinite   Indications CEREBROVASCULAR ACCIDENT, ACUTE, Long term current use of anticoagulant   INR check location [facility] Preferred [facility]    Send INR reminders to [facility]   Comments        Provider Role Specialty Phone number   Blanch Media  Internal Medicine [phone]        ANTICOAG TODAY: Anticoagulation Summary as of 07/22/2011              INR goal 2.0-3.0     Selected INR 2.80 (07/22/2011) Next INR check 08/19/2011   Weekly max dose (mg)  Target end date Indefinite   Indications CEREBROVASCULAR ACCIDENT, ACUTE, Long term current use of anticoagulant    Anticoagulation Episode Summary              INR check location [facility]    Send INR reminders to [facility]   Comments        Provider Role Specialty Phone number   Blanch Media  Internal Medicine [phone]        PATIENT INSTRUCTIONS: Patient Instructions  Patient instructed to take medications as defined in the Anti-coagulation Track section of this encounter.  Patient instructed to take today's dose.  Patient verbalized understanding of these instructions.        FOLLOW-UP Return in 4 weeks (on 08/19/2011) for Follow up  INR.  Hulen Luster, III Pharm.D., CACP

## 2011-07-22 NOTE — Patient Instructions (Signed)
Patient instructed to take medications as defined in the Anti-coagulation Track section of this encounter.  Patient instructed to take today's dose.  Patient verbalized understanding of these instructions.    

## 2011-07-28 ENCOUNTER — Other Ambulatory Visit: Payer: Self-pay | Admitting: Internal Medicine

## 2011-08-19 ENCOUNTER — Ambulatory Visit (INDEPENDENT_AMBULATORY_CARE_PROVIDER_SITE_OTHER): Payer: PRIVATE HEALTH INSURANCE | Admitting: Pharmacist

## 2011-08-19 DIAGNOSIS — Z7901 Long term (current) use of anticoagulants: Secondary | ICD-10-CM

## 2011-08-19 DIAGNOSIS — I6789 Other cerebrovascular disease: Secondary | ICD-10-CM

## 2011-08-19 NOTE — Patient Instructions (Signed)
Patient instructed to take medications as defined in the Anti-coagulation Track section of this encounter.  Patient instructed to take today's dose.  Patient verbalized understanding of these instructions.    

## 2011-08-19 NOTE — Progress Notes (Signed)
Anti-Coagulation Progress Note  Jeffry Vogelsang is a 66 y.o. male who is currently on an anti-coagulation regimen.    RECENT RESULTS: Recent results are below, the most recent result is correlated with a dose of 18 mg. per week: Lab Results  Component Value Date   INR 2.80 08/19/2011   INR 2.80 07/22/2011   INR 1.80 06/24/2011    ANTI-COAG DOSE:   Latest dosing instructions   Total Sun Mon Tue Wed Thu Fri Sat   18 3 mg 3 mg 1.5 mg 3 mg 3 mg 3 mg 1.5 mg    (3 mg1) (3 mg1) (3 mg0.5) (3 mg1) (3 mg1) (3 mg1) (3 mg0.5)         ANTICOAG SUMMARY: Anticoagulation Episode Summary              Current INR goal 2.0-3.0 Next INR check 09/16/2011   INR from last check 2.80 (08/19/2011)     Weekly max dose (mg)  Target end date Indefinite   Indications CEREBROVASCULAR ACCIDENT, ACUTE, Long term current use of anticoagulant   INR check location [facility] Preferred [facility]    Send INR reminders to [facility]   Comments        Provider Role Specialty Phone number   Blanch Media  Internal Medicine [phone]        ANTICOAG TODAY: Anticoagulation Summary as of 08/19/2011              INR goal 2.0-3.0     Selected INR 2.80 (08/19/2011) Next INR check 09/16/2011   Weekly max dose (mg)  Target end date Indefinite   Indications CEREBROVASCULAR ACCIDENT, ACUTE, Long term current use of anticoagulant    Anticoagulation Episode Summary              INR check location [facility]    Send INR reminders to [facility]   Comments        Provider Role Specialty Phone number   Blanch Media  Internal Medicine [phone]        PATIENT INSTRUCTIONS: Patient Instructions  Patient instructed to take medications as defined in the Anti-coagulation Track section of this encounter.  Patient instructed to take today's dose.  Patient verbalized understanding of these instructions.        FOLLOW-UP Return in 4 weeks (on 09/16/2011) for Follow up INR.  Hulen Luster, III Pharm.D., CACP

## 2011-09-16 ENCOUNTER — Ambulatory Visit (INDEPENDENT_AMBULATORY_CARE_PROVIDER_SITE_OTHER): Payer: PRIVATE HEALTH INSURANCE | Admitting: Pharmacist

## 2011-09-16 DIAGNOSIS — I6789 Other cerebrovascular disease: Secondary | ICD-10-CM

## 2011-09-16 DIAGNOSIS — Z7901 Long term (current) use of anticoagulants: Secondary | ICD-10-CM

## 2011-09-16 MED ORDER — WARFARIN SODIUM 3 MG PO TABS: ORAL_TABLET | ORAL | Status: DC

## 2011-09-16 NOTE — Progress Notes (Signed)
Anti-Coagulation Progress Note  Robert Newton is a 66 y.o. male who is currently on an anti-coagulation regimen.    RECENT RESULTS: Recent results are below, the most recent result is correlated with a dose of 18 mg. per week: Lab Results  Component Value Date   INR 2.50 09/16/2011   INR 2.80 08/19/2011   INR 2.80 07/22/2011    ANTI-COAG DOSE:   Latest dosing instructions   Total Sun Mon Tue Wed Thu Fri Sat   18 3 mg 3 mg 1.5 mg 3 mg 3 mg 3 mg 1.5 mg    (3 mg1) (3 mg1) (3 mg0.5) (3 mg1) (3 mg1) (3 mg1) (3 mg0.5)         ANTICOAG SUMMARY: Anticoagulation Episode Summary              Current INR goal 2.0-3.0 Next INR check 10/21/2011   INR from last check 2.50 (09/16/2011)     Weekly max dose (mg)  Target end date Indefinite   Indications CEREBROVASCULAR ACCIDENT, ACUTE, Long term current use of anticoagulant   [facility]R check location [facility]    Send INR reminders to [facility]   Comments        Provider Role Specialty Phone number   Blanch Media, MD  Internal Medicine [phone]        ANTICOAG TODAY: Anticoagulation Summary as of 09/16/2011              INR goal 2.0-3.0     Selected INR 2.50 (09/16/2011) Next INR check 10/21/2011   Weekly max dose (mg)  Target end date Indefinite   Indications CEREBROVASCULAR ACCIDENT, ACUTE, Long term current use of anticoagulant    Anticoagulation Episode Summary              INR check location [facility]    Send INR reminders to [facility]   Comments        Provider Role Specialty Phone number   Blanch Media, MD  Internal Medicine [phone]        PATIENT INSTRUCTIONS: Patient Instructions  Patient instructed to take medications as defined in the Anti-coagulation Track section of this encounter.  Patient instructed to OMIT today's dose (has already TAKEN today's dose).  Will commence tomorrow as per instructions provided.  Patient verbalized understanding of these  instructions.        FOLLOW-UP Return in 5 weeks (on 10/21/2011) for Follow up INR.  Hulen Luster, III Pharm.D., CACP

## 2011-09-16 NOTE — Patient Instructions (Signed)
Patient instructed to take medications as defined in the Anti-coagulation Track section of this encounter.  Patient instructed to OMIT today's dose (has already TAKEN today's dose).  Will commence tomorrow as per instructions provided.  Patient verbalized understanding of these instructions.

## 2011-09-26 ENCOUNTER — Other Ambulatory Visit: Payer: Self-pay | Admitting: Internal Medicine

## 2011-10-15 ENCOUNTER — Encounter: Payer: Self-pay | Admitting: Vascular Surgery

## 2011-10-16 ENCOUNTER — Other Ambulatory Visit (INDEPENDENT_AMBULATORY_CARE_PROVIDER_SITE_OTHER): Payer: PRIVATE HEALTH INSURANCE | Admitting: *Deleted

## 2011-10-16 ENCOUNTER — Ambulatory Visit (INDEPENDENT_AMBULATORY_CARE_PROVIDER_SITE_OTHER): Payer: PRIVATE HEALTH INSURANCE | Admitting: Vascular Surgery

## 2011-10-16 ENCOUNTER — Encounter: Payer: Self-pay | Admitting: Vascular Surgery

## 2011-10-16 VITALS — BP 156/86 | HR 73 | Resp 16 | Ht 73.0 in | Wt 168.0 lb

## 2011-10-16 DIAGNOSIS — I6529 Occlusion and stenosis of unspecified carotid artery: Secondary | ICD-10-CM

## 2011-10-16 NOTE — Progress Notes (Signed)
[facility] Specialist of [facility]  Patient name: Robert Newton MRN: [id number] DOB: Aug 08, 1946 Sex: male  REASON FOR VISIT: follow up of carotid disease  HPI: Robert Newton is a 66 y.o. male who is had a known greater than 80% right internal carotid artery stenosis. He also has significant distal intracranial disease and therefore it is felt that he is not a candidate for carotid endarterectomy. He's had a previous right hemispheric stroke associated with left-sided weakness. Given the issue on the right were been following his left side closely although his had no significant disease on the left. He comes in for a yearly follow up visit.  Since I saw him last a year ago, he said no history of new onset stroke, TIAs, expressive or receptive aphasia, or amaurosis fugax. His been no significant change in his medical history. He does continue to smoke.  Past Medical History  Diagnosis Date  . Hyperlipidemia   . Hypertension   . Low back pain   . Tobacco abuse   . 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 Carotid artery stenosis     right  . Asthenia   . Erectile dysfunction   . Hypokalemia   . PUD (peptic ulcer disease)     hx of  . Impetigo     hx of  . Hypokalemia   . Abnormal CT of the abdomen     abnormal lymph nodes  . Abdominal pain     RLQ    Family History  Problem Relation Age of Onset  . Hypertension Mother     SOCIAL HISTORY: History  Substance Use Topics  . Smoking status: Current Everyday Smoker -- 0.5 packs/day    Types: Cigarettes  . Smokeless tobacco: Not on file  . Alcohol Use: No    No Known Allergies  Current Outpatient Prescriptions  Medication Sig Dispense Refill  . amLODipine (NORVASC) 2.5 MG tablet TAKE 1 TABLET BY MOUTH EVERY DAY  30 tablet  11  . atorvastatin (LIPITOR) 40 MG tablet TAKE 1 TABLET DAILY AT NIGHT  30 tablet  11  . docusate sodium (COLACE) 100 MG capsule Take 100 mg by mouth daily.        Marland Kitchen omeprazole (PRILOSEC) 40 MG capsule  TAKE ONE CAPSULE AT BEDTIME  30 capsule  3  . warfarin (COUMADIN) 3 MG tablet Take as directed by anticoagulation clinic provider.  31 tablet  2  . aspirin 81 MG EC tablet Take 81 mg by mouth daily.        Marland Kitchen azithromycin (ZITHROMAX) 1 G powder Take 1 packet by mouth once.        . ENSURE (ENSURE) Take 237 mLs by mouth 3 (three) times daily with meals. Vanilla flavor         REVIEW OF SYSTEMS: Robert Newton ] denotes positive finding; [  ] denotes negative finding  CARDIOVASCULAR:  [ ]  chest pain   [ ]  chest pressure   [ ]  palpitations   [ ]  orthopnea   [ ]  dyspnea on exertion   Robert Newton ] claudication   [ ]  rest pain   [ ]  DVT   [ ]  phlebitis PULMONARY:   [ ]  productive cough   [ ]  asthma   [ ]  wheezing NEUROLOGIC:   Robert Newton ] weakness- left side chronic  [X]  paresthesias- left side, chronic  [ ]  aphasia  [ ]  amaurosis  Robert Newton ] dizziness HEMATOLOGIC:   [ ]  bleeding problems   [ ]   clotting disorders MUSCULOSKELETAL:  [ ]  joint pain   [ ]  joint swelling [ ]  leg swelling GASTROINTESTINAL: [ ]   blood in stool  [ ]   hematemesis GENITOURINARY:  [ ]   dysuria  [ ]   hematuria PSYCHIATRIC:  [ ]  history of major depression INTEGUMENTARY:  [ ]  rashes  [ ]  ulcers CONSTITUTIONAL:  [ ]  fever   [ ]  chills  PHYSICAL EXAM: Filed Vitals:   10/16/11 1029 10/16/11 1031  BP: 149/85 156/86  Pulse: 76 73  Resp: 16   Height: 6\' 1"  (1.854 m)   Weight: 168 lb (76.204 kg)   SpO2: 100% 100%   Body mass index is 22.16 kg/(m^2). GENERAL: The patient is a well-nourished male, in no acute distress. The vital signs are documented above. CARDIOVASCULAR: There is a regular rate and rhythm without significant murmur appreciated. I do not detect carotid bruits. He has no significant lower extremity swelling PULMONARY: There is good air exchange bilaterally without wheezing or rales. ABDOMEN: Soft and non-tender with normal pitched bowel sounds.  MUSCULOSKELETAL: There are no major deformities or cyanosis. NEUROLOGIC: He has left-sided  weakness which is stable. SKIN: There are no ulcers or rashes noted. PSYCHIATRIC: The patient has a normal affect.  DATA:  I have independently interpreted his carotid duplex scan which shows an 89 9% right internal carotid artery stenosis with no significant stenosis on the left. Of note, the velocities on the right have improved slightly compared to the study a year ago. Both vertebral arteries are patent with normally directed flow. Arm pressures are equal.  MEDICAL ISSUES: This patient has an inoperable right carotid stenosis with significant distal internal carotid artery disease. His had no new neurologic symptoms. Or following his left side closely. He's had no significant disease on the left. I've ordered a fall carotid duplex scan in 1 year and I'll see him back at that time. He knows to call sooner if he has problems. We'll also discussed the importance of tobacco cessation.  Robert Newton S [facility] [place]: [phone]

## 2011-10-21 ENCOUNTER — Ambulatory Visit (INDEPENDENT_AMBULATORY_CARE_PROVIDER_SITE_OTHER): Payer: PRIVATE HEALTH INSURANCE | Admitting: Pharmacist

## 2011-10-21 DIAGNOSIS — I6789 Other cerebrovascular disease: Secondary | ICD-10-CM

## 2011-10-21 DIAGNOSIS — Z7901 Long term (current) use of anticoagulants: Secondary | ICD-10-CM

## 2011-10-21 NOTE — Patient Instructions (Signed)
Patient instructed to take medications as defined in the Anti-coagulation Track section of this encounter.  Patient instructed to take today's dose.  Patient verbalized understanding of these instructions.    

## 2011-10-21 NOTE — Progress Notes (Signed)
Agree with Dr. Groce's assessment and management plan. 

## 2011-10-21 NOTE — Progress Notes (Signed)
Anti-Coagulation Progress Note  Robert Newton is a 66 y.o. male who is currently on an anti-coagulation regimen.    RECENT RESULTS: Recent results are below, the most recent result is correlated with a dose of 18 mg. per week: Lab Results  Component Value Date   INR 2.40 10/21/2011   INR 2.50 09/16/2011   INR 2.80 08/19/2011    ANTI-COAG DOSE:   Latest dosing instructions   Total Sun Mon Tue Wed Thu Fri Sat   18 3 mg 3 mg 1.5 mg 3 mg 3 mg 3 mg 1.5 mg    (3 mg1) (3 mg1) (3 mg0.5) (3 mg1) (3 mg1) (3 mg1) (3 mg0.5)         ANTICOAG SUMMARY: Anticoagulation Episode Summary              Current INR goal 2.0-3.0 Next INR check 11/18/2011   INR from last check 2.40 (10/21/2011)     Weekly max dose (mg)  Target end date Indefinite   Indications CEREBROVASCULAR ACCIDENT, ACUTE, Long term current use of anticoagulant   [facility] check location [facility]    Send INR reminders to ANTICOAG IMP   Comments        Provider Role Specialty Phone number   Burns Spain, MD  Internal Medicine [phone]        ANTICOAG TODAY: Anticoagulation Summary as of 10/21/2011              INR goal 2.0-3.0     Selected INR 2.40 (10/21/2011) Next INR check 11/18/2011   Weekly max dose (mg)  Target end date Indefinite   Indications CEREBROVASCULAR ACCIDENT, ACUTE, Long term current use of anticoagulant    Anticoagulation Episode Summary              INR check location [facility] Preferred lab    Send INR reminders to ANTICOAG IMP   Comments        Provider Role Specialty Phone number   Burns Spain, MD  Internal Medicine [phone]        PATIENT INSTRUCTIONS: Patient Instructions  Patient instructed to take medications as defined in the Anti-coagulation Track section of this encounter.  Patient instructed to take today's dose.  Patient verbalized understanding of these instructions.        FOLLOW-UP Return in 4 weeks (on 11/18/2011) for Follow up  INR.  Hulen Luster, III Pharm.D., CACP

## 2011-10-30 NOTE — Procedures (Unsigned)
CAROTID DUPLEX EXAM  INDICATION:  Carotid disease  HISTORY: Diabetes:  No Cardiac:  No Hypertension:  No Smoking:  Yes Previous Surgery:  No CV History:  Complaint of dizziness Amaurosis Fugax No, Paresthesias No, Hemiparesis No                                      RIGHT             LEFT Brachial systolic pressure:         140               138 Brachial Doppler waveforms:         Normal            Normal Vertebral direction of flow:        Antegrade         Antegrade DUPLEX VELOCITIES (cm/sec) CCA peak systolic                   44                79 ECA peak systolic                   86                124 ICA peak systolic                   313               80 ICA end diastolic                   128               35 PLAQUE MORPHOLOGY:                  Calcific          Mixed PLAQUE AMOUNT:                      Severe            Mild PLAQUE LOCATION:                    ICA / ECA         ICA / ECA  IMPRESSION: 1. Doppler velocities suggest 80%-99% stenosis at the right proximal     internal carotid artery and no hemodynamically significant stenosis     at the left internal carotid artery. 2. No significant change in Doppler velocities when compared to the     previous exam on 09/12/2010.  ___________________________________________ Di Kindle. Edilia Bo, M.D.  CH/MEDQ  D:  10/18/2011  T:  10/18/2011  Job:  [id number]

## 2011-11-17 ENCOUNTER — Other Ambulatory Visit: Payer: Self-pay | Admitting: Internal Medicine

## 2011-11-18 ENCOUNTER — Ambulatory Visit (INDEPENDENT_AMBULATORY_CARE_PROVIDER_SITE_OTHER): Payer: PRIVATE HEALTH INSURANCE | Admitting: Pharmacist

## 2011-11-18 DIAGNOSIS — I6789 Other cerebrovascular disease: Secondary | ICD-10-CM

## 2011-11-18 DIAGNOSIS — Z7901 Long term (current) use of anticoagulants: Secondary | ICD-10-CM

## 2011-11-18 MED ORDER — WARFARIN SODIUM 3 MG PO TABS: ORAL_TABLET | ORAL | Status: DC

## 2011-11-18 NOTE — Patient Instructions (Signed)
Patient instructed to take medications as defined in the Anti-coagulation Track section of this encounter.  Patient instructed to take today's dose.  Patient verbalized understanding of these instructions.    

## 2011-11-18 NOTE — Progress Notes (Signed)
Anti-Coagulation Progress Note  Silus Lanzo is a 66 y.o. male who is currently on an anti-coagulation regimen.    RECENT RESULTS: Recent results are below, the most recent result is correlated with a dose of 18 mg. per week: Lab Results  Component Value Date   INR 2.20 11/18/2011   INR 2.40 10/21/2011   INR 2.50 09/16/2011    ANTI-COAG DOSE:   Latest dosing instructions   Total Sun Mon Tue Wed Thu Fri Sat   19.5 3 mg 3 mg 3 mg 3 mg 3 mg 3 mg 1.5 mg    (3 mg1) (3 mg1) (3 mg1) (3 mg1) (3 mg1) (3 mg1) (3 mg0.5)         ANTICOAG SUMMARY: Anticoagulation Episode Summary              Current INR goal 2.0-3.0 Next INR check 12/16/2011   INR from last check 2.20 (11/18/2011)     Weekly max dose (mg)  Target end date Indefinite   Indications CEREBROVASCULAR ACCIDENT, ACUTE, Long term current use of anticoagulant   [facility] check location [facility]    Send INR reminders to ANTICOAG IMP   Comments        Provider Role Specialty Phone number   Burns Spain, MD  Internal Medicine [phone]        ANTICOAG TODAY: Anticoagulation Summary as of 11/18/2011              INR goal 2.0-3.0     Selected INR 2.20 (11/18/2011) Next INR check 12/16/2011   Weekly max dose (mg)  Target end date Indefinite   Indications CEREBROVASCULAR ACCIDENT, ACUTE, Long term current use of anticoagulant    Anticoagulation Episode Summary              INR check location [facility] Preferred lab    Send INR reminders to ANTICOAG IMP   Comments        Provider Role Specialty Phone number   Burns Spain, MD  Internal Medicine [phone]        PATIENT INSTRUCTIONS: Patient Instructions  Patient instructed to take medications as defined in the Anti-coagulation Track section of this encounter.  Patient instructed to take today's dose.  Patient verbalized understanding of these instructions.      FOLLOW-UP Return in 4 weeks (on 12/16/2011) for Follow up  INR.  Hulen Luster, III Pharm.D., CACP

## 2011-12-01 IMAGING — CT CT HEAD W/O CM
1 of 2 series · 16 of 30 positions shown, 20 images · non-contrast
Comparison: None.

CLINICAL DATA: Dizziness, hypertension, history of prior stroke

CT HEAD WITHOUT CONTRAST
TECHNIQUE: Contiguous axial images were obtained from the base of
the skull through the vertex without contrast.

[Series 2: head routine 4.8 h37s · axial · 0.52mm/px · z∈[-103,+57]mm · 16 of 36 slices shown, 20 images]
[im 2/36  brain]
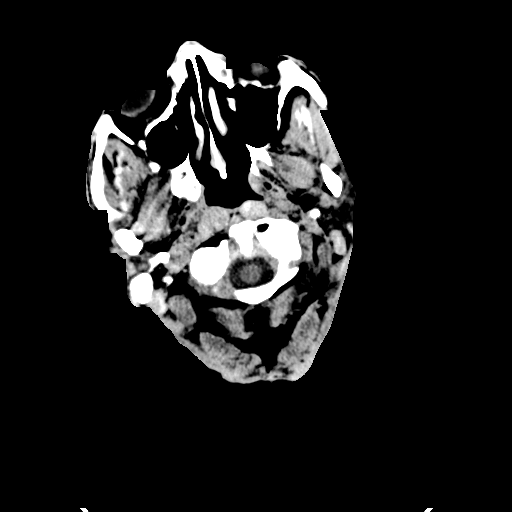
[im 2/36  bone]
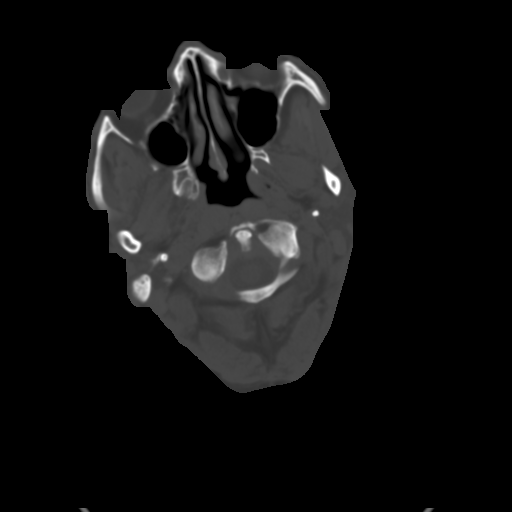
[im 4/36  brain]
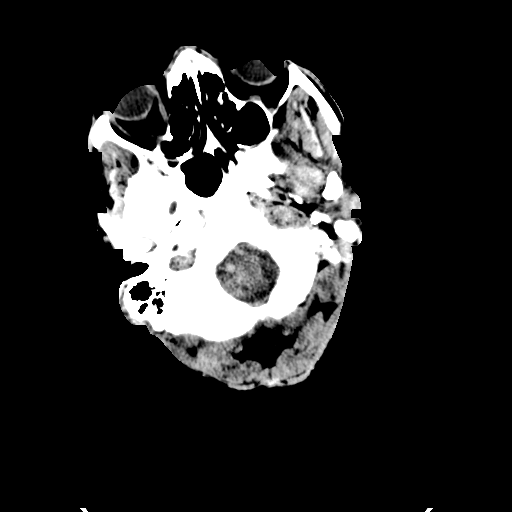
[im 7/36  brain]
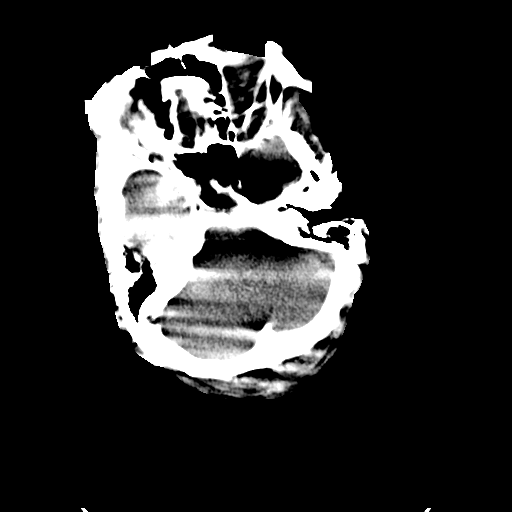
[im 9/36  brain]
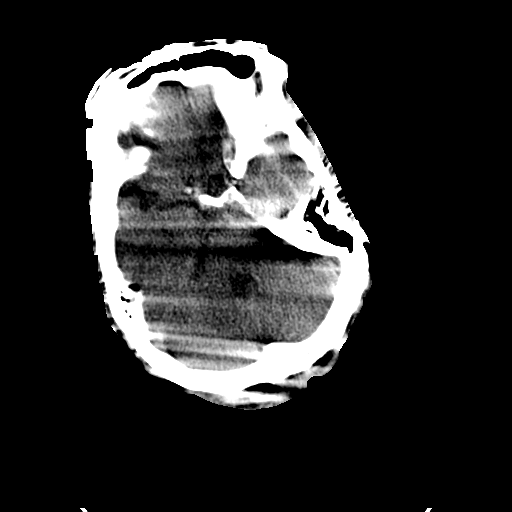
[im 11/36  brain]
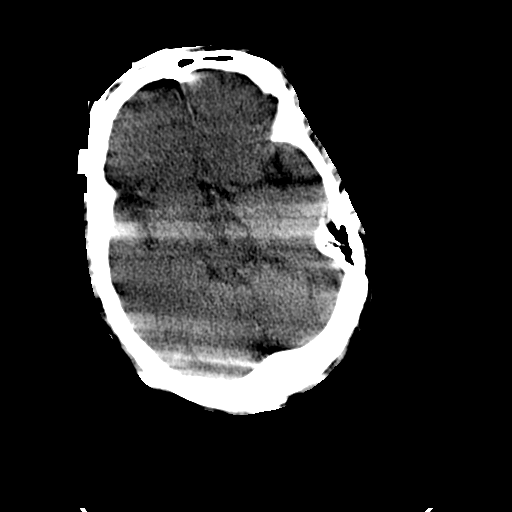
[im 11/36  bone]
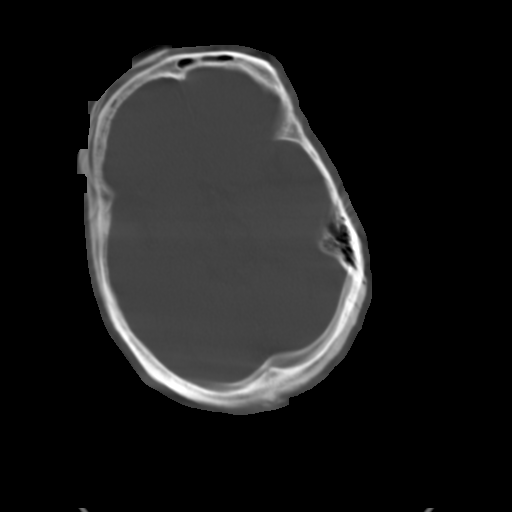
[im 12/36  brain]
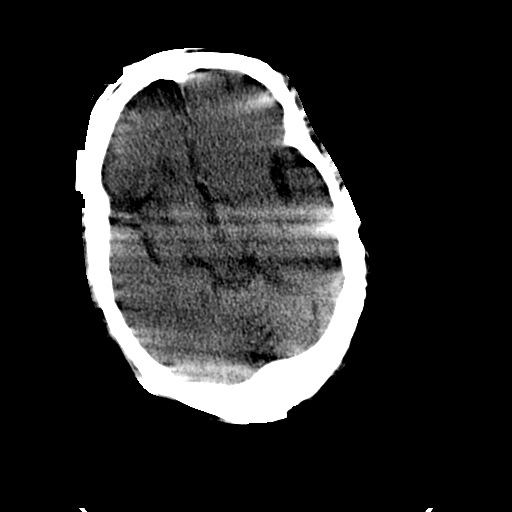
[im 16/36  brain]
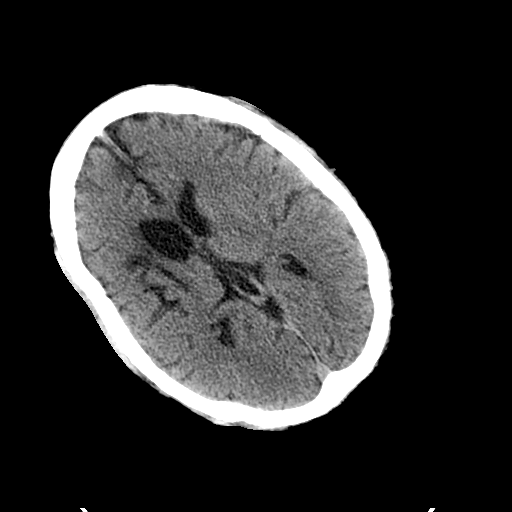
[im 17/36  brain]
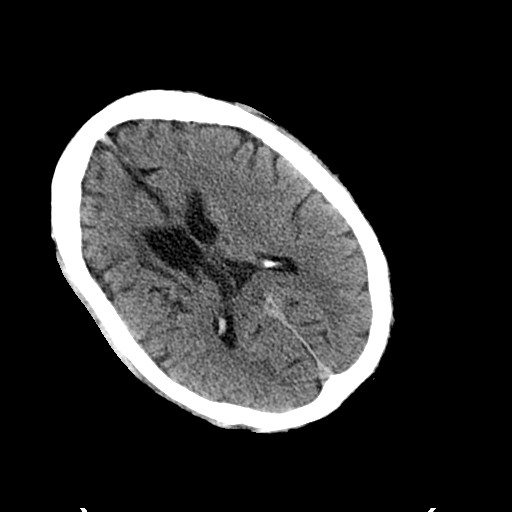
[im 19/36  brain]
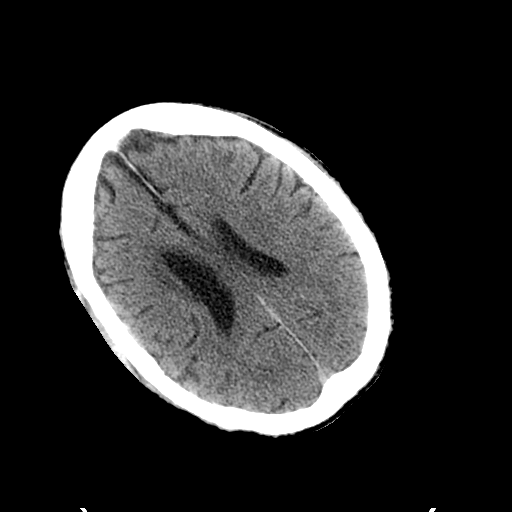
[im 19/36  bone]
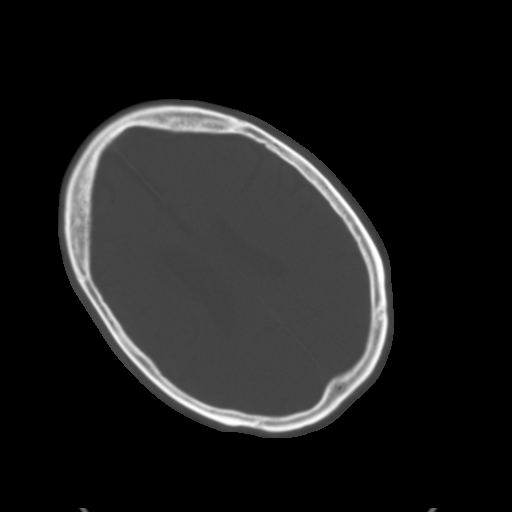
[im 21/36  brain]
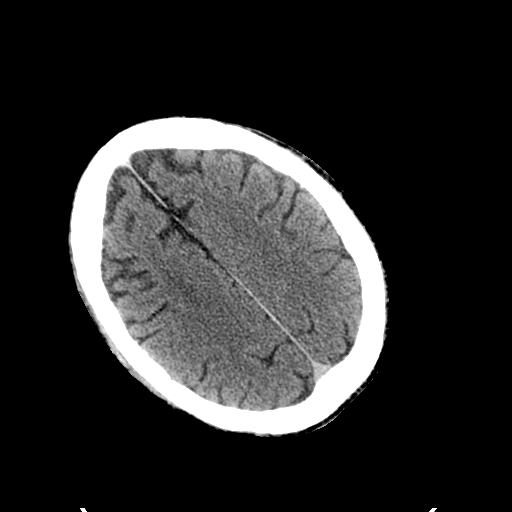
[im 24/36  brain]
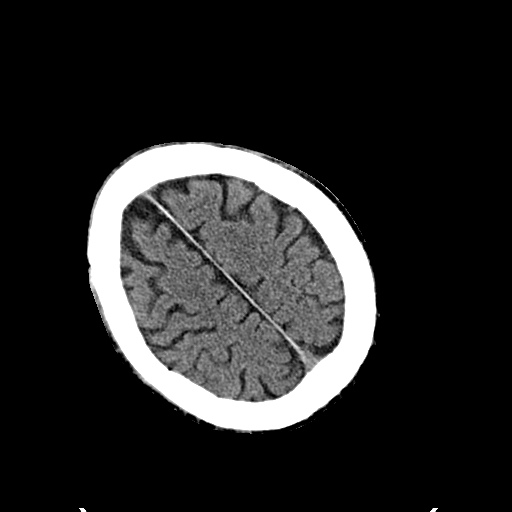
[im 26/36  brain]
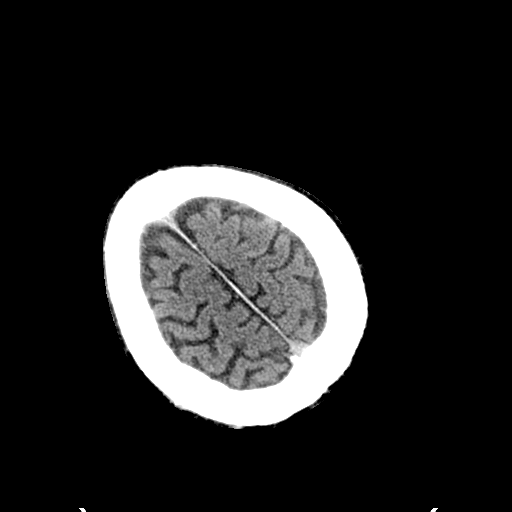
[im 27/36  brain]
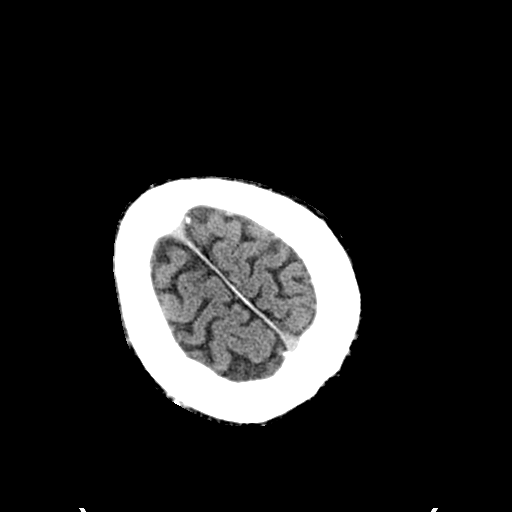
[im 27/36  bone]
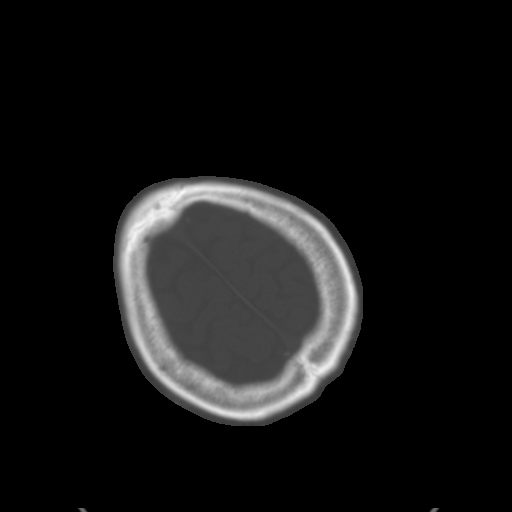
[im 29/36  brain]
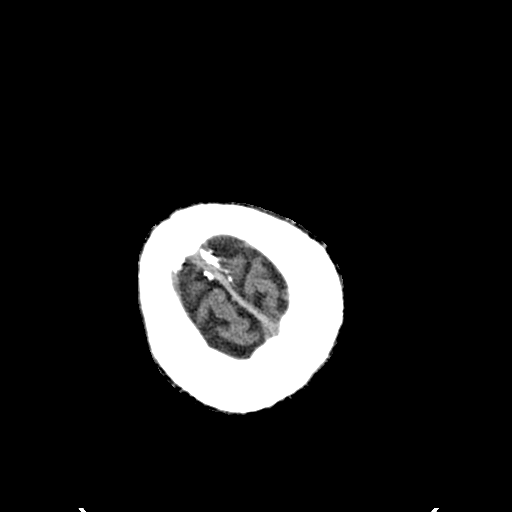
[im 32/36  brain]
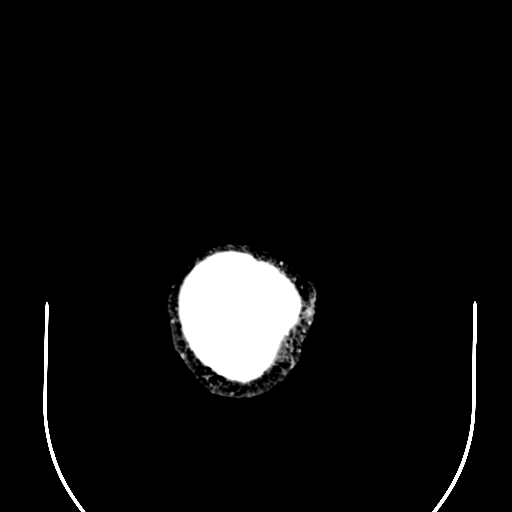
[im 34/36  brain]
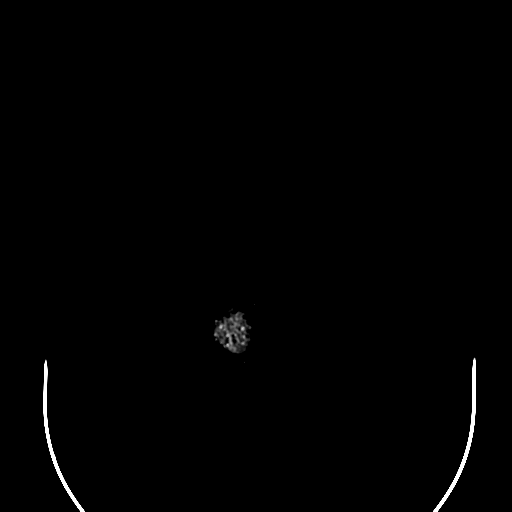

[16 of 30 positions shown; findings below may reference images not displayed]

FINDINGS: The ventricular system is normal in size.  There is some
dilatation of the anterior horn of he right lateral ventricle due
to encephalomalacia from adjacent right basal ganglial infarct
which appears old.  The septum is midline in position.  No
hemorrhage, mass lesion, or acute infarction is seen.  No acute
calvarial abnormality is noted.  This study is slightly degraded by
patient motion.
IMPRESSION: No acute abnormality.  Old right basal ganglial infarct.

## 2011-12-16 ENCOUNTER — Ambulatory Visit (INDEPENDENT_AMBULATORY_CARE_PROVIDER_SITE_OTHER): Payer: PRIVATE HEALTH INSURANCE | Admitting: Pharmacist

## 2011-12-16 DIAGNOSIS — I6789 Other cerebrovascular disease: Secondary | ICD-10-CM

## 2011-12-16 DIAGNOSIS — Z7901 Long term (current) use of anticoagulants: Secondary | ICD-10-CM

## 2011-12-16 MED ORDER — WARFARIN SODIUM 3 MG PO TABS: ORAL_TABLET | ORAL | Status: DC

## 2011-12-16 NOTE — Patient Instructions (Signed)
Patient instructed to take medications as defined in the Anti-coagulation Track section of this encounter.  Patient instructed to takd today's dose.  Patient verbalized understanding of these instructions.     

## 2011-12-16 NOTE — Progress Notes (Signed)
Anti-Coagulation Progress Note  Numan Zylstra is a 66 y.o. male who is currently on an anti-coagulation regimen.    RECENT RESULTS: Recent results are below, the most recent result is correlated with a dose of 19.5 mg. per week: Lab Results  Component Value Date   INR 2.40 12/16/2011   INR 2.20 11/18/2011   INR 2.40 10/21/2011    ANTI-COAG DOSE:   Latest dosing instructions   Total Sun Mon Tue Wed Thu Fri Sat   19.5 3 mg 3 mg 3 mg 3 mg 3 mg 3 mg 1.5 mg    (3 mg1) (3 mg1) (3 mg1) (3 mg1) (3 mg1) (3 mg1) (3 mg0.5)         ANTICOAG SUMMARY: Anticoagulation Episode Summary              Current INR goal 2.0-3.0 Next INR check 01/13/2012   INR from last check 2.40 (12/16/2011)     Weekly max dose (mg)  Target end date Indefinite   Indications CEREBROVASCULAR ACCIDENT, ACUTE, Long term current use of anticoagulant   [facility] check location [facility]    Send INR reminders to ANTICOAG IMP   Comments        Provider Role Specialty Phone number   Burns Spain, MD  Internal Medicine [phone]        ANTICOAG TODAY: Anticoagulation Summary as of 12/16/2011              INR goal 2.0-3.0     Selected INR 2.40 (12/16/2011) Next INR check 01/13/2012   Weekly max dose (mg)  Target end date Indefinite   Indications CEREBROVASCULAR ACCIDENT, ACUTE, Long term current use of anticoagulant    Anticoagulation Episode Summary              INR check location [facility] Preferred [facility]    Send INR reminders to ANTICOAG IMP   Comments        Provider Role Specialty Phone number   Burns Spain, MD  Internal Medicine [phone]        PATIENT INSTRUCTIONS: Patient Instructions  Patient instructed to take medications as defined in the Anti-coagulation Track section of this encounter.  Patient instructed to takd today's dose.  Patient verbalized understanding of these instructions.        FOLLOW-UP Return in 4 weeks (on 01/13/2012) for Follow up  INR.  Hulen Luster, III Pharm.D., CACP

## 2012-01-13 ENCOUNTER — Ambulatory Visit: Payer: PRIVATE HEALTH INSURANCE

## 2012-01-13 ENCOUNTER — Ambulatory Visit (INDEPENDENT_AMBULATORY_CARE_PROVIDER_SITE_OTHER): Payer: PRIVATE HEALTH INSURANCE | Admitting: Pharmacist

## 2012-01-13 DIAGNOSIS — Z7901 Long term (current) use of anticoagulants: Secondary | ICD-10-CM

## 2012-01-13 DIAGNOSIS — I6789 Other cerebrovascular disease: Secondary | ICD-10-CM

## 2012-01-13 NOTE — Patient Instructions (Signed)
Patient instructed to take medications as defined in the Anti-coagulation Track section of this encounter.  Patient instructed to take today's dose.  Patient verbalized understanding of these instructions.    

## 2012-01-13 NOTE — Progress Notes (Signed)
Anti-Coagulation Progress Note  Jeral Zick is a 66 y.o. male who is currently on an anti-coagulation regimen.    RECENT RESULTS: Recent results are below, the most recent result is correlated with a dose of 19.5 mg. per week: Lab Results  Component Value Date   INR 2.20 01/13/2012   INR 2.40 12/16/2011   INR 2.20 11/18/2011    ANTI-COAG DOSE:   Latest dosing instructions   Total Sun Mon Tue Wed Thu Fri Sat   19.5 3 mg 3 mg 3 mg 3 mg 3 mg 3 mg 1.5 mg    (3 mg1) (3 mg1) (3 mg1) (3 mg1) (3 mg1) (3 mg1) (3 mg0.5)         ANTICOAG SUMMARY: Anticoagulation Episode Summary              Current INR goal 2.0-3.0 Next INR check 02/10/2012   INR from last check 2.20 (01/13/2012)     Weekly max dose (mg)  Target end date Indefinite   Indications CEREBROVASCULAR ACCIDENT, ACUTE, Long term current use of anticoagulant   INR check location [facility]    Send INR reminders to ANTICOAG IMP   Comments        Provider Role Specialty Phone number   Burns Spain, MD  Internal Medicine [phone]        ANTICOAG TODAY: Anticoagulation Summary as of 01/13/2012              INR goal 2.0-3.0     Selected INR 2.20 (01/13/2012) Next INR check 02/10/2012   Weekly max dose (mg)  Target end date Indefinite   Indications CEREBROVASCULAR ACCIDENT, ACUTE, Long term current use of anticoagulant    Anticoagulation Episode Summary              INR check location [facility]    Send INR reminders to ANTICOAG IMP   Comments        Provider Role Specialty Phone number   Burns Spain, MD  Internal Medicine [phone]        PATIENT INSTRUCTIONS: Patient Instructions  Patient instructed to take medications as defined in the Anti-coagulation Track section of this encounter.  Patient instructed to take today's dose.  Patient verbalized understanding of these instructions.        FOLLOW-UP Return in 4 weeks (on 02/10/2012) for Follow up INR at  0845h.  Hulen Luster, III Pharm.D., CACP

## 2012-01-29 ENCOUNTER — Encounter: Payer: Self-pay | Admitting: Internal Medicine

## 2012-01-29 ENCOUNTER — Ambulatory Visit (INDEPENDENT_AMBULATORY_CARE_PROVIDER_SITE_OTHER): Payer: PRIVATE HEALTH INSURANCE | Admitting: Internal Medicine

## 2012-01-29 VITALS — BP 136/78 | HR 76 | Temp 97.2°F | Ht 73.0 in | Wt 167.9 lb

## 2012-01-29 DIAGNOSIS — Z720 Tobacco use: Secondary | ICD-10-CM | POA: Insufficient documentation

## 2012-01-29 DIAGNOSIS — I639 Cerebral infarction, unspecified: Secondary | ICD-10-CM

## 2012-01-29 DIAGNOSIS — I6350 Cerebral infarction due to unspecified occlusion or stenosis of unspecified cerebral artery: Secondary | ICD-10-CM

## 2012-01-29 DIAGNOSIS — I10 Essential (primary) hypertension: Secondary | ICD-10-CM

## 2012-01-29 DIAGNOSIS — F17200 Nicotine dependence, unspecified, uncomplicated: Secondary | ICD-10-CM

## 2012-01-29 DIAGNOSIS — E785 Hyperlipidemia, unspecified: Secondary | ICD-10-CM

## 2012-01-29 DIAGNOSIS — I6789 Other cerebrovascular disease: Secondary | ICD-10-CM

## 2012-01-29 LAB — COMPLETE METABOLIC PANEL WITH GFR
ALT: 13 U/L (ref 0–53)
AST: 15 U/L (ref 0–37)
Alkaline Phosphatase: 83 U/L (ref 39–117)
BUN: 14 mg/dL (ref 6–23)
CO2: 29 mEq/L (ref 19–32)
Chloride: 105 mEq/L (ref 96–112)
Creat: 0.85 mg/dL (ref 0.50–1.35)
GFR, Est Non African American: >89 mL/min
Total Protein: 7 g/dL (ref 6.0–8.3)

## 2012-01-29 LAB — LIPID PANEL
HDL: 36 mg/dL — ABNORMAL LOW (ref >39)
LDL Cholesterol: 72 mg/dL (ref 0–99)
Total CHOL/HDL Ratio: 3.2 Ratio
Triglycerides: 37 mg/dL (ref <150)

## 2012-01-29 NOTE — Assessment & Plan Note (Signed)
Stressed the importance of aspirin for secondary prevention of stroke. Will check lipid profile today and continue Lipitor at this time. Continue warfarin for prevention of stroke. Smoking cessation counseling with Child psychotherapist.

## 2012-01-29 NOTE — Patient Instructions (Signed)
Start taking aspirin.  Smoking Cessation This document explains the best ways for you to quit smoking and new treatments to help. It lists new medicines that can double or triple your chances of quitting and quitting for good. It also considers ways to avoid relapses and concerns you may have about quitting, including weight gain. NICOTINE: A POWERFUL ADDICTION If you have tried to quit smoking, you know how hard it can be. It is hard because nicotine is a very addictive drug. For some people, it can be as addictive as heroin or cocaine. Usually, people make 2 or 3 tries, or more, before finally being able to quit. Each time you try to quit, you can learn about what helps and what hurts. Quitting takes hard work and a lot of effort, but you can quit smoking. QUITTING SMOKING IS ONE OF THE MOST IMPORTANT THINGS YOU WILL EVER DO.  You will live longer, feel better, and live better.   The impact on your body of quitting smoking is felt almost immediately:   Within 20 minutes, blood pressure decreases. Pulse returns to its normal level.   After 8 hours, carbon monoxide levels in the blood return to normal. Oxygen level increases.   After 24 hours, chance of heart attack starts to decrease. Breath, hair, and body stop smelling like smoke.   After 48 hours, damaged nerve endings begin to recover. Sense of taste and smell improve.   After 72 hours, the body is virtually free of nicotine. Bronchial tubes relax and breathing becomes easier.   After 2 to 12 weeks, lungs can hold more air. Exercise becomes easier and circulation improves.   Quitting will reduce your risk of having a heart attack, stroke, cancer, or lung disease:   After 1 year, the risk of coronary heart disease is cut in half.   After 5 years, the risk of stroke falls to the same as a nonsmoker.   After 10 years, the risk of lung cancer is cut in half and the risk of other cancers decreases significantly.   After 15 years, the  risk of coronary heart disease drops, usually to the level of a nonsmoker.   If you are pregnant, quitting smoking will improve your chances of having a healthy baby.   The people you live with, especially your children, will be healthier.   You will have extra money to spend on things other than cigarettes.  FIVE KEYS TO QUITTING Studies have shown that these 5 steps will help you quit smoking and quit for good. You have the best chances of quitting if you use them together: 1. Get ready.  2. Get support and encouragement.  3. Learn new skills and behaviors.  4. Get medicine to reduce your nicotine addiction and use it correctly.  5. Be prepared for relapse or difficult situations. Be determined to continue trying to quit, even if you do not succeed at first.  1. GET READY  Set a quit date.   Change your environment.   Get rid of ALL cigarettes, ashtrays, matches, and lighters in your home, car, and place of work.   Do not let people smoke in your home.   Review your past attempts to quit. Think about what worked and what did not.   Once you quit, do not smoke. NOT EVEN A PUFF!  2. GET SUPPORT AND ENCOURAGEMENT Studies have shown that you have a better chance of being successful if you have help. You can get support in  many ways.  Tell your family, friends, and coworkers that you are going to quit and need their support. Ask them not to smoke around you.   Talk to your caregivers (doctor, dentist, nurse, pharmacist, psychologist, and/or smoking counselor).   Get individual, group, or telephone counseling and support. The more counseling you have, the better your chances are of quitting. Programs are available at [employer] and health centers. Call your local health department for information about programs in your area.   Spiritual beliefs and practices may help some smokers quit.   Quit meters are Photographer that keep track of quit  statistics, such as amount of "quit-time," cigarettes not smoked, and money saved.   Many smokers find one or more of the many self-help books available useful in helping them quit and stay off tobacco.  3. LEARN NEW SKILLS AND BEHAVIORS  Try to distract yourself from urges to smoke. Talk to someone, go for a walk, or occupy your time with a task.   When you first try to quit, change your routine. Take a different route to work. Drink tea instead of coffee. Eat breakfast in a different place.   Do something to reduce your stress. Take a hot bath, exercise, or read a book.   Plan something enjoyable to do every day. Reward yourself for not smoking.   Explore interactive web-based programs that specialize in helping you quit.  4. GET MEDICINE AND USE IT CORRECTLY Medicines can help you stop smoking and decrease the urge to smoke. Combining medicine with the above behavioral methods and support can quadruple your chances of successfully quitting smoking. The [place] Food and Drug Administration (FDA) has approved 7 medicines to help you quit smoking. These medicines fall into 3 categories.  Nicotine replacement therapy (delivers nicotine to your body without the negative effects and risks of smoking):   Nicotine gum: Available over-the-counter.   Nicotine lozenges: Available over-the-counter.   Nicotine inhaler: Available by prescription.   Nicotine nasal spray: Available by prescription.   Nicotine skin patches (transdermal): Available by prescription and over-the-counter.   Antidepressant medicine (helps people abstain from smoking, but how this works is unknown):   Bupropion sustained-release (SR) tablets: Available by prescription.   Nicotinic receptor partial agonist (simulates the effect of nicotine in your brain):   Varenicline tartrate tablets: Available by prescription.   Ask your caregiver for advice about which medicines to use and how to use them. Carefully read the  information on the package.   Everyone who is trying to quit may benefit from using a medicine. If you are pregnant or trying to become pregnant, nursing an infant, you are under age 44, or you smoke fewer than 10 cigarettes per day, talk to your caregiver before taking any nicotine replacement medicines.   You should stop using a nicotine replacement product and call your caregiver if you experience nausea, dizziness, weakness, vomiting, fast or irregular heartbeat, mouth problems with the lozenge or gum, or redness or swelling of the skin around the patch that does not go away.   Do not use any other product containing nicotine while using a nicotine replacement product.   Talk to your caregiver before using these products if you have diabetes, heart disease, asthma, stomach ulcers, you had a recent heart attack, you have high blood pressure that is not controlled with medicine, a history of irregular heartbeat, or you have been prescribed medicine to help you quit smoking.  5. BE  PREPARED FOR RELAPSE OR DIFFICULT SITUATIONS  Most relapses occur within the first 3 months after quitting. Do not be discouraged if you start smoking again. Remember, most people try several times before they finally quit.   You may have symptoms of withdrawal because your body is used to nicotine. You may crave cigarettes, be irritable, feel very hungry, cough often, get headaches, or have difficulty concentrating.   The withdrawal symptoms are only temporary. They are strongest when you first quit, but they will go away within 10 to 14 days.  Here are some difficult situations to watch for:  Alcohol. Avoid drinking alcohol. Drinking lowers your chances of successfully quitting.   Caffeine. Try to reduce the amount of caffeine you consume. It also lowers your chances of successfully quitting.   Other smokers. Being around smoking can make you want to smoke. Avoid smokers.   Weight gain. Many smokers will gain  weight when they quit, usually less than 10 pounds. Eat a healthy diet and stay active. Do not let weight gain distract you from your main goal, quitting smoking. Some medicines that help you quit smoking may also help delay weight gain. You can always lose the weight gained after you quit.   Bad mood or depression. There are a lot of ways to improve your mood other than smoking.  If you are having problems with any of these situations, talk to your caregiver. SPECIAL SITUATIONS AND CONDITIONS Studies suggest that everyone can quit smoking. Your situation or condition can give you a special reason to quit.  Pregnant women/new mothers: By quitting, you protect your baby's health and your own.   Hospitalized patients: By quitting, you reduce health problems and help healing.   Heart attack patients: By quitting, you reduce your risk of a second heart attack.   Lung, head, and neck cancer patients: By quitting, you reduce your chance of a second cancer.   Parents of children and adolescents: By quitting, you protect your children from illnesses caused by secondhand smoke.  QUESTIONS TO THINK ABOUT Think about the following questions before you try to stop smoking. You may want to talk about your answers with your caregiver.  Why do you want to quit?   If you tried to quit in the past, what helped and what did not?   What will be the most difficult situations for you after you quit? How will you plan to handle them?   Who can help you through the tough times? Your family? Friends? Caregiver?   What pleasures do you get from smoking? What ways can you still get pleasure if you quit?  Here are some questions to ask your caregiver:  How can you help me to be successful at quitting?   What medicine do you think would be best for me and how should I take it?   What should I do if I need more help?   What is smoking withdrawal like? How can I get information on withdrawal?  Quitting takes  hard work and a lot of effort, but you can quit smoking. FOR MORE INFORMATION  Smokefree.gov (http://www.davis-sullivan.com/) provides free, accurate, evidence-based information and professional assistance to help support the immediate and long-term needs of people trying to quit smoking. Document Released: 07/23/2001 Document Revised: 07/18/2011 Document Reviewed: 05/15/2009 [facility] Patient Information 2012 [place], [place].

## 2012-01-29 NOTE — Progress Notes (Signed)
  Subjective:    Patient ID: Robert Newton, male    DOB: [birth year], 66 y.o.   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  Robert Newton comes in today for a regular followup.  #1 his blood pressure is very well-controlled on amlodipine.  #2 his cholesterol was at goal last checked in April 2012 on Lipitor  #3 patient continues to smoke half pack a day and we discussed smoking cessation strategies  #4 patient continues to be on anticoagulation with warfarin for his right carotid artery stenosis  #5 patient to stop taking aspirin abruptly and I stressed the importance of aspirin and secondary prevention of stroke. Patient agreed to buy aspirin  No other complaints at this time.  Review of Systems  Constitutional: Negative for fever, activity change and appetite change.  HENT: Negative for sore throat.   Respiratory: Negative for cough and shortness of breath.   Cardiovascular: Negative for chest pain and leg swelling.  Gastrointestinal: Negative for nausea, abdominal pain, diarrhea, constipation and abdominal distention.  Genitourinary: Negative for frequency, hematuria and difficulty urinating.  Neurological: Negative for dizziness and headaches.  Psychiatric/Behavioral: Negative for suicidal ideas and behavioral problems.       Objective:   Physical Exam  Constitutional: He is oriented to person, place, and time. He appears well-developed and well-nourished.       Patient has left-sided facial droop  HENT:  Head: Normocephalic and atraumatic.  Eyes: Conjunctivae and EOM are normal. Pupils are equal, round, and reactive to light. No scleral icterus.  Neck: Normal range of motion. Neck supple. No JVD present. No thyromegaly present.  Cardiovascular: Normal rate, regular rhythm, normal heart sounds and intact distal pulses.  Exam  reveals no gallop and no friction rub.   No murmur heard. Pulmonary/Chest: Effort normal and breath sounds normal. No respiratory distress. He has no wheezes. He has no rales.  Abdominal: Soft. Bowel sounds are normal. He exhibits no distension and no mass. There is no tenderness. There is no rebound and no guarding.  Musculoskeletal: Normal range of motion. He exhibits no edema and no tenderness.  Lymphadenopathy:    He has no cervical adenopathy.  Neurological: He is alert and oriented to person, place, and time.       Left hemiparesis  Psychiatric: He has a normal mood and affect. His behavior is normal.          Assessment & Plan:

## 2012-01-29 NOTE — Assessment & Plan Note (Signed)
I will check lipid profile today. Continue atorvastatin.

## 2012-01-29 NOTE — Assessment & Plan Note (Signed)
Patient's BP is well controlled. Check Crt and K today.

## 2012-01-29 NOTE — Assessment & Plan Note (Signed)
Social worker consult today. He is still smoking 1/2 a pack a day. Wants to cut down and is aggreable to social work consult.

## 2012-02-04 ENCOUNTER — Encounter: Payer: Self-pay | Admitting: Licensed Clinical Social Worker

## 2012-02-04 ENCOUNTER — Telehealth: Payer: Self-pay | Admitting: Licensed Clinical Social Worker

## 2012-02-04 NOTE — Telephone Encounter (Signed)
Mr. Minus was referred to CSW for smoking cessation.  Pt has quit smoking in the past for 11 months, but restarted.  Mr. Cowdrey smokes Marlboro's, one pack every two days.  Pt states he is motivated to quit, but hesitant to set a quit date.  Pt states he tried "two little white pills" but reports they made him want to smoke more.  Mr. Hillock requesting CSW to place information in the mail.  Pt also inquiring about Medicare and dental benefits.  CSW informed Mr. Aker, medicare does not have a dental benefit but will send pt info on [facility].  CSW also encouraged pt to call his [facility] rep to inquire about discounted dental services.  CSW will send requested information.

## 2012-02-10 ENCOUNTER — Ambulatory Visit (INDEPENDENT_AMBULATORY_CARE_PROVIDER_SITE_OTHER): Payer: PRIVATE HEALTH INSURANCE | Admitting: Pharmacist

## 2012-02-10 DIAGNOSIS — Z7901 Long term (current) use of anticoagulants: Secondary | ICD-10-CM

## 2012-02-10 DIAGNOSIS — I6789 Other cerebrovascular disease: Secondary | ICD-10-CM

## 2012-02-10 MED ORDER — WARFARIN SODIUM 3 MG PO TABS: ORAL_TABLET | ORAL | Status: DC

## 2012-02-10 NOTE — Progress Notes (Signed)
Anti-Coagulation Progress Note  Robert Newton is a 66 y.o. male who is currently on an anti-coagulation regimen.    RECENT RESULTS: Recent results are below, the most recent result is correlated with a dose of 19.5 mg. per week: Lab Results  Component Value Date   INR 2.90 02/10/2012   INR 2.20 01/13/2012   INR 2.40 12/16/2011    ANTI-COAG DOSE:   Latest dosing instructions   Total Sun Mon Tue Wed Thu Fri Sat   18 3 mg 3 mg 1.5 mg 3 mg 3 mg 1.5 mg 3 mg    (3 mg1) (3 mg1) (3 mg0.5) (3 mg1) (3 mg1) (3 mg0.5) (3 mg1)         ANTICOAG SUMMARY: Anticoagulation Episode Summary              Current INR goal 2.0-3.0 Next INR check 03/09/2012   INR from last check 2.90 (02/10/2012)     Weekly max dose (mg)  Target end date Indefinite   Indications CEREBROVASCULAR ACCIDENT, ACUTE, Long term current use of anticoagulant   INR check location [facility]    Send INR reminders to ANTICOAG [facility]   Comments        Provider Role Specialty Phone number   Burns Spain, MD  Internal Medicine [phone]        ANTICOAG TODAY: Anticoagulation Summary as of 02/10/2012              INR goal 2.0-3.0     Selected INR 2.90 (02/10/2012) Next INR check 03/09/2012   Weekly max dose (mg)  Target end date Indefinite   Indications CEREBROVASCULAR ACCIDENT, ACUTE, Long term current use of anticoagulant    Anticoagulation Episode Summary              INR check location [facility] Preferred lab    Send INR reminders to ANTICOAG [facility]   Comments        Provider Role Specialty Phone number   Burns Spain, MD  Internal Medicine [phone]        PATIENT INSTRUCTIONS: Patient Instructions  Patient instructed to take medications as defined in the Anti-coagulation Track section of this encounter.  Patient instructed to take today's dose.  Patient verbalized understanding of these instructions.        FOLLOW-UP Return in about 4 weeks (around 03/09/2012) for  Follow up INR at 0845h.  Hulen Luster, III Pharm.D., CACP

## 2012-02-10 NOTE — Patient Instructions (Signed)
Patient instructed to take medications as defined in the Anti-coagulation Track section of this encounter.  Patient instructed to take today's dose.  Patient verbalized understanding of these instructions.    

## 2012-03-09 ENCOUNTER — Ambulatory Visit (INDEPENDENT_AMBULATORY_CARE_PROVIDER_SITE_OTHER): Payer: PRIVATE HEALTH INSURANCE | Admitting: Pharmacist

## 2012-03-09 ENCOUNTER — Other Ambulatory Visit: Payer: Self-pay | Admitting: Internal Medicine

## 2012-03-09 DIAGNOSIS — I639 Cerebral infarction, unspecified: Secondary | ICD-10-CM

## 2012-03-09 DIAGNOSIS — I6789 Other cerebrovascular disease: Secondary | ICD-10-CM

## 2012-03-09 DIAGNOSIS — Z7901 Long term (current) use of anticoagulants: Secondary | ICD-10-CM

## 2012-03-09 MED ORDER — WARFARIN SODIUM 3 MG PO TABS: ORAL_TABLET | ORAL | Status: DC

## 2012-03-09 NOTE — Progress Notes (Signed)
Anti-Coagulation Progress Note  Tab Rylee is a 66 y.o. male who is currently on an anti-coagulation regimen.    RECENT RESULTS: Recent results are below, the most recent result is correlated with a dose of 18 mg. per week: Lab Results  Component Value Date   INR 2.20 03/09/2012   INR 2.90 02/10/2012   INR 2.20 01/13/2012    ANTI-COAG DOSE:   Latest dosing instructions   Total Sun Mon Tue Wed Thu Fri Sat   18 3 mg 3 mg 1.5 mg 3 mg 3 mg 1.5 mg 3 mg    (3 mg1) (3 mg1) (3 mg0.5) (3 mg1) (3 mg1) (3 mg0.5) (3 mg1)         ANTICOAG SUMMARY: Anticoagulation Episode Summary              Current INR goal 2.0-3.0 Next INR check 04/06/2012   INR from last check 2.20 (03/09/2012)     Weekly max dose (mg)  Target end date Indefinite   Indications CEREBROVASCULAR ACCIDENT, ACUTE, Long term current use of anticoagulant   [facility]R check location [facility]    Send INR reminders to ANTICOAG [facility]   Comments        Provider Role Specialty Phone number   Burns Spain, MD  Internal Medicine [phone]        ANTICOAG TODAY: Anticoagulation Summary as of 03/09/2012              INR goal 2.0-3.0     Selected INR 2.20 (03/09/2012) Next INR check 04/06/2012   Weekly max dose (mg)  Target end date Indefinite   Indications CEREBROVASCULAR ACCIDENT, ACUTE, Long term current use of anticoagulant    Anticoagulation Episode Summary              INR check location [facility] lab    Send INR reminders to ANTICOAG [facility]   Comments        Provider Role Specialty Phone number   Burns Spain, MD  Internal Medicine [phone]        PATIENT INSTRUCTIONS: Patient Instructions  Patient instructed to take medications as defined in the Anti-coagulation Track section of this encounter.  Patient instructed to take today's dose.  Patient verbalized understanding of these instructions.        FOLLOW-UP Return in 4 weeks (on 04/06/2012) for Follow up  INR at 0845h.  Hulen Luster, III Pharm.D., CACP

## 2012-03-09 NOTE — Patient Instructions (Signed)
Patient instructed to take medications as defined in the Anti-coagulation Track section of this encounter.  Patient instructed to take today's dose.  Patient verbalized understanding of these instructions.    

## 2012-03-10 NOTE — Progress Notes (Signed)
Agree with plan 

## 2012-04-06 ENCOUNTER — Ambulatory Visit (INDEPENDENT_AMBULATORY_CARE_PROVIDER_SITE_OTHER): Payer: PRIVATE HEALTH INSURANCE | Admitting: Pharmacist

## 2012-04-06 DIAGNOSIS — Z7901 Long term (current) use of anticoagulants: Secondary | ICD-10-CM

## 2012-04-06 DIAGNOSIS — I6789 Other cerebrovascular disease: Secondary | ICD-10-CM

## 2012-04-06 NOTE — Progress Notes (Signed)
Dr. Groce's note reviewed and I agree with the assessment and plan as outlined. 

## 2012-04-06 NOTE — Progress Notes (Signed)
Anti-Coagulation Progress Note  Robert Newton is a 66 y.o. male who is currently on an anti-coagulation regimen.    RECENT RESULTS: Recent results are below, the most recent result is correlated with a dose of 18 mg. per week: Lab Results  Component Value Date   INR 2.00 04/06/2012   INR 2.20 03/09/2012   INR 2.90 02/10/2012    ANTI-COAG DOSE:   Latest dosing instructions   Total Sun Mon Tue Wed Thu Fri Sat   19.5 3 mg 3 mg 3 mg 3 mg 3 mg 3 mg 1.5 mg    (3 mg1) (3 mg1) (3 mg1) (3 mg1) (3 mg1) (3 mg1) (3 mg0.5)         ANTICOAG SUMMARY: Anticoagulation Episode Summary              Current INR goal 2.0-3.0 Next INR check 05/04/2012   INR from last check 2.00 (04/06/2012)     Weekly max dose (mg)  Target end date Indefinite   Indications CEREBROVASCULAR ACCIDENT, ACUTE, Long term current use of anticoagulant   [facility]R check location [facility]    Send INR reminders to ANTICOAG [facility]   Comments        Provider Role Specialty Phone number   Burns Spain, MD  Internal Medicine [phone]        ANTICOAG TODAY: Anticoagulation Summary as of 04/06/2012              INR goal 2.0-3.0     Selected INR 2.00 (04/06/2012) Next INR check 05/04/2012   Weekly max dose (mg)  Target end date Indefinite   Indications CEREBROVASCULAR ACCIDENT, ACUTE, Long term current use of anticoagulant    Anticoagulation Episode Summary              INR check location [facility]    Send INR reminders to ANTICOAG [facility]   Comments        Provider Role Specialty Phone number   Burns Spain, MD  Internal Medicine [phone]        PATIENT INSTRUCTIONS: Patient Instructions  Patient instructed to take medications as defined in the Anti-coagulation Track section of this encounter.  Patient instructed to take today's dose.  Patient verbalized understanding of these instructions.        FOLLOW-UP Return in 4 weeks (on 05/04/2012) for Follow up   INR at 0845h.  Hulen Luster, III Pharm.D., CACP

## 2012-04-06 NOTE — Patient Instructions (Signed)
Patient instructed to take medications as defined in the Anti-coagulation Track section of this encounter.  Patient instructed to take today's dose.  Patient verbalized understanding of these instructions.    

## 2012-05-04 ENCOUNTER — Ambulatory Visit (INDEPENDENT_AMBULATORY_CARE_PROVIDER_SITE_OTHER): Payer: PRIVATE HEALTH INSURANCE | Admitting: Pharmacist

## 2012-05-04 DIAGNOSIS — I6789 Other cerebrovascular disease: Secondary | ICD-10-CM

## 2012-05-04 DIAGNOSIS — Z7901 Long term (current) use of anticoagulants: Secondary | ICD-10-CM

## 2012-05-04 MED ORDER — WARFARIN SODIUM 3 MG PO TABS: ORAL_TABLET | ORAL | Status: DC

## 2012-05-04 NOTE — Patient Instructions (Signed)
Patient instructed to take medications as defined in the Anti-coagulation Track section of this encounter.  Patient instructed to take today's dose.  Patient verbalized understanding of these instructions.    

## 2012-05-04 NOTE — Progress Notes (Signed)
Anti-Coagulation Progress Note  Robert Newton is a 66 y.o. male who is currently on an anti-coagulation regimen.    RECENT RESULTS: Recent results are below, the most recent result is correlated with a dose of 19.5 mg. per week: Lab Results  Component Value Date   INR 3.30 05/04/2012   INR 2.00 04/06/2012   INR 2.20 03/09/2012    ANTI-COAG DOSE:   Latest dosing instructions   Total Sun Mon Tue Wed Thu Fri Sat   18 3 mg 1.5 mg 3 mg 3 mg 1.5 mg 3 mg 3 mg    (3 mg1) (3 mg0.5) (3 mg1) (3 mg1) (3 mg0.5) (3 mg1) (3 mg1)         ANTICOAG SUMMARY: Anticoagulation Episode Summary              Current INR goal 2.0-3.0 Next INR check 06/08/2012   INR from last check 3.30! (05/04/2012)     Weekly max dose (mg)  Target end date Indefinite   Indications CEREBROVASCULAR ACCIDENT, ACUTE, Long term current use of anticoagulant   INR check location [facility]    Send INR reminders to ANTICOAG [facility]   Comments        Provider Role Specialty Phone number   Burns Spain, MD  Internal Medicine [phone]        ANTICOAG TODAY: Anticoagulation Summary as of 05/04/2012              INR goal 2.0-3.0     Selected INR 3.30! (05/04/2012) Next INR check 06/08/2012   Weekly max dose (mg)  Target end date Indefinite   Indications CEREBROVASCULAR ACCIDENT, ACUTE, Long term current use of anticoagulant    Anticoagulation Episode Summary              INR check location [facility]    Send INR reminders to ANTICOAG [facility]   Comments        Provider Role Specialty Phone number   Burns Spain, MD  Internal Medicine [phone]        PATIENT INSTRUCTIONS: Patient Instructions  Patient instructed to take medications as defined in the Anti-coagulation Track section of this encounter.  Patient instructed to take today's dose.  Patient verbalized understanding of these instructions.        FOLLOW-UP Return in 5 weeks (on 06/08/2012) for  Follow up INR at 0845h.  Hulen Luster, III Pharm.D., CACP

## 2012-05-05 NOTE — Progress Notes (Signed)
Agree with plan 

## 2012-05-06 ENCOUNTER — Other Ambulatory Visit: Payer: Self-pay | Admitting: Internal Medicine

## 2012-05-26 ENCOUNTER — Other Ambulatory Visit: Payer: Self-pay | Admitting: Internal Medicine

## 2012-06-08 ENCOUNTER — Ambulatory Visit (INDEPENDENT_AMBULATORY_CARE_PROVIDER_SITE_OTHER): Payer: PRIVATE HEALTH INSURANCE | Admitting: Pharmacist

## 2012-06-08 DIAGNOSIS — Z7901 Long term (current) use of anticoagulants: Secondary | ICD-10-CM

## 2012-06-08 DIAGNOSIS — I6789 Other cerebrovascular disease: Secondary | ICD-10-CM

## 2012-06-08 MED ORDER — WARFARIN SODIUM 3 MG PO TABS: ORAL_TABLET | ORAL | Status: DC

## 2012-06-08 NOTE — Progress Notes (Signed)
Anti-Coagulation Progress Note  Robert Newton is a 66 y.o. male who is currently on an anti-coagulation regimen.    RECENT RESULTS: Recent results are below, the most recent result is correlated with a dose of 18 mg. per week: Lab Results  Component Value Date   INR 3.0 06/08/2012   INR 3.30 05/04/2012   INR 2.00 04/06/2012    ANTI-COAG DOSE:   Latest dosing instructions   Total Sun Mon Tue Wed Thu Fri Sat   16.5 3 mg 1.5 mg 3 mg 1.5 mg 3 mg 1.5 mg 3 mg    (3 mg1) (3 mg0.5) (3 mg1) (3 mg0.5) (3 mg1) (3 mg0.5) (3 mg1)         ANTICOAG SUMMARY: Anticoagulation Episode Summary              Current INR goal 2.0-3.0 Next INR check 07/06/2012   INR from last check 3.0 (06/08/2012)     Weekly max dose (mg)  Target end date Indefinite   Indications CEREBROVASCULAR ACCIDENT, ACUTE, Long term current use of anticoagulant   [facility]R check location [facility]    Send INR reminders to ANTICOAG [facility]   Comments        Provider Role Specialty Phone number   Burns Spain, MD  Internal Medicine [phone]        ANTICOAG TODAY: Anticoagulation Summary as of 06/08/2012              INR goal 2.0-3.0     Selected INR 3.0 (06/08/2012) Next INR check 07/06/2012   Weekly max dose (mg)  Target end date Indefinite   Indications CEREBROVASCULAR ACCIDENT, ACUTE, Long term current use of anticoagulant    Anticoagulation Episode Summary              INR check location [facility]    Send INR reminders to ANTICOAG [facility]   Comments        Provider Role Specialty Phone number   Burns Spain, MD  Internal Medicine [phone]        PATIENT INSTRUCTIONS: Patient Instructions  Patient instructed to take medications as defined in the Anti-coagulation Track section of this encounter.  Patient instructed to today's dose.  Patient verbalized understanding of these instructions.      FOLLOW-UP Return in 4 weeks (on 07/06/2012) for Follow  up INR at 0845h.  Hulen Luster, III Pharm.D., CACP

## 2012-06-08 NOTE — Patient Instructions (Signed)
Patient instructed to take medications as defined in the Anti-coagulation Track section of this encounter.  Patient instructed to  today's dose.  Patient verbalized understanding of these instructions.     

## 2012-07-06 ENCOUNTER — Ambulatory Visit (INDEPENDENT_AMBULATORY_CARE_PROVIDER_SITE_OTHER): Payer: PRIVATE HEALTH INSURANCE | Admitting: Pharmacist

## 2012-07-06 DIAGNOSIS — Z7901 Long term (current) use of anticoagulants: Secondary | ICD-10-CM

## 2012-07-06 DIAGNOSIS — I6789 Other cerebrovascular disease: Secondary | ICD-10-CM

## 2012-07-06 MED ORDER — WARFARIN SODIUM 3 MG PO TABS: ORAL_TABLET | ORAL | Status: DC

## 2012-07-06 NOTE — Patient Instructions (Signed)
Patient instructed to take medications as defined in the Anti-coagulation Track section of this encounter.  Patient instructed to take today's dose.  Patient verbalized understanding of these instructions.    

## 2012-07-06 NOTE — Progress Notes (Signed)
Anti-Coagulation Progress Note  Robert Newton is a 66 y.o. male who is currently on an anti-coagulation regimen.    RECENT RESULTS: Recent results are below, the most recent result is correlated with a dose of 16.5 mg. 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], Long term current use of anticoagulant [V58.61]   [facility] check location [facility]    Send INR reminders to [facility]   Comments        Provider Role Specialty Phone number   Burns Spain, MD  Internal Medicine [phone]        ANTICOAG TODAY: Anticoagulation Summary as of 07/06/2012              INR goal 2.0-3.0     Selected INR 2.20 (07/06/2012) Next INR check 08/03/2012   Weekly max dose (mg)  Target end date Indefinite   Indications CEREBROVASCULAR ACCIDENT, ACUTE [436], Long term current use of anticoagulant [V58.61]    Anticoagulation Episode Summary              [facility] check location [facility] lab    Send INR reminders to [facility]   Comments        Provider Role Specialty Phone number   Burns Spain, MD  Internal Medicine [phone]        PATIENT INSTRUCTIONS: Patient Instructions  Patient instructed to take medications as defined in the Anti-coagulation Track section of this encounter.  Patient instructed to take today's dose.  Patient verbalized understanding of these instructions.         FOLLOW-UP Return in 4 weeks (on 08/03/2012) for Follow up INR at 0830h.  Hulen Luster, III Pharm.D., CACP

## 2012-07-06 NOTE — Addendum Note (Signed)
Addended by: Hulen Luster B on: 07/06/2012 10:15 AM   Modules accepted: Orders

## 2012-07-23 ENCOUNTER — Other Ambulatory Visit: Payer: Self-pay | Admitting: Internal Medicine

## 2012-08-03 ENCOUNTER — Ambulatory Visit (INDEPENDENT_AMBULATORY_CARE_PROVIDER_SITE_OTHER): Payer: PRIVATE HEALTH INSURANCE | Admitting: Pharmacist

## 2012-08-03 DIAGNOSIS — I6789 Other cerebrovascular disease: Secondary | ICD-10-CM

## 2012-08-03 DIAGNOSIS — Z7901 Long term (current) use of anticoagulants: Secondary | ICD-10-CM

## 2012-08-03 LAB — POCT INR: INR: 2.1

## 2012-08-03 MED ORDER — WARFARIN SODIUM 3 MG PO TABS: ORAL_TABLET | ORAL | Status: DC

## 2012-08-03 NOTE — Progress Notes (Signed)
Anti-Coagulation Progress Note  Christoph Copelan is a 66 y.o. male who is currently on an anti-coagulation regimen.    RECENT RESULTS: Recent results are below, the most recent result is correlated with a dose of 16.5 mg. 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], Long term current use of anticoagulant [V58.61]   [facility] check location [facility] lab    Send INR reminders to [facility]   Comments        Provider Role Specialty Phone number   Burns Spain, MD  Internal Medicine [phone]        ANTICOAG TODAY: Anticoagulation Summary as of 08/03/2012              INR goal 2.0-3.0     Selected INR 2.10 (08/03/2012) Next INR check 08/24/2012   Weekly max dose (mg)  Target end date Indefinite   Indications CEREBROVASCULAR ACCIDENT, ACUTE [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  Patient instructed to take today's dose.  Patient verbalized understanding of these instructions.         FOLLOW-UP Return in 3 weeks (on 08/24/2012) for Follow up INR at 0830h.  Hulen Luster, III Pharm.D., CACP

## 2012-08-03 NOTE — Patient Instructions (Signed)
Patient instructed to take medications as defined in the Anti-coagulation Track section of this encounter.  Patient instructed to take today's dose.  Patient verbalized understanding of these instructions.    

## 2012-08-24 ENCOUNTER — Ambulatory Visit (INDEPENDENT_AMBULATORY_CARE_PROVIDER_SITE_OTHER): Payer: PRIVATE HEALTH INSURANCE | Admitting: Pharmacist

## 2012-08-24 DIAGNOSIS — I6789 Other cerebrovascular disease: Secondary | ICD-10-CM

## 2012-08-24 DIAGNOSIS — Z7901 Long term (current) use of anticoagulants: Secondary | ICD-10-CM

## 2012-08-24 NOTE — Patient Instructions (Signed)
Patient instructed to take medications as defined in the Anti-coagulation Track section of this encounter.  Patient instructed to take today's dose.  Patient verbalized understanding of these instructions.    

## 2012-08-24 NOTE — Progress Notes (Signed)
Anti-Coagulation Progress Note  Robert Newton is a 67 y.o. male who is currently on an anti-coagulation regimen.    RECENT RESULTS: Recent results are below, the most recent result is correlated with a dose of 16.5 mg. per week: Lab Results  Component Value Date   INR 2.00 08/24/2012   INR 2.10 08/03/2012   INR 2.20 07/06/2012    ANTI-COAG DOSE:   Latest dosing instructions   Total Sun Mon Tue Wed Thu Fri Sat   19.5 3 mg 3 mg 3 mg 1.5 mg 3 mg 3 mg 3 mg    (3 mg1) (3 mg1) (3 mg1) (3 mg0.5) (3 mg1) (3 mg1) (3 mg1)         ANTICOAG SUMMARY: Anticoagulation Episode Summary              Current INR goal 2.0-3.0 Next INR check 09/14/2012   INR from last check 2.00 (08/24/2012)     Weekly max dose (mg)  Target end date Indefinite   Indications CEREBROVASCULAR ACCIDENT, ACUTE [436], Long term current use of anticoagulant [V58.61]   [facility] check location [facility]    Send INR reminders to [facility]   Comments        Provider Role Specialty Phone number   Burns Spain, MD  Internal Medicine [phone]        ANTICOAG TODAY: Anticoagulation Summary as of 08/24/2012              INR goal 2.0-3.0     Selected INR 2.00 (08/24/2012) Next INR check 09/14/2012   Weekly max dose (mg)  Target end date Indefinite   Indications CEREBROVASCULAR ACCIDENT, ACUTE [436], Long term current use of anticoagulant [V58.61]    Anticoagulation Episode Summary              [facility] check [facility]    Send INR reminders to [facility]   Comments        Provider Role Specialty Phone number   Burns Spain, MD  Internal Medicine [phone]        PATIENT INSTRUCTIONS: Patient Instructions  Patient instructed to take medications as defined in the Anti-coagulation Track section of this encounter.  Patient instructed to take today's dose.  Patient verbalized understanding of these instructions.        FOLLOW-UP Return in 3 weeks  (on 09/14/2012) for Follow up INR at 0830h.  Hulen Luster, III Pharm.D., CACP

## 2012-09-13 ENCOUNTER — Other Ambulatory Visit: Payer: Self-pay | Admitting: Internal Medicine

## 2012-09-14 ENCOUNTER — Ambulatory Visit (INDEPENDENT_AMBULATORY_CARE_PROVIDER_SITE_OTHER): Payer: PRIVATE HEALTH INSURANCE | Admitting: Pharmacist

## 2012-09-14 DIAGNOSIS — I6789 Other cerebrovascular disease: Secondary | ICD-10-CM

## 2012-09-14 DIAGNOSIS — Z7901 Long term (current) use of anticoagulants: Secondary | ICD-10-CM

## 2012-09-14 LAB — POCT INR: INR: 3.2

## 2012-09-14 NOTE — Patient Instructions (Signed)
Patient instructed to take medications as defined in the Anti-coagulation Track section of this encounter.  Patient instructed to take today's dose.  Patient verbalized understanding of these instructions.    

## 2012-09-14 NOTE — Progress Notes (Signed)
Agree with plan 

## 2012-09-14 NOTE — Progress Notes (Signed)
Anti-Coagulation Progress Note  Robert Newton is a 66 y.o. male who is currently on an anti-coagulation regimen.    RECENT RESULTS: Recent results are below, the most recent result is correlated with a dose of 19.5 mg. per week: Lab Results  Component Value Date   INR 3.2 09/14/2012   INR 2.00 08/24/2012   INR 2.10 08/03/2012    ANTI-COAG DOSE:   Latest dosing instructions   Total Sun Mon Tue Wed Thu Fri Sat   18 3 mg 3 mg 1.5 mg 3 mg 3 mg 1.5 mg 3 mg    (3 mg1) (3 mg1) (3 mg0.5) (3 mg1) (3 mg1) (3 mg0.5) (3 mg1)         ANTICOAG SUMMARY: Anticoagulation Episode Summary              Current INR goal 2.0-3.0 Next INR check 10/12/2012   INR from last check 3.2! (09/14/2012)     Weekly max dose (mg)  Target end date Indefinite   Indications CEREBROVASCULAR ACCIDENT, ACUTE [436], Long term current use of anticoagulant [V58.61]   [facility]R check location [facility]    Send INR reminders to [facility]   Comments        Provider Role Specialty Phone number   Burns Spain, MD  Internal Medicine [phone]        ANTICOAG TODAY: Anticoagulation Summary as of 09/14/2012              INR goal 2.0-3.0     Selected INR 3.2! 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  Patient instructed to take today's dose.  Patient verbalized understanding of these instructions.        FOLLOW-UP Return in 4 weeks (on  10/12/2012) for Follow up INR at 0845h.  Hulen Luster, III Pharm.D., CACP

## 2012-10-05 ENCOUNTER — Ambulatory Visit (INDEPENDENT_AMBULATORY_CARE_PROVIDER_SITE_OTHER): Payer: PRIVATE HEALTH INSURANCE | Admitting: Internal Medicine

## 2012-10-05 ENCOUNTER — Encounter: Payer: Self-pay | Admitting: Internal Medicine

## 2012-10-05 VITALS — BP 116/77 | HR 75 | Temp 96.8°F | Ht 73.0 in | Wt 153.5 lb

## 2012-10-05 DIAGNOSIS — I10 Essential (primary) hypertension: Secondary | ICD-10-CM

## 2012-10-05 DIAGNOSIS — I69959 Hemiplegia and hemiparesis following unspecified cerebrovascular disease affecting unspecified side: Secondary | ICD-10-CM

## 2012-10-05 DIAGNOSIS — Z23 Encounter for immunization: Secondary | ICD-10-CM

## 2012-10-05 DIAGNOSIS — Z299 Encounter for prophylactic measures, unspecified: Secondary | ICD-10-CM | POA: Insufficient documentation

## 2012-10-05 MED ORDER — OMEPRAZOLE 20 MG PO CPDR: DELAYED_RELEASE_CAPSULE | ORAL | Status: DC

## 2012-10-05 NOTE — Assessment & Plan Note (Signed)
Flu shot given today. Pneumococcal vaccine given today.

## 2012-10-05 NOTE — Addendum Note (Signed)
Addended by: Angelina Ok F on: 10/05/2012 11:51 AM   Modules accepted: Orders

## 2012-10-05 NOTE — Progress Notes (Signed)
  Subjective:    Patient ID: Robert Newton, male    DOB: [birth year], 67 y.o.   MRN: [id number]  HPI Patient is a 67 year old patient of mine who is here today for regular checkup.  Patient's blood pressure is well-controlled.  He has no other complaints at this time.  Patient continues to smoke about half pack a day and not willing to quit at this time.   Review of Systems  Constitutional: Negative for fever, activity change and appetite change.  HENT: Negative for sore throat.   Respiratory: Negative for cough and shortness of breath.   Cardiovascular: Negative for chest pain and leg swelling.  Gastrointestinal: Negative for nausea, abdominal pain, diarrhea, constipation and abdominal distention.  Genitourinary: Negative for frequency, hematuria and difficulty urinating.  Neurological: Negative for dizziness and headaches.  Psychiatric/Behavioral: Negative for suicidal ideas and behavioral problems.       Objective:   Physical Exam  Constitutional: He is oriented to person, place, and time. He appears well-developed and well-nourished.  HENT:  Head: Normocephalic and atraumatic.  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 Pupils are equal, round, and reactive to light. No scleral icterus.  Neck: Normal range of motion. Neck supple. No JVD present. No thyromegaly present.  Cardiovascular: Normal rate, regular rhythm, normal heart sounds and intact distal pulses.  Exam reveals no gallop and no friction rub.   No murmur heard. Pulmonary/Chest: Effort normal and breath sounds normal. No respiratory distress. He has no wheezes. He has no rales.  Abdominal: Soft. Bowel sounds are normal. He exhibits no distension and no mass. There is no tenderness. There is no rebound and no guarding.  Musculoskeletal: Normal range of motion. He exhibits no edema and no tenderness.  Lymphadenopathy:    He has no cervical adenopathy.  Neurological:  He is alert and oriented to person, place, and time.  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 His behavior is normal.          Assessment & Plan:

## 2012-10-05 NOTE — Assessment & Plan Note (Signed)
Continue aspirin, statin. I counseled him to stop smoking but patient is not ready to quit at this time.

## 2012-10-05 NOTE — Assessment & Plan Note (Signed)
Patient's blood pressure is well-controlled at this time. I will check kidney function test. Patient's blood pressure is 116 systolic. Given that patient is only on 2.5 mg of Norvasc I will try discontinuing and repeating the blood pressure. Patient was told to check his blood pressure at drugstore and inform the clinic if it is >140/90

## 2012-10-05 NOTE — Patient Instructions (Signed)
Smoking Cessation Quitting smoking is important to your health and has many advantages. However, it is not always easy to quit since nicotine is a very addictive drug. Often times, people try 3 times or more before being able to quit. This document explains the best ways for you to prepare to quit smoking. Quitting takes hard work and a lot of effort, but you can do it. ADVANTAGES OF QUITTING SMOKING  You will live longer, feel better, and live better.  Your body will feel the impact of quitting smoking almost immediately.  Within 20 minutes, blood pressure decreases. Your pulse returns to its normal level.  After 8 hours, carbon monoxide levels in the blood return to normal. Your oxygen level increases.  After 24 hours, the chance of having a heart attack starts to decrease. Your breath, hair, and body stop smelling like smoke.  After 48 hours, damaged nerve endings begin to recover. Your sense of taste and smell improve.  After 72 hours, the body is virtually free of nicotine. Your bronchial tubes relax and breathing becomes easier.  After 2 to 12 weeks, lungs can hold more air. Exercise becomes easier and circulation improves.  The risk of having a heart attack, stroke, cancer, or lung disease is greatly reduced.  After 1 year, the risk of coronary heart disease is cut in half.  After 5 years, the risk of stroke falls to the same as a nonsmoker.  After 10 years, the risk of lung cancer is cut in half and the risk of other cancers decreases significantly.  After 15 years, the risk of coronary heart disease drops, usually to the level of a nonsmoker.  If you are pregnant, quitting smoking will improve your chances of having a healthy baby.  The people you live with, especially any children, will be healthier.  You will have extra money to spend on things other than cigarettes. QUESTIONS TO THINK ABOUT BEFORE ATTEMPTING TO QUIT You may want to talk about your answers with your  caregiver.  Why do you want to quit?  If you tried to quit in the past, what helped and what did not?  What will be the most difficult situations for you after you quit? How will you plan to handle them?  Who can help you through the tough times? Your family? Friends? A caregiver?  What pleasures do you get from smoking? What ways can you still get pleasure if you quit? Here are some questions to ask your caregiver:  How can you help me to be successful at quitting?  What medicine do you think would be best for me and how should I take it?  What should I do if I need more help?  What is smoking withdrawal like? How can I get information on withdrawal? GET READY  Set a quit date.  Change your environment by getting rid of all cigarettes, ashtrays, matches, and lighters in your home, car, or work. Do not let people smoke in your home.  Review your past attempts to quit. Think about what worked and what did not. GET SUPPORT AND ENCOURAGEMENT You have a better chance of being successful if you have help. You can get support in many ways.  Tell your family, friends, and co-workers that you are going to quit and need their support. Ask them not to smoke around you.  Get individual, group, or telephone counseling and support. Programs are available at local hospitals and health centers. Call your local health department for   information about programs in your area.  Spiritual beliefs and practices may help some smokers quit.  Download a "quit meter" on your computer to keep track of quit statistics, such as how long you have gone without smoking, cigarettes not smoked, and money saved.  Get a self-help book about quitting smoking and staying off of tobacco. LEARN NEW SKILLS AND BEHAVIORS  Distract yourself from urges to smoke. Talk to someone, go for a walk, or occupy your time with a task.  Change your normal routine. Take a different route to work. Drink tea instead of coffee.  Eat breakfast in a different place.  Reduce your stress. Take a hot bath, exercise, or read a book.  Plan something enjoyable to do every day. Reward yourself for not smoking.  Explore interactive web-based programs that specialize in helping you quit. GET MEDICINE AND USE IT CORRECTLY Medicines can help you stop smoking and decrease the urge to smoke. Combining medicine with the above behavioral methods and support can greatly increase your chances of successfully quitting smoking.  Nicotine replacement therapy helps deliver nicotine to your body without the negative effects and risks of smoking. Nicotine replacement therapy includes nicotine gum, lozenges, inhalers, nasal sprays, and skin patches. Some may be available over-the-counter and others require a prescription.  Antidepressant medicine helps people abstain from smoking, but how this works is unknown. This medicine is available by prescription.  Nicotinic receptor partial agonist medicine simulates the effect of nicotine in your brain. This medicine is available by prescription. Ask your caregiver for advice about which medicines to use and how to use them based on your health history. Your caregiver will tell you what side effects to look out for if you choose to be on a medicine or therapy. Carefully read the information on the package. Do not use any other product containing nicotine while using a nicotine replacement product.  RELAPSE OR DIFFICULT SITUATIONS Most relapses occur within the first 3 months after quitting. Do not be discouraged if you start smoking again. Remember, most people try several times before finally quitting. You may have symptoms of withdrawal because your body is used to nicotine. You may crave cigarettes, be irritable, feel very hungry, cough often, get headaches, or have difficulty concentrating. The withdrawal symptoms are only temporary. They are strongest when you first quit, but they will go away within  10 14 days. To reduce the chances of relapse, try to:  Avoid drinking alcohol. Drinking lowers your chances of successfully quitting.  Reduce the amount of caffeine you consume. Once you quit smoking, the amount of caffeine in your body increases and can give you symptoms, such as a rapid heartbeat, sweating, and anxiety.  Avoid smokers because they can make you want to smoke.  Do not let weight gain distract you. Many smokers will gain weight when they quit, usually less than 10 pounds. Eat a healthy diet and stay active. You can always lose the weight gained after you quit.  Find ways to improve your mood other than smoking. FOR MORE INFORMATION  www.smokefree.gov  Document Released: 07/23/2001 Document Revised: 01/28/2012 Document Reviewed: 11/07/2011 [employer] Patient Information 2013 [employer].  

## 2012-10-12 ENCOUNTER — Other Ambulatory Visit: Payer: PRIVATE HEALTH INSURANCE

## 2012-10-12 ENCOUNTER — Ambulatory Visit (INDEPENDENT_AMBULATORY_CARE_PROVIDER_SITE_OTHER): Payer: PRIVATE HEALTH INSURANCE | Admitting: Pharmacist

## 2012-10-12 DIAGNOSIS — I6789 Other cerebrovascular disease: Secondary | ICD-10-CM

## 2012-10-12 DIAGNOSIS — Z7901 Long term (current) use of anticoagulants: Secondary | ICD-10-CM

## 2012-10-12 LAB — POCT INR: INR: 2.7

## 2012-10-12 NOTE — Progress Notes (Signed)
Patient left after [facility] clinic visit.  Did not stop by Clinic Lab. Epic was down at time of patient's arrival.  Unaware of additional laboratory need.

## 2012-10-12 NOTE — Patient Instructions (Signed)
Patient instructed to take medications as defined in the Anti-coagulation Track section of this encounter.  Patient instructed to take today's dose.  Patient verbalized understanding of these instructions.    

## 2012-10-12 NOTE — Progress Notes (Signed)
Anti-Coagulation Progress Note  Robert Newton is a 67 y.o. male who is currently on an anti-coagulation regimen.    RECENT RESULTS: Recent results are below, the most recent result is correlated with a dose of 18 mg. per week: Lab Results  Component Value Date   INR 2.70 10/12/2012   INR 3.2 09/14/2012   INR 2.00 08/24/2012    ANTI-COAG DOSE: Anticoagulation Dose Instructions as of 10/12/2012     [name] Tue Wed Thu Fri Sat   New Dose 3 mg 3 mg 1.5 mg 3 mg 3 mg 1.5 mg 3 mg       ANTICOAG SUMMARY: Anticoagulation Episode Summary   Current INR goal 2.0-3.0  Next INR check 11/09/2012  INR from last check 2.70 (10/12/2012)  Weekly max dose   Target end date Indefinite  [facility]  Preferred lab   Send INR reminders to [facility]   Indications  CEREBROVASCULAR ACCIDENT ACUTE [436] Long term current use of anticoagulant [V58.61]        Comments       Anticoagulation Care Providers   Braydn Carneiro Role Specialty Phone number   Burns Spain, MD  Internal Medicine [phone]      ANTICOAG TODAY: Anticoagulation Summary as of 10/12/2012   INR goal 2.0-3.0  Selected INR 2.70 (10/12/2012)  Next INR check 11/09/2012  Target end date Indefinite   Indications  CEREBROVASCULAR ACCIDENT ACUTE [436] Long term current use of anticoagulant [V58.61]      Anticoagulation Episode Summary   [facility]   Preferred lab    Send INR reminders to [facility]   Comments     Anticoagulation Care Providers   Ezreal Turay Role Specialty Phone number   Burns Spain, MD  Internal Medicine [phone]      PATIENT INSTRUCTIONS: Patient Instructions  Patient instructed to take medications as defined in the Anti-coagulation Track section of this encounter.  Patient instructed to take today's dose.  Patient verbalized understanding of these instructions.       FOLLOW-UP Return in 4 weeks (on 11/09/2012) for Follow up INR at  0845h.  Hulen Luster, III Pharm.D., CACP

## 2012-10-13 ENCOUNTER — Encounter: Payer: Self-pay | Admitting: Vascular Surgery

## 2012-10-14 ENCOUNTER — Ambulatory Visit (INDEPENDENT_AMBULATORY_CARE_PROVIDER_SITE_OTHER): Payer: PRIVATE HEALTH INSURANCE | Admitting: Neurosurgery

## 2012-10-14 ENCOUNTER — Other Ambulatory Visit (INDEPENDENT_AMBULATORY_CARE_PROVIDER_SITE_OTHER): Payer: PRIVATE HEALTH INSURANCE | Admitting: *Deleted

## 2012-10-14 ENCOUNTER — Encounter: Payer: Self-pay | Admitting: Neurosurgery

## 2012-10-14 DIAGNOSIS — I6529 Occlusion and stenosis of unspecified carotid artery: Secondary | ICD-10-CM

## 2012-10-14 NOTE — Progress Notes (Signed)
[facility] [place] Carotid Office Note  CC: Carotid surveillance Referring Physician: Edilia Bo  History of Present Illness: 67 year old male patient of Dr. Edilia Bo followed for known carotid stenosis. The patient does have greater than 80% ICA stenosis on the right but this is inoperable per Dr. Adele Dan last note due to intracranial disease, the patient does have left-sided weakness from a previous stroke. There patient reports acute signs or symptoms of CVA, TIA or amaurosis fugax. The patient denies any new medical diagnoses or recent surgery.  Past Medical History  Diagnosis Date  . Hyperlipidemia   . Hypertension   . Low back pain   . Tobacco abuse   . 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 Carotid artery stenosis     right  . Asthenia   . Erectile dysfunction   . Hypokalemia   . PUD (peptic ulcer disease)     hx of  . Impetigo     hx of  . Hypokalemia   . Abnormal CT of the abdomen     abnormal lymph nodes  . Abdominal pain     RLQ    ROS: [x]  Positive   [ ]  Denies    General: [ ]  Weight loss, [ ]  Fever, [ ]  chills Neurologic: [ ]  Dizziness, [ ]  Blackouts, [ ]  Seizure [ ]  Stroke, [ ]  "Mini stroke", [ ]  Slurred speech, [ ]  Temporary blindness; [ ]  weakness in arms or legs, [ ]  Hoarseness Cardiac: [ ]  Chest pain/pressure, [ ]  Shortness of breath at rest [ ]  Shortness of breath with exertion, [ ]  Atrial fibrillation or irregular heartbeat Vascular: [ ]  Pain in legs with walking, [ ]  Pain in legs at rest, [ ]  Pain in legs at night,  [ ]  Non-healing ulcer, [ ]  Blood clot in vein/DVT,   Pulmonary: [ ]  Home oxygen, [ ]  Productive cough, [ ]  Coughing up blood, [ ]  Asthma,  [ ]  Wheezing Musculoskeletal:  [ ]  Arthritis, [ ]  Low back pain, [ ]  Joint pain Hematologic: [ ]  Easy Bruising, [ ]  Anemia; [ ]  Hepatitis Gastrointestinal: [ ]  Blood in stool, [ ]  Gastroesophageal Reflux/heartburn, [ ]  Trouble swallowing Urinary: [ ]  chronic Kidney disease, [ ]  on HD -  [ ]  MWF or [ ]  TTHS, [ ]  Burning with urination, [ ]  Difficulty urinating Skin: [ ]  Rashes, [ ]  Wounds Psychological: [ ]  Anxiety, [ ]  Depression   Social History History  Substance Use Topics  . Smoking status: Current Every Day Smoker -- 0.50 packs/day    Types: Cigarettes  . Smokeless tobacco: Former Neurosurgeon    Quit date: 08/12/1972  . Alcohol Use: No    Family History Family History  Problem Relation Age of Onset  . Hypertension Mother   . Hypertension Sister   . Diabetes Sister     No Known Allergies  Current Outpatient Prescriptions  Medication Sig Dispense Refill  . aspirin 81 MG EC tablet Take 81 mg by mouth daily.        Marland Kitchen atorvastatin (LIPITOR) 40 MG tablet TAKE 1 TABLET DAILY AT NIGHT  30 tablet  11  . docusate sodium (COLACE) 100 MG capsule Take 100 mg by mouth daily.        Marland Kitchen omeprazole (PRILOSEC) 20 MG capsule TAKE ONE CAPSULE AT BEDTIME  30 capsule  5  . warfarin (COUMADIN) 3 MG tablet Take as directed by anticoagulation clinic provider.  40 tablet  2  . amLODipine (  NORVASC) 2.5 MG tablet       . warfarin (COUMADIN) 3 MG tablet TAKE AS DIRECTED BY ANTICOAGULATION CLINIC PROVIDER.  31 tablet  2  . [DISCONTINUED] warfarin (COUMADIN) 3 MG tablet Take as directed by anticoagulation clinic provider.  31 tablet  2   No current facility-administered medications for this visit.    Physical Examination  Filed Vitals:   10/14/12 1507  BP: 145/85  Pulse: 86  Resp:     Body mass index is 20.19 kg/(m^2).  General:  WDWN in NAD Gait: Normal HEENT: WNL Eyes: Pupils equal Pulmonary: normal non-labored breathing , without Rales, rhonchi,  wheezing Cardiac: RRR, without  Murmurs, rubs or gallops; Abdomen: soft, NT, no masses Skin: no rashes, ulcers noted  Vascular Exam Pulses: 3+ radial pulses bilaterally Carotid bruits: Carotid pulses to auscultation bilaterally Extremities without ischemic changes, no Gangrene , no cellulitis; no open wounds;   Musculoskeletal: no muscle wasting or atrophy   Neurologic: A&O X 3; Appropriate Affect ; SENSATION: normal; MOTOR FUNCTION:  moving all extremities equally. Speech is fluent/normal  Non-Invasive Vascular Imaging CAROTID DUPLEX 10/14/2012  Right ICA 79-80 % stenosis Left ICA 20 - 39 % stenosis   ASSESSMENT/PLAN: Asymptomatic patient with a history of right-sided stroke with residual left sided weakness. The patient will followup in one year with repeat carotid duplex. The patient understands his right ICA is inoperable due to intracranial disease and stroke risk. The patient's questions were encouraged and answered, he is in agreement with this plan.  Lauree Chandler ANP   Clinic MD: Edilia Bo

## 2012-10-19 NOTE — Addendum Note (Signed)
Addended by: MCCHESNEY, MARILYN K on: 10/19/2012 09:15 AM   Modules accepted: Orders  

## 2012-10-27 ENCOUNTER — Other Ambulatory Visit: Payer: Self-pay | Admitting: Internal Medicine

## 2012-10-28 NOTE — Addendum Note (Signed)
Addended by: Bufford Spikes on: 10/28/2012 09:49 AM   Modules accepted: Orders

## 2012-11-09 ENCOUNTER — Ambulatory Visit (INDEPENDENT_AMBULATORY_CARE_PROVIDER_SITE_OTHER): Payer: PRIVATE HEALTH INSURANCE | Admitting: Pharmacist

## 2012-11-09 DIAGNOSIS — I6789 Other cerebrovascular disease: Secondary | ICD-10-CM

## 2012-11-09 DIAGNOSIS — Z7901 Long term (current) use of anticoagulants: Secondary | ICD-10-CM

## 2012-11-09 LAB — POCT INR: INR: 2.1

## 2012-11-09 NOTE — Patient Instructions (Signed)
Patient instructed to take medications as defined in the Anti-coagulation Track section of this encounter.  Patient instructed to take today's dose.  Patient verbalized understanding of these instructions.    

## 2012-11-09 NOTE — Progress Notes (Signed)
Lifelong anticoagulation per chart review.  INR at target.  Agree with Dr. Saralyn Pilar assessment and plan as documented.

## 2012-11-09 NOTE — Progress Notes (Signed)
Anti-Coagulation Progress Note  Robert Newton is a 67 y.o. male who is currently on an anti-coagulation regimen.    RECENT RESULTS: Recent results are below, the most recent result is correlated with a dose of 18 mg. per week: Lab Results  Component Value Date   INR 2.10 11/09/2012   INR 2.70 10/12/2012   INR 3.2 09/14/2012    ANTI-COAG DOSE: Anticoagulation Dose Instructions as of 11/09/2012     [name] Tue Wed Thu Fri Sat   New Dose 3 mg 3 mg 1.5 mg 3 mg 3 mg 1.5 mg 3 mg       ANTICOAG SUMMARY: Anticoagulation Episode Summary   Current INR goal 2.0-3.0  Next INR check 12/07/2012  INR from last check 2.10 (11/09/2012)  Weekly max dose   Target end date Indefinite  [facility]  Preferred lab   Send INR reminders to ANTICOAG IMP   Indications  CEREBROVASCULAR ACCIDENT ACUTE [436] Long term current use of anticoagulant [V58.61]        Comments       Anticoagulation Care Providers   Provider Role Specialty Phone number   Burns Spain, MD  Internal Medicine [phone]      ANTICOAG TODAY: Anticoagulation Summary as of 11/09/2012   INR goal 2.0-3.0  Selected INR 2.10 (11/09/2012)  Next INR check 12/07/2012  Target end date Indefinite   Indications  CEREBROVASCULAR ACCIDENT ACUTE [436] Long term current use of anticoagulant [V58.61]      Anticoagulation Episode Summary   [facility] check location [facility]   Preferred lab    Send INR reminders to ANTICOAG IMP   Comments     Anticoagulation Care Providers   Provider Role Specialty Phone number   Burns Spain, MD  Internal Medicine [phone]      PATIENT INSTRUCTIONS: Patient Instructions  Patient instructed to take medications as defined in the Anti-coagulation Track section of this encounter.  Patient instructed to take today's dose.  Patient verbalized understanding of these instructions.       FOLLOW-UP Return in 4 weeks (on 12/07/2012) for Follow up INR at  0845h.  Hulen Luster, III Pharm.D., CACP

## 2012-12-07 ENCOUNTER — Ambulatory Visit (INDEPENDENT_AMBULATORY_CARE_PROVIDER_SITE_OTHER): Payer: PRIVATE HEALTH INSURANCE | Admitting: Pharmacist

## 2012-12-07 DIAGNOSIS — Z7901 Long term (current) use of anticoagulants: Secondary | ICD-10-CM

## 2012-12-07 DIAGNOSIS — I6789 Other cerebrovascular disease: Secondary | ICD-10-CM

## 2012-12-07 NOTE — Progress Notes (Signed)
Anti-Coagulation Progress Note  Robert Newton is a 67 y.o. male who is currently on an anti-coagulation regimen.    RECENT RESULTS: Recent results are below, the most recent result is correlated with a dose of 18 mg. per week: Lab Results  Component Value Date   INR 2.40 12/07/2012   INR 2.10 11/09/2012   INR 2.70 10/12/2012    ANTI-COAG DOSE: Anticoagulation Dose Instructions as of 12/07/2012     [name] Tue Wed Thu Fri Sat   New Dose 3 mg 3 mg 1.5 mg 3 mg 3 mg 1.5 mg 3 mg       ANTICOAG SUMMARY: Anticoagulation Episode Summary   Current INR goal 2.0-3.0  Next INR check 01/11/2013  INR from last check 2.40 (12/07/2012)  Weekly max dose   Target end date Indefinite  [facility]  Preferred lab   Send INR reminders to ANTICOAG IMP   Indications  CEREBROVASCULAR ACCIDENT ACUTE [436] Long term current use of anticoagulant [V58.61]        Comments       Anticoagulation Care Providers   Provider Role Specialty Phone number   Burns Spain, MD  Internal Medicine [phone]      ANTICOAG TODAY: Anticoagulation Summary as of 12/07/2012   INR goal 2.0-3.0  Selected INR 2.40 (12/07/2012)  Next INR check 01/11/2013  Target end date Indefinite   Indications  CEREBROVASCULAR ACCIDENT ACUTE [436] Long term current use of anticoagulant [V58.61]      Anticoagulation Episode Summary   [facility] check [facility]   Preferred lab    Send INR reminders to ANTICOAG IMP   Comments     Anticoagulation Care Providers   Provider Role Specialty Phone number   Burns Spain, MD  Internal Medicine [phone]      PATIENT INSTRUCTIONS: Patient Instructions  Patient instructed to take medications as defined in the Anti-coagulation Track section of this encounter.  Patient instructed to take today's dose.  Patient verbalized understanding of these instructions.       FOLLOW-UP Return in 5 weeks (on 01/11/2013) for Follow up INR at  0845h.  Hulen Luster, III Pharm.D., CACP

## 2012-12-07 NOTE — Patient Instructions (Signed)
Patient instructed to take medications as defined in the Anti-coagulation Track section of this encounter.  Patient instructed to take today's dose.  Patient verbalized understanding of these instructions.    

## 2012-12-07 NOTE — Progress Notes (Signed)
Indication: Carotid stenosis.  Duration: Life-long per Neurosurgery preference.  INR at Target:  Agree with Dr. Saralyn Pilar assessment and plan as documented.

## 2013-01-11 ENCOUNTER — Ambulatory Visit (INDEPENDENT_AMBULATORY_CARE_PROVIDER_SITE_OTHER): Payer: PRIVATE HEALTH INSURANCE | Admitting: Pharmacist

## 2013-01-11 DIAGNOSIS — Z7901 Long term (current) use of anticoagulants: Secondary | ICD-10-CM

## 2013-01-11 DIAGNOSIS — I6789 Other cerebrovascular disease: Secondary | ICD-10-CM

## 2013-01-11 LAB — POCT INR: INR: 2.9

## 2013-01-11 NOTE — Patient Instructions (Signed)
Patient instructed to take medications as defined in the Anti-coagulation Track section of this encounter.  Patient instructed to take today's dose.  Patient verbalized understanding of these instructions.    

## 2013-01-11 NOTE — Progress Notes (Signed)
Anti-Coagulation Progress Note  Donavan Kerlin is a 67 y.o. male who is currently on an anti-coagulation regimen.    RECENT RESULTS: Recent results are below, the most recent result is correlated with a dose of 18 mg. per week: Lab Results  Component Value Date   INR 2.90 01/11/2013   INR 2.40 12/07/2012   INR 2.10 11/09/2012    ANTI-COAG DOSE: Anticoagulation Dose Instructions as of 01/11/2013     [name] Tue Wed Thu Fri Sat   New Dose 3 mg 3 mg 1.5 mg 3 mg 3 mg 1.5 mg 3 mg       ANTICOAG SUMMARY: Anticoagulation Episode Summary   Current INR goal 2.0-3.0  Next INR check 02/08/2013  INR from last check 2.90 (01/11/2013)  Weekly max dose   Target end date Indefinite  [facility]  Preferred lab   Send INR reminders to ANTICOAG IMP   Indications  CEREBROVASCULAR ACCIDENT ACUTE [436] Long term current use of anticoagulant [V58.61]        Comments       Anticoagulation Care Providers   Provider Role Specialty Phone number   Burns Spain, MD  Internal Medicine [phone]      ANTICOAG TODAY: Anticoagulation Summary as of 01/11/2013   INR goal 2.0-3.0  Selected INR 2.90 (01/11/2013)  Next INR check 02/08/2013  Target end date Indefinite   Indications  CEREBROVASCULAR ACCIDENT ACUTE [436] Long term current use of anticoagulant [V58.61]      Anticoagulation Episode Summary   [facility] check [facility]   Preferred lab    Send INR reminders to ANTICOAG IMP   Comments     Anticoagulation Care Providers   Provider Role Specialty Phone number   Burns Spain, MD  Internal Medicine [phone]      PATIENT INSTRUCTIONS: Patient Instructions  Patient instructed to take medications as defined in the Anti-coagulation Track section of this encounter.  Patient instructed to take today's dose.  Patient verbalized understanding of these instructions.       FOLLOW-UP Return in 4 weeks (on 02/08/2013) for Follow up INR at  0830h.  Hulen Luster, III Pharm.D., CACP

## 2013-02-08 ENCOUNTER — Ambulatory Visit (INDEPENDENT_AMBULATORY_CARE_PROVIDER_SITE_OTHER): Payer: PRIVATE HEALTH INSURANCE | Admitting: Pharmacist

## 2013-02-08 DIAGNOSIS — Z7901 Long term (current) use of anticoagulants: Secondary | ICD-10-CM

## 2013-02-08 DIAGNOSIS — I6789 Other cerebrovascular disease: Secondary | ICD-10-CM

## 2013-02-08 LAB — POCT INR: INR: 3

## 2013-02-08 NOTE — Patient Instructions (Signed)
Patient instructed to take medications as defined in the Anti-coagulation Track section of this encounter.  Patient instructed to take today's dose.  Patient verbalized understanding of these instructions.    

## 2013-02-08 NOTE — Progress Notes (Signed)
Anti-Coagulation Progress Note  Robert Newton is a 67 y.o. male who is currently on an anti-coagulation regimen.    RECENT RESULTS: Recent results are below, the most recent result is correlated with a dose of 18 mg. per week: Lab Results  Component Value Date   INR 3.0 02/08/2013   INR 2.90 01/11/2013   INR 2.40 12/07/2012    ANTI-COAG DOSE: Anticoagulation Dose Instructions as of 02/08/2013     [name] Tue Wed Thu Fri Sat   New Dose 3 mg 3 mg 1.5 mg 3 mg 1.5 mg 3 mg 1.5 mg       ANTICOAG SUMMARY: Anticoagulation Episode Summary   Current INR goal 2.0-3.0  Next INR check 03/08/2013  INR from last check 3.0 (02/08/2013)  Weekly max dose   Target end date Indefinite  [facility]  Preferred lab   Send INR reminders to ANTICOAG IMP   Indications  CEREBROVASCULAR ACCIDENT ACUTE [436] Long term current use of anticoagulant [V58.61]        Comments       Anticoagulation Care Providers   Provider Role Specialty Phone number   Burns Spain, MD  Internal Medicine [phone]      ANTICOAG TODAY: Anticoagulation Summary as of 02/08/2013   INR goal 2.0-3.0  Selected INR 3.0 (02/08/2013)  Next INR check 03/08/2013  Target end date Indefinite   Indications  CEREBROVASCULAR ACCIDENT ACUTE [436] Long term current use of anticoagulant [V58.61]      Anticoagulation Episode Summary   [facility] check location [facility]   Preferred lab    Send INR reminders to ANTICOAG IMP   Comments     Anticoagulation Care Providers   Provider Role Specialty Phone number   Burns Spain, MD  Internal Medicine [phone]      PATIENT INSTRUCTIONS: Patient Instructions  Patient instructed to take medications as defined in the Anti-coagulation Track section of this encounter.  Patient instructed to take today's dose.  Patient verbalized understanding of these instructions.       FOLLOW-UP Return in 4 weeks (on 03/08/2013) for Follow up INR at  0845h.  Hulen Luster, III Pharm.D., CACP

## 2013-02-16 ENCOUNTER — Encounter: Payer: Self-pay | Admitting: Internal Medicine

## 2013-03-08 ENCOUNTER — Encounter: Payer: Self-pay | Admitting: Internal Medicine

## 2013-03-08 ENCOUNTER — Ambulatory Visit: Payer: PRIVATE HEALTH INSURANCE

## 2013-03-08 ENCOUNTER — Ambulatory Visit (INDEPENDENT_AMBULATORY_CARE_PROVIDER_SITE_OTHER): Payer: PRIVATE HEALTH INSURANCE | Admitting: Internal Medicine

## 2013-03-08 ENCOUNTER — Ambulatory Visit (INDEPENDENT_AMBULATORY_CARE_PROVIDER_SITE_OTHER): Payer: PRIVATE HEALTH INSURANCE | Admitting: Pharmacist

## 2013-03-08 VITALS — BP 114/75 | HR 83 | Temp 97.5°F | Ht 73.0 in | Wt 139.7 lb

## 2013-03-08 DIAGNOSIS — I6789 Other cerebrovascular disease: Secondary | ICD-10-CM

## 2013-03-08 DIAGNOSIS — I10 Essential (primary) hypertension: Secondary | ICD-10-CM

## 2013-03-08 DIAGNOSIS — F17200 Nicotine dependence, unspecified, uncomplicated: Secondary | ICD-10-CM

## 2013-03-08 DIAGNOSIS — Z720 Tobacco use: Secondary | ICD-10-CM

## 2013-03-08 DIAGNOSIS — Z7901 Long term (current) use of anticoagulants: Secondary | ICD-10-CM

## 2013-03-08 DIAGNOSIS — E785 Hyperlipidemia, unspecified: Secondary | ICD-10-CM

## 2013-03-08 LAB — BASIC METABOLIC PANEL WITH GFR
Calcium: 9.1 mg/dL (ref 8.4–10.5)
GFR, Est African American: >89 mL/min
Sodium: 136 mEq/L (ref 135–145)

## 2013-03-08 LAB — LIPID PANEL
Cholesterol: 102 mg/dL (ref 0–200)
Triglycerides: 32 mg/dL (ref <150)

## 2013-03-08 MED ORDER — WARFARIN SODIUM 3 MG PO TABS: ORAL_TABLET | ORAL | Status: DC

## 2013-03-08 MED ORDER — OMEPRAZOLE 20 MG PO CPDR: DELAYED_RELEASE_CAPSULE | ORAL | Status: DC

## 2013-03-08 NOTE — Assessment & Plan Note (Signed)
BP Readings from Last 3 Encounters:  03/08/13 114/75  10/14/12 145/85  10/05/12 116/77    Lab Results  Component Value Date   NA 139 01/29/2012   K 4.1 01/29/2012   CREATININE 0.85 01/29/2012    Assessment: Blood pressure control: controlled Progress toward BP goal:  at goal Comments: none  Plan: Medications:  not on medication Educational resources provided: brochure;handout Self management tools provided: other (see comments) Other plans: BMET, lipid panel today

## 2013-03-08 NOTE — Patient Instructions (Addendum)
Patient instructed to take medications as defined in the Anti-coagulation Track section of this encounter.  Patient instructed to take today's dose.  Patient verbalized understanding of these instructions.    

## 2013-03-08 NOTE — Progress Notes (Signed)
  Subjective:    Patient ID: Robert Newton, male    DOB: 1946/03/23, 67 y.o.   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  He denies complaints today but needs a Rx refill Coumadin.  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  His wife is in a assisted living facility x 4 months due to having a left BKA FH: No new changes.       Review of Systems  Respiratory: Negative for shortness of breath.   Cardiovascular: Negative for chest pain.  Gastrointestinal: Negative for abdominal pain and blood in stool.  Genitourinary: Negative for hematuria and difficulty urinating.       Objective:   Physical Exam  Nursing note and vitals reviewed. Constitutional: He is oriented to person, place, and time. Vital signs are normal. He appears well-developed and well-nourished. He is cooperative.  HENT:  Head: Normocephalic and atraumatic.  Mouth/Throat: Oropharynx is clear and moist and mucous membranes are normal. No oropharyngeal exudate.  Eyes: Conjunctivae are normal. Pupils are equal, round, and reactive to light. Right eye exhibits no discharge. Left eye exhibits no discharge. No scleral icterus.  Cardiovascular: Normal rate, regular rhythm, S1 normal, S2 normal and normal heart sounds.   No murmur heard. Pulmonary/Chest: Effort normal and breath sounds normal.  Abdominal: Soft. Bowel sounds are normal. He exhibits no distension. There is no tenderness.  Musculoskeletal: He exhibits no edema.  Neurological: He is alert and oriented to person, place, and time. No cranial nerve deficit.  Motor strength at baseline has residual left hemiparesis due to remote stroke walks with cane  Skin:  Skin is warm, dry and intact. No rash noted.  Psychiatric: He has a normal mood and affect. His speech is normal and behavior is normal. Judgment and thought content normal. Cognition and memory are normal.          Assessment & Plan:  F/u in 4-6 months

## 2013-03-08 NOTE — Assessment & Plan Note (Signed)
Lipid Panel     Component Value Date/Time   CHOL 115 01/29/2012 0900   TRIG 37 01/29/2012 0900   HDL 36* 01/29/2012 0900   CHOLHDL 3.2 01/29/2012 0900   VLDL 7 01/29/2012 0900   LDLCALC 72 01/29/2012 0900   Continue Lipitor, recheck panel today

## 2013-03-08 NOTE — Assessment & Plan Note (Signed)
Will meet with Dr. Alexandria Lodge today, INR check  Rx refill Coumadin 1.5 T, Th, Sat other days 3 mg

## 2013-03-08 NOTE — Progress Notes (Signed)
Case discussed with Dr. McLean at the time of the visit.  We reviewed the resident's history and exam and pertinent patient test results.  I agree with the assessment, diagnosis, and plan of care documented in the resident's note.     

## 2013-03-08 NOTE — Progress Notes (Signed)
Anti-Coagulation Progress Note  Robert Newton is a 67 y.o. male who is currently on an anti-coagulation regimen.    RECENT RESULTS: Recent results are below, the most recent result is correlated with a dose of 16.5 mg. per week: Lab Results  Component Value Date   INR 1.90 03/08/2013   INR 1.90 03/08/2013   INR 3.0 02/08/2013    ANTI-COAG DOSE: Anticoagulation Dose Instructions as of 03/08/2013     [name] Tue Wed Thu Fri Sat   New Dose 3 mg 3 mg 3 mg 3 mg 1.5 mg 3 mg 1.5 mg       ANTICOAG SUMMARY: Anticoagulation Episode Summary   Current INR goal 2.0-3.0  Next INR check 04/05/2013  INR from last check 1.90! (03/08/2013)  Weekly max dose   Target end date Indefinite  [facility]  Preferred lab   Send INR reminders to ANTICOAG IMP   Indications  CEREBROVASCULAR ACCIDENT ACUTE [436] Long term current use of anticoagulant [V58.61]        Comments       Anticoagulation Care Providers   Provider Role Specialty Phone number   Burns Spain, MD  Internal Medicine [phone]      ANTICOAG TODAY: Anticoagulation Summary as of 03/08/2013   INR goal 2.0-3.0  Selected INR 1.90! (03/08/2013)  Next INR check 04/05/2013  Target end date Indefinite   Indications  CEREBROVASCULAR ACCIDENT ACUTE [436] Long term current use of anticoagulant [V58.61]      Anticoagulation Episode Summary   [facility] check location [facility]   Preferred lab    Send INR reminders to ANTICOAG IMP   Comments     Anticoagulation Care Providers   Provider Role Specialty Phone number   Burns Spain, MD  Internal Medicine [phone]      PATIENT INSTRUCTIONS: Patient Instructions  Patient instructed to take medications as defined in the Anti-coagulation Track section of this encounter.  Patient instructed to take today's dose.  Patient verbalized understanding of these instructions.       FOLLOW-UP Return in 4 weeks (on 04/05/2013) for Follow up INR at  0830h.  Hulen Luster, III Pharm.D., CACP

## 2013-03-08 NOTE — Patient Instructions (Addendum)
General Instructions: Try to stop smoking  Read the information below. Pick up your medication from the pharmacy I will send you a copy of your labs in the mail  Pleasure meeting you Follow up in 4-6 months    Smoking Cessation Quitting smoking is important to your health and has many advantages. However, it is not always easy to quit since nicotine is a very addictive drug. Often times, people try 3 times or more before being able to quit. This document explains the best ways for you to prepare to quit smoking. Quitting takes hard work and a lot of effort, but you can do it. ADVANTAGES OF QUITTING SMOKING  You will live longer, feel better, and live better.  Your body will feel the impact of quitting smoking almost immediately.  Within 20 minutes, blood pressure decreases. Your pulse returns to its normal level.  After 8 hours, carbon monoxide levels in the blood return to normal. Your oxygen level increases.  After 24 hours, the chance of having a heart attack starts to decrease. Your breath, hair, and body stop smelling like smoke.  After 48 hours, damaged nerve endings begin to recover. Your sense of taste and smell improve.  After 72 hours, the body is virtually free of nicotine. Your bronchial tubes relax and breathing becomes easier.  After 2 to 12 weeks, lungs can hold more air. Exercise becomes easier and circulation improves.  The risk of having a heart attack, stroke, cancer, or lung disease is greatly reduced.  After 1 year, the risk of coronary heart disease is cut in half.  After 5 years, the risk of stroke falls to the same as a nonsmoker.  After 10 years, the risk of lung cancer is cut in half and the risk of other cancers decreases significantly.  After 15 years, the risk of coronary heart disease drops, usually to the level of a nonsmoker.  If you are pregnant, quitting smoking will improve your chances of having a healthy baby.  The people you live with,  especially any children, will be healthier.  You will have extra money to spend on things other than cigarettes. QUESTIONS TO THINK ABOUT BEFORE ATTEMPTING TO QUIT You may want to talk about your answers with your caregiver.  Why do you want to quit?  If you tried to quit in the past, what helped and what did not?  What will be the most difficult situations for you after you quit? How will you plan to handle them?  Who can help you through the tough times? Your family? Friends? A caregiver?  What pleasures do you get from smoking? What ways can you still get pleasure if you quit? Here are some questions to ask your caregiver:  How can you help me to be successful at quitting?  What medicine do you think would be best for me and how should I take it?  What should I do if I need more help?  What is smoking withdrawal like? How can I get information on withdrawal? GET READY  Set a quit date.  Change your environment by getting rid of all cigarettes, ashtrays, matches, and lighters in your home, car, or work. Do not let people smoke in your home.  Review your past attempts to quit. Think about what worked and what did not. GET SUPPORT AND ENCOURAGEMENT You have a better chance of being successful if you have help. You can get support in many ways.  Tell your family, friends, and  co-workers that you are going to quit and need their support. Ask them not to smoke around you.  Get individual, group, or telephone counseling and support. Programs are available at [employer] and health centers. Call your local health department for information about programs in your area.  Spiritual beliefs and practices may help some smokers quit.  Download a "quit meter" on your computer to keep track of quit statistics, such as how long you have gone without smoking, cigarettes not smoked, and money saved.  Get a self-help book about quitting smoking and staying off of tobacco. LEARN NEW  SKILLS AND BEHAVIORS  Distract yourself from urges to smoke. Talk to someone, go for a walk, or occupy your time with a task.  Change your normal routine. Take a different route to work. Drink tea instead of coffee. Eat breakfast in a different place.  Reduce your stress. Take a hot bath, exercise, or read a book.  Plan something enjoyable to do every day. Reward yourself for not smoking.  Explore interactive web-based programs that specialize in helping you quit. GET MEDICINE AND USE IT CORRECTLY Medicines can help you stop smoking and decrease the urge to smoke. Combining medicine with the above behavioral methods and support can greatly increase your chances of successfully quitting smoking.  Nicotine replacement therapy helps deliver nicotine to your body without the negative effects and risks of smoking. Nicotine replacement therapy includes nicotine gum, lozenges, inhalers, nasal sprays, and skin patches. Some may be available over-the-counter and others require a prescription.  Antidepressant medicine helps people abstain from smoking, but how this works is unknown. This medicine is available by prescription.  Nicotinic receptor partial agonist medicine simulates the effect of nicotine in your brain. This medicine is available by prescription. Ask your caregiver for advice about which medicines to use and how to use them based on your health history. Your caregiver will tell you what side effects to look out for if you choose to be on a medicine or therapy. Carefully read the information on the package. Do not use any other product containing nicotine while using a nicotine replacement product.  RELAPSE OR DIFFICULT SITUATIONS Most relapses occur within the first 3 months after quitting. Do not be discouraged if you start smoking again. Remember, most people try several times before finally quitting. You may have symptoms of withdrawal because your body is used to nicotine. You may crave  cigarettes, be irritable, feel very hungry, cough often, get headaches, or have difficulty concentrating. The withdrawal symptoms are only temporary. They are strongest when you first quit, but they will go away within 10 14 days. To reduce the chances of relapse, try to:  Avoid drinking alcohol. Drinking lowers your chances of successfully quitting.  Reduce the amount of caffeine you consume. Once you quit smoking, the amount of caffeine in your body increases and can give you symptoms, such as a rapid heartbeat, sweating, and anxiety.  Avoid smokers because they can make you want to smoke.  Do not let weight gain distract you. Many smokers will gain weight when they quit, usually less than 10 pounds. Eat a healthy diet and stay active. You can always lose the weight gained after you quit.  Find ways to improve your mood other than smoking. FOR MORE INFORMATION  www.smokefree.gov  Document Released: 07/23/2001 Document Revised: 01/28/2012 Document Reviewed: 11/07/2011 [facility] Patient Information 2014 [place], [place].   Hypertension As your heart beats, it forces blood through your arteries. This force is  your blood pressure. If the pressure is too high, it is called hypertension (HTN) or high blood pressure. HTN is dangerous because you may have it and not know it. High blood pressure may mean that your heart has to work harder to pump blood. Your arteries may be narrow or stiff. The extra work puts you at risk for heart disease, stroke, and other problems.  Blood pressure consists of two numbers, a higher number over a lower, 110/72, for example. It is stated as "110 over 72." The ideal is below 120 for the top number (systolic) and under 80 for the bottom (diastolic). Write down your blood pressure today. You should pay close attention to your blood pressure if you have certain conditions such as:  Heart failure.  Prior heart attack.  Diabetes  Chronic kidney disease.  Prior  stroke.  Multiple risk factors for heart disease. To see if you have HTN, your blood pressure should be measured while you are seated with your arm held at the level of the heart. It should be measured at least twice. A one-time elevated blood pressure reading (especially in the Emergency Department) does not mean that you need treatment. There may be conditions in which the blood pressure is different between your right and left arms. It is important to see your caregiver soon for a recheck. Most people have essential hypertension which means that there is not a specific cause. This type of high blood pressure may be lowered by changing lifestyle factors such as:  Stress.  Smoking.  Lack of exercise.  Excessive weight.  Drug/tobacco/alcohol use.  Eating less salt. Most people do not have symptoms from high blood pressure until it has caused damage to the body. Effective treatment can often prevent, delay or reduce that damage. TREATMENT  When a cause has been identified, treatment for high blood pressure is directed at the cause. There are a large number of medications to treat HTN. These fall into several categories, and your caregiver will help you select the medicines that are best for you. Medications may have side effects. You should review side effects with your caregiver. If your blood pressure stays high after you have made lifestyle changes or started on medicines,   Your medication(s) may need to be changed.  Other problems may need to be addressed.  Be certain you understand your prescriptions, and know how and when to take your medicine.  Be sure to follow up with your caregiver within the time frame advised (usually within two weeks) to have your blood pressure rechecked and to review your medications.  If you are taking more than one medicine to lower your blood pressure, make sure you know how and at what times they should be taken. Taking two medicines at the same time  can result in blood pressure that is too low. SEEK IMMEDIATE MEDICAL CARE IF:  You develop a severe headache, blurred or changing vision, or confusion.  You have unusual weakness or numbness, or a faint feeling.  You have severe chest or abdominal pain, vomiting, or breathing problems. MAKE SURE YOU:   Understand these instructions.  Will watch your condition.  Will get help right away if you are not doing well or get worse. Document Released: 07/29/2005 Document Revised: 10/21/2011 Document Reviewed: 03/18/2008 [facility] Patient Information 2014 [place], [place].   Treatment Goals:  Goals (1 Years of Data) as of 03/08/13   None      Progress Toward Treatment Goals:  Treatment Goal 03/08/2013  Blood pressure at goal  Stop smoking smoking less    Self Care Goals & Plans:  Self Care Goal 03/08/2013  Manage my medications take my medicines as prescribed; bring my medications to every visit; refill my medications on time; follow the sick day instructions if I am sick  Monitor my health keep track of my blood pressure  Eat healthy foods eat more vegetables; eat fruit for snacks and desserts; eat smaller portions; eat foods that are low in salt; eat baked foods instead of fried foods; drink diet soda or water instead of juice or soda  Be physically active find an activity I enjoy  Meeting treatment goals maintain the current self-care plan       Care Management & Community Referrals:  Referral 03/08/2013  Referrals made for care management support none needed  Referrals made to community resources none

## 2013-03-08 NOTE — Assessment & Plan Note (Signed)
  Assessment: Progress toward smoking cessation:  smoking less Barriers to progress toward smoking cessation:  none Comments: does not seem ready to quit though encouraged   Plan: Instruction/counseling given:  I counseled patient on the dangers of tobacco use, advised patient to stop smoking, and reviewed strategies to maximize success. Educational resources provided:  [employer]) brochure Self management tools provided:  other (see comments) Medications to assist with smoking cessation:  None Patient agreed to the following self-care plans for smoking cessation: N/a Other plans: handout

## 2013-03-09 ENCOUNTER — Encounter: Payer: Self-pay | Admitting: Internal Medicine

## 2013-04-05 ENCOUNTER — Ambulatory Visit (INDEPENDENT_AMBULATORY_CARE_PROVIDER_SITE_OTHER): Payer: PRIVATE HEALTH INSURANCE | Admitting: Pharmacist

## 2013-04-05 DIAGNOSIS — Z7901 Long term (current) use of anticoagulants: Secondary | ICD-10-CM

## 2013-04-05 DIAGNOSIS — I6789 Other cerebrovascular disease: Secondary | ICD-10-CM

## 2013-04-05 LAB — POCT INR: INR: 2

## 2013-04-05 NOTE — Patient Instructions (Signed)
Patient instructed to take medications as defined in the Anti-coagulation Track section of this encounter.  Patient instructed to take today's dose.  Patient verbalized understanding of these instructions.    

## 2013-04-05 NOTE — Progress Notes (Signed)
Anti-Coagulation Progress Note  Robert Newton is a 67 y.o. male who is currently on an anti-coagulation regimen.    RECENT RESULTS: Recent results are below, the most recent result is correlated with a dose of 18 mg. per week: Lab Results  Component Value Date   INR 2.00 04/05/2013   INR 1.90 03/08/2013   INR 1.90 03/08/2013    ANTI-COAG DOSE: Anticoagulation Dose Instructions as of 04/05/2013     [name] Tue Wed Thu Fri Sat   New Dose 3 mg 3 mg 3 mg 3 mg 3 mg 3 mg 3 mg       ANTICOAG SUMMARY: Anticoagulation Episode Summary   Current INR goal 2.0-3.0  Next INR check 05/03/2013  INR from last check 2.00 (04/05/2013)  Weekly max dose   Target end date Indefinite  [facility]  Preferred lab   Send INR reminders to ANTICOAG IMP   Indications  CEREBROVASCULAR ACCIDENT ACUTE [436] Long term current use of anticoagulant [V58.61]        Comments       Anticoagulation Care Providers   Provider Role Specialty Phone number   Burns Spain, MD  Internal Medicine [phone]      ANTICOAG TODAY: Anticoagulation Summary as of 04/05/2013   INR goal 2.0-3.0  Selected INR 2.00 (04/05/2013)  Next INR check 05/03/2013  Target end date Indefinite   Indications  CEREBROVASCULAR ACCIDENT ACUTE [436] Long term current use of anticoagulant [V58.61]      Anticoagulation Episode Summary   [facility] check [facility]   Preferred lab    Send INR reminders to ANTICOAG IMP   Comments     Anticoagulation Care Providers   Provider Role Specialty Phone number   Burns Spain, MD  Internal Medicine [phone]      PATIENT INSTRUCTIONS: Patient Instructions  Patient instructed to take medications as defined in the Anti-coagulation Track section of this encounter.  Patient instructed to take today's dose.  Patient verbalized understanding of these instructions.       FOLLOW-UP Return in 4 weeks (on 05/03/2013) for Follow up INR at  0845h.  Hulen Luster, III Pharm.D., CACP

## 2013-05-03 ENCOUNTER — Ambulatory Visit (INDEPENDENT_AMBULATORY_CARE_PROVIDER_SITE_OTHER): Payer: PRIVATE HEALTH INSURANCE | Admitting: Pharmacist

## 2013-05-03 DIAGNOSIS — I6789 Other cerebrovascular disease: Secondary | ICD-10-CM

## 2013-05-03 DIAGNOSIS — Z7901 Long term (current) use of anticoagulants: Secondary | ICD-10-CM

## 2013-05-03 LAB — POCT INR: INR: 2.7

## 2013-05-03 NOTE — Progress Notes (Signed)
Anti-Coagulation Progress Note  Robert Newton is a 67 y.o. male who is currently on an anti-coagulation regimen.    RECENT RESULTS: Recent results are below, the most recent result is correlated with a dose of 21 mg. per week: Lab Results  Component Value Date   INR 2.70 05/03/2013   INR 2.00 04/05/2013   INR 1.90 03/08/2013    ANTI-COAG DOSE: Anticoagulation Dose Instructions as of 05/03/2013     [name] Tue Wed Thu Fri Sat   New Dose 3 mg 3 mg 3 mg 3 mg 3 mg 3 mg 3 mg       ANTICOAG SUMMARY: Anticoagulation Episode Summary   Current INR goal 2.0-3.0  Next INR check 05/31/2013  INR from last check 2.70 (05/03/2013)  Weekly max dose   Target end date Indefinite  [facility]  Preferred lab   Send INR reminders to ANTICOAG IMP   Indications  CEREBROVASCULAR ACCIDENT ACUTE [436] Long term current use of anticoagulant [V58.61]        Comments       Anticoagulation Care Providers   Provider Role Specialty Phone number   Burns Spain, MD  Internal Medicine [phone]      ANTICOAG TODAY: Anticoagulation Summary as of 05/03/2013   INR goal 2.0-3.0  Selected INR 2.70 (05/03/2013)  Next INR check 05/31/2013  Target end date Indefinite   Indications  CEREBROVASCULAR ACCIDENT ACUTE [436] Long term current use of anticoagulant [V58.61]      Anticoagulation Episode Summary   [facility] check location [facility]   Preferred lab    Send INR reminders to ANTICOAG IMP   Comments     Anticoagulation Care Providers   Provider Role Specialty Phone number   Burns Spain, MD  Internal Medicine [phone]      PATIENT INSTRUCTIONS: Patient Instructions  Patient instructed to take medications as defined in the Anti-coagulation Track section of this encounter.  Patient instructed to take today's dose.  Patient verbalized understanding of these instructions.       FOLLOW-UP Return in 4 weeks (on 05/31/2013) for Follow up INR at  0900h.  Hulen Luster, III Pharm.D., CACP

## 2013-05-03 NOTE — Patient Instructions (Signed)
Patient instructed to take medications as defined in the Anti-coagulation Track section of this encounter.  Patient instructed to take today's dose.  Patient verbalized understanding of these instructions.    

## 2013-05-24 ENCOUNTER — Ambulatory Visit (INDEPENDENT_AMBULATORY_CARE_PROVIDER_SITE_OTHER): Payer: PRIVATE HEALTH INSURANCE | Admitting: Pharmacist

## 2013-05-24 DIAGNOSIS — Z7901 Long term (current) use of anticoagulants: Secondary | ICD-10-CM

## 2013-05-24 DIAGNOSIS — I6789 Other cerebrovascular disease: Secondary | ICD-10-CM

## 2013-05-24 LAB — POCT INR: INR: 3.6

## 2013-05-24 NOTE — Progress Notes (Signed)
Indication: Carotid stenosis, Neurosurgery's recommendation. Duration: Indefinite. INR: Above target. Agree with Dr. Groce's assessment and plan 

## 2013-05-24 NOTE — Patient Instructions (Signed)
Patient instructed to take medications as defined in the Anti-coagulation Track section of this encounter.  Patient instructed to take today's dose.  Patient verbalized understanding of these instructions.    

## 2013-05-24 NOTE — Progress Notes (Signed)
Anti-Coagulation Progress Note  Robert Newton is a 67 y.o. male who is currently on an anti-coagulation regimen.    RECENT RESULTS: Recent results are below, the most recent result is correlated with a dose of 21 mg. per week: Lab Results  Component Value Date   INR 3.60 05/24/2013   INR 2.70 05/03/2013   INR 2.00 04/05/2013    ANTI-COAG DOSE: Anticoagulation Dose Instructions as of 05/24/2013     [name] Tue Wed Thu Fri Sat   New Dose 3 mg 3 mg 1.5 mg 3 mg 3 mg 1.5 mg 3 mg       ANTICOAG SUMMARY: Anticoagulation Episode Summary   Current INR goal 2.0-3.0  Next INR check 06/21/2013  INR from last check 3.60! (05/24/2013)  Weekly max dose   Target end date Indefinite  [facility]  Preferred lab   Send INR reminders to ANTICOAG IMP   Indications  CEREBROVASCULAR ACCIDENT ACUTE [436] Long term current use of anticoagulant [V58.61]        Comments       Anticoagulation Care Providers   Provider Role Specialty Phone number   Burns Spain, MD  Internal Medicine [phone]      ANTICOAG TODAY: Anticoagulation Summary as of 05/24/2013   INR goal 2.0-3.0  Selected INR 3.60! (05/24/2013)  Next INR check 06/21/2013  Target end date Indefinite   Indications  CEREBROVASCULAR ACCIDENT ACUTE [436] Long term current use of anticoagulant [V58.61]      Anticoagulation Episode Summary   [facility] check location [facility]   Preferred lab    Send INR reminders to ANTICOAG IMP   Comments     Anticoagulation Care Providers   Provider Role Specialty Phone number   Burns Spain, MD  Internal Medicine [phone]      PATIENT INSTRUCTIONS: Patient Instructions  Patient instructed to take medications as defined in the Anti-coagulation Track section of this encounter.  Patient instructed to take today's dose.  Patient verbalized understanding of these instructions.       FOLLOW-UP Return in 4 weeks (on 06/21/2013) for Follow up  INR at 0830h.  Hulen Luster, III Pharm.D., CACP

## 2013-05-31 ENCOUNTER — Ambulatory Visit: Payer: PRIVATE HEALTH INSURANCE

## 2013-06-21 ENCOUNTER — Ambulatory Visit (INDEPENDENT_AMBULATORY_CARE_PROVIDER_SITE_OTHER): Payer: PRIVATE HEALTH INSURANCE | Admitting: Pharmacist

## 2013-06-21 DIAGNOSIS — Z7901 Long term (current) use of anticoagulants: Secondary | ICD-10-CM

## 2013-06-21 DIAGNOSIS — I6789 Other cerebrovascular disease: Secondary | ICD-10-CM

## 2013-06-21 NOTE — Progress Notes (Signed)
Anti-Coagulation Progress Note  Robert Newton is a 67 y.o. male who is currently on an anti-coagulation regimen.    RECENT RESULTS: Recent results are below, the most recent result is correlated with a dose of 18 mg. 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] Long term current use of anticoagulant [V58.61]        Comments       Anticoagulation Care Providers   Provider Role Specialty Phone number   Burns Spain, MD  Internal Medicine [phone]      ANTICOAG TODAY: Anticoagulation Summary as of 06/21/2013   INR goal 2.0-3.0  Selected INR 2.00 (06/21/2013)  Next INR check 07/12/2013  Target end date Indefinite   Indications  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  Patient instructed to take today's dose.  Patient verbalized understanding of these instructions.       FOLLOW-UP Return in 3 weeks (on 07/12/2013) for Follow up INR at  0830h.  Hulen Luster, III Pharm.D., CACP

## 2013-06-21 NOTE — Patient Instructions (Signed)
Patient instructed to take medications as defined in the Anti-coagulation Track section of this encounter.  Patient instructed to take today's dose.  Patient verbalized understanding of these instructions.    

## 2013-06-23 NOTE — Progress Notes (Signed)
Indication: Carotid stenosis, Neurosurgery's recommendation. Duration: Indefinite. INR: Above target. Agree with Dr. Saralyn Pilar assessment and plan

## 2013-07-12 ENCOUNTER — Ambulatory Visit (INDEPENDENT_AMBULATORY_CARE_PROVIDER_SITE_OTHER): Payer: PRIVATE HEALTH INSURANCE | Admitting: Pharmacist

## 2013-07-12 ENCOUNTER — Encounter: Payer: Self-pay | Admitting: Internal Medicine

## 2013-07-12 ENCOUNTER — Ambulatory Visit (INDEPENDENT_AMBULATORY_CARE_PROVIDER_SITE_OTHER): Payer: PRIVATE HEALTH INSURANCE | Admitting: Internal Medicine

## 2013-07-12 VITALS — BP 151/81 | HR 76 | Temp 97.3°F | Ht 73.0 in | Wt 142.0 lb

## 2013-07-12 DIAGNOSIS — M79609 Pain in unspecified limb: Secondary | ICD-10-CM

## 2013-07-12 DIAGNOSIS — Z7901 Long term (current) use of anticoagulants: Secondary | ICD-10-CM

## 2013-07-12 DIAGNOSIS — I6789 Other cerebrovascular disease: Secondary | ICD-10-CM

## 2013-07-12 DIAGNOSIS — M76892 Other specified enthesopathies of left lower limb, excluding foot: Secondary | ICD-10-CM

## 2013-07-12 DIAGNOSIS — M6580 Other synovitis and tenosynovitis, unspecified site: Secondary | ICD-10-CM

## 2013-07-12 LAB — POCT INR: INR: 2.7

## 2013-07-12 NOTE — Progress Notes (Signed)
Case discussed with Dr. Sadek soon after the resident saw the patient.  We reviewed the resident's history and exam and pertinent patient test results.  I agree with the assessment, diagnosis, and plan of care documented in the resident's note. 

## 2013-07-12 NOTE — Patient Instructions (Signed)
Knee Pain Knee pain can be a result of an injury or other medical conditions. Treatment will depend on the cause of your pain. HOME CARE  Only take medicine as told by your doctor.  Keep a healthy weight. Being overweight can make the knee hurt more.  Stretch before exercising or playing sports.  If there is constant knee pain, change the way you exercise. Ask your doctor for advice.  Make sure shoes fit well. Choose the right shoe for the sport or activity.  Protect your knees. Wear kneepads if needed.  Rest when you are tired. GET HELP RIGHT AWAY IF:   Your knee pain does not stop.  Your knee pain does not get better.  Your knee joint feels hot to the touch.  You have a fever. MAKE SURE YOU:   Understand these instructions.  Will watch this condition.  Will get help right away if you are not doing well or get worse. Document Released: 10/25/2008 Document Revised: 10/21/2011 Document Reviewed: 10/25/2008 [employer] Patient Information 2014 [employer].  

## 2013-07-12 NOTE — Progress Notes (Signed)
   Subjective:    Patient ID: Robert Newton, male    DOB: 09-03-1945, 67 y.o.   MRN: [id number]  Knee Pain  Pertinent negatives include no numbness.   Robert Newton 67 yo M pmh of chronic anticoagulation for carotid artery stenosis presents acutely for leg pain. Pt states has been having some left knee pain. Pt denies any trauma to the area, associated muscle weakness, numbness, decreased ambulation, shooting pain, back injury, clicking or locking of the knee. Symptoms improve with rest, ibuprophen (400mg  daily) and ambulation and has been going on for past week. He associates increased activity given his wife's recent amputation and and his pushing and pulling her wheelchair up a ramp at home.    Review of Systems  Constitutional: Negative for fever and fatigue.  Cardiovascular: Negative for chest pain and leg swelling.  Musculoskeletal: Negative for back pain, gait problem, joint swelling and myalgias.  Neurological: Negative for weakness and numbness.  All other systems reviewed and are negative.  problem list and medications were reviewed with the patient.      Objective:   Physical Exam  Constitutional: He appears well-developed and well-nourished. No distress.  Musculoskeletal:       Left knee: He exhibits normal range of motion, no swelling, no effusion, no ecchymosis, no deformity, no erythema, normal alignment, no LCL laxity, normal patellar mobility and no bony tenderness. No tenderness found.   Filed Vitals:   07/12/13 0858  Height: 6\' 1"  (1.854 m)  Weight: 142 lb (64.411 kg)      Assessment & Plan:  1. Leg pain 2/2 left knee tendonitis: pt has no warning symptoms and no concerning PE findings to warrant imaging at this time. Pt is achieving good relief with rest and ibuprophen and didn't require strong pain management. Pt was educated on tendonitis and symptomatic management. Given pt is on coumadin it is reassuring that no effusion or trauma to the area was noted making  hemarthrosis less likely.  -f/u if becomes worse or doesn't improve -pt education on warning symptoms and symptom management  Pt discussed with Dr. Aundria Rud.

## 2013-07-12 NOTE — Patient Instructions (Signed)
Patient instructed to take medications as defined in the Anti-coagulation Track section of this encounter.  Patient instructed to take today's dose.  Patient verbalized understanding of these instructions.    

## 2013-07-12 NOTE — Progress Notes (Signed)
Anti-Coagulation Progress Note  Robert Newton is a 67 y.o. male who is currently on an anti-coagulation regimen.    RECENT RESULTS: Recent results are below, the most recent result is correlated with a dose of 19.5 mg. 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] Long term current use of anticoagulant [V58.61]        Comments       Anticoagulation Care Providers   Provider Role Specialty Phone number   Burns Spain, MD  Internal Medicine [phone]      ANTICOAG TODAY: Anticoagulation Summary as of 07/12/2013   INR goal 2.0-3.0  Selected INR 2.70 (07/12/2013)  Next INR check 08/09/2013  Target end date Indefinite   Indications  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  Patient instructed to take today's dose.  Patient verbalized understanding of these instructions.       FOLLOW-UP Return in 4 weeks (on 08/09/2013) for Follow up INR at  0830h.  Hulen Luster, III Pharm.D., CACP

## 2013-07-20 ENCOUNTER — Other Ambulatory Visit: Payer: Self-pay | Admitting: *Deleted

## 2013-07-20 MED ORDER — OMEPRAZOLE 20 MG PO CPDR: DELAYED_RELEASE_CAPSULE | ORAL | Status: DC

## 2013-07-20 NOTE — Telephone Encounter (Signed)
Fax from [facility] requesting 90 day supply

## 2013-07-22 ENCOUNTER — Other Ambulatory Visit: Payer: Self-pay | Admitting: *Deleted

## 2013-07-22 MED ORDER — ATORVASTATIN CALCIUM 40 MG PO TABS: ORAL_TABLET | ORAL | Status: DC

## 2013-07-22 NOTE — Telephone Encounter (Signed)
Fax from [facility] - requesting 90 day supply  Thanks 

## 2013-08-09 ENCOUNTER — Ambulatory Visit (INDEPENDENT_AMBULATORY_CARE_PROVIDER_SITE_OTHER): Payer: PRIVATE HEALTH INSURANCE | Admitting: Pharmacist

## 2013-08-09 DIAGNOSIS — Z7901 Long term (current) use of anticoagulants: Secondary | ICD-10-CM

## 2013-08-09 DIAGNOSIS — I6789 Other cerebrovascular disease: Secondary | ICD-10-CM

## 2013-08-09 LAB — POCT INR: INR: 2

## 2013-08-09 NOTE — Patient Instructions (Signed)
Patient instructed to take medications as defined in the Anti-coagulation Track section of this encounter.  Patient instructed to take today's dose.  Patient verbalized understanding of these instructions.    

## 2013-08-09 NOTE — Progress Notes (Signed)
Anti-Coagulation Progress Note  Robert Newton is a 67 y.o. male who is currently on an anti-coagulation regimen.    RECENT RESULTS: Recent results are below, the most recent result is correlated with a dose of 19.5 mg. 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] Long term current use of anticoagulant [V58.61]        Comments       Anticoagulation Care Providers   Provider Role Specialty Phone number   Burns Spain, MD  Internal Medicine [phone]      ANTICOAG TODAY: Anticoagulation Summary as of 08/09/2013   INR goal 2.0-3.0  Selected INR 2.00 (08/09/2013)  Next INR check 09/06/2013  Target end date Indefinite   Indications  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  Patient instructed to take today's dose.  Patient verbalized understanding of these instructions.       FOLLOW-UP Return in 4 weeks (on 09/06/2013) for Follow up INR at  0845h.  Hulen Luster, III Pharm.D., CACP

## 2013-09-06 ENCOUNTER — Ambulatory Visit (INDEPENDENT_AMBULATORY_CARE_PROVIDER_SITE_OTHER): Payer: PRIVATE HEALTH INSURANCE | Admitting: Pharmacist

## 2013-09-06 DIAGNOSIS — Z7901 Long term (current) use of anticoagulants: Secondary | ICD-10-CM

## 2013-09-06 DIAGNOSIS — I6789 Other cerebrovascular disease: Secondary | ICD-10-CM

## 2013-09-06 LAB — POCT INR: INR: 2

## 2013-09-06 NOTE — Patient Instructions (Signed)
Patient instructed to take medications as defined in the Anti-coagulation Track section of this encounter.  Patient instructed to take today's dose.  Patient verbalized understanding of these instructions.    

## 2013-09-06 NOTE — Progress Notes (Signed)
Anti-Coagulation Progress Note  Merilynn FinlandMack Legate is a 68 y.o. male who is currently on an anti-coagulation regimen.    RECENT RESULTS: Recent results are below, the most recent result is correlated with a dose of 21 mg. 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] Long term current use of anticoagulant [V58.61]        Comments       Anticoagulation Care Providers   Provider Role Specialty Phone number   Burns SpainElizabeth A Butcher, MD  Internal Medicine [phone]      ANTICOAG TODAY: Anticoagulation Summary as of 09/06/2013   INR goal 2.0-3.0  Selected INR 2.00 (09/06/2013)  Next INR check 10/04/2013  Target end date Indefinite   Indications  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  Patient instructed to take today's dose.  Patient verbalized understanding of these instructions.       FOLLOW-UP Return in 4 weeks (on 10/04/2013) for Follow up INR at  0845h.  Hulen LusterJames Groce, III Pharm.D., CACP

## 2013-09-06 NOTE — Progress Notes (Signed)
Indication: Carotid stenosis, Neurosurgery's recommendation. Duration: Indefinite. INR: At target. Agree with Dr. Groce's assessment and plan. 

## 2013-09-10 ENCOUNTER — Other Ambulatory Visit: Payer: Self-pay | Admitting: Vascular Surgery

## 2013-09-10 DIAGNOSIS — I6529 Occlusion and stenosis of unspecified carotid artery: Secondary | ICD-10-CM

## 2013-09-19 ENCOUNTER — Other Ambulatory Visit: Payer: Self-pay | Admitting: Internal Medicine

## 2013-10-04 ENCOUNTER — Ambulatory Visit (INDEPENDENT_AMBULATORY_CARE_PROVIDER_SITE_OTHER): Payer: PRIVATE HEALTH INSURANCE | Admitting: Pharmacist

## 2013-10-04 DIAGNOSIS — I6789 Other cerebrovascular disease: Secondary | ICD-10-CM

## 2013-10-04 DIAGNOSIS — Z7901 Long term (current) use of anticoagulants: Secondary | ICD-10-CM

## 2013-10-04 LAB — POCT INR: INR: 2.8

## 2013-10-04 NOTE — Progress Notes (Signed)
Anti-Coagulation Progress Note  Robert Newton is a 68 y.o. male who is currently on an anti-coagulation regimen.    RECENT RESULTS: Recent results are below, the most recent result is correlated with a dose of 24 mg. 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] Long term current use of anticoagulant [V58.61]        Comments       Anticoagulation Care Providers   Provider Role Specialty Phone number   Burns SpainElizabeth A Butcher, MD  Internal Medicine [phone]      ANTICOAG TODAY: Anticoagulation Summary as of 10/04/2013   INR goal 2.0-3.0  Selected INR 2.80 (10/04/2013)  Next INR check 11/01/2013  Target end date Indefinite   Indications  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  Patient instructed to take today's dose.  Patient verbalized understanding of these instructions.       FOLLOW-UP Return in 4 weeks (on 11/01/2013) for Follow up INR at  0845h.  Hulen LusterJames Kapil Petropoulos, III Pharm.D., CACP

## 2013-10-04 NOTE — Patient Instructions (Signed)
Patient instructed to take medications as defined in the Anti-coagulation Track section of this encounter.  Patient instructed to take today's dose.  Patient verbalized understanding of these instructions.    

## 2013-10-19 ENCOUNTER — Encounter: Payer: Self-pay | Admitting: Family

## 2013-10-20 ENCOUNTER — Ambulatory Visit (INDEPENDENT_AMBULATORY_CARE_PROVIDER_SITE_OTHER): Payer: PRIVATE HEALTH INSURANCE | Admitting: Family

## 2013-10-20 ENCOUNTER — Ambulatory Visit (HOSPITAL_COMMUNITY)
Admission: RE | Admit: 2013-10-20 | Discharge: 2013-10-20 | Disposition: A | Discharge status: Home / Self Care | Payer: PRIVATE HEALTH INSURANCE | Source: Ambulatory Visit | Attending: Family | Admitting: Family

## 2013-10-20 ENCOUNTER — Encounter: Payer: Self-pay | Admitting: Family

## 2013-10-20 VITALS — BP 112/69 | HR 78 | Resp 16 | Wt 142.0 lb

## 2013-10-20 DIAGNOSIS — I6523 Occlusion and stenosis of bilateral carotid arteries: Secondary | ICD-10-CM

## 2013-10-20 DIAGNOSIS — I6529 Occlusion and stenosis of unspecified carotid artery: Secondary | ICD-10-CM

## 2013-10-20 NOTE — Addendum Note (Signed)
Addended by: Adria DillELDRIDGE-LEWIS, Jean Skow L on: 10/20/2013 04:49 PM   Modules accepted: Orders

## 2013-10-20 NOTE — Patient Instructions (Addendum)
Stroke Prevention Some medical conditions and behaviors are associated with an increased chance of having a stroke. You may prevent a stroke by making healthy choices and managing medical conditions. HOW CAN I REDUCE MY RISK OF HAVING A STROKE?   Stay physically active. Get at least 30 minutes of activity on most or all days.  Do not smoke. It may also be helpful to avoid exposure to secondhand smoke.  Limit alcohol use. Moderate alcohol use is considered to be:  No more than 2 drinks per day for men.  No more than 1 drink per day for nonpregnant women.  Eat healthy foods. This involves  Eating 5 or more servings of fruits and vegetables a day.  Following a diet that addresses high blood pressure (hypertension), high cholesterol, diabetes, or obesity.  Manage your cholesterol levels.  A diet low in saturated fat, trans fat, and cholesterol and high in fiber may control cholesterol levels.  Take any prescribed medicines to control cholesterol as directed by your health care provider.  Manage your diabetes.  A controlled-carbohydrate, controlled-sugar diet is recommended to manage diabetes.  Take any prescribed medicines to control diabetes as directed by your health care provider.  Control your hypertension.  A low-salt (sodium), low-saturated fat, low-trans fat, and low-cholesterol diet is recommended to manage hypertension.  Take any prescribed medicines to control hypertension as directed by your health care provider.  Maintain a healthy weight.  A reduced-calorie, low-sodium, low-saturated fat, low-trans fat, low-cholesterol diet is recommended to manage weight.  Stop drug abuse.  Avoid taking birth control pills.  Talk to your health care provider about the risks of taking birth control pills if you are over 35 years old, smoke, get migraines, or have ever had a blood clot.  Get evaluated for sleep disorders (sleep apnea).  Talk to your health care provider about  getting a sleep evaluation if you snore a lot or have excessive sleepiness.  Take medicines as directed by your health care provider.  For some people, aspirin or blood thinners (anticoagulants) are helpful in reducing the risk of forming abnormal blood clots that can lead to stroke. If you have the irregular heart rhythm of atrial fibrillation, you should be on a blood thinner unless there is a good reason you cannot take them.  Understand all your medicine instructions.  Make sure that other other conditions (such as anemia or atherosclerosis) are addressed. SEEK IMMEDIATE MEDICAL CARE IF:   You have sudden weakness or numbness of the face, arm, or leg, especially on one side of the body.  Your face or eyelid droops to one side.  You have sudden confusion.  You have trouble speaking (aphasia) or understanding.  You have sudden trouble seeing in one or both eyes.  You have sudden trouble walking.  You have dizziness.  You have a loss of balance or coordination.  You have a sudden, severe headache with no known cause.  You have new chest pain or an irregular heartbeat. Any of these symptoms may represent a serious problem that is an emergency. Do not wait to see if the symptoms will go away. Get medical help at once. Call your local emergency services  (911 in [place] Do not drive yourself to the hospital. Document Released: 09/05/2004 Document Revised: 05/19/2013 Document Reviewed: 01/29/2013 Exit[employer] Patient Information 2014 [employer]   Smoking Cessation Quitting smoking is important to your health and has many advantages. However, it is not always easy to quit since nicotine is a   very addictive drug. Often times, people try 3 times or more before being able to quit. This document explains the best ways for you to prepare to quit smoking. Quitting takes hard work and a lot of effort, but you can do it. ADVANTAGES OF QUITTING SMOKING  You will live longer, feel better,  and live better.  Your body will feel the impact of quitting smoking almost immediately.  Within 20 minutes, blood pressure decreases. Your pulse returns to its normal level.  After 8 hours, carbon monoxide levels in the blood return to normal. Your oxygen level increases.  After 24 hours, the chance of having a heart attack starts to decrease. Your breath, hair, and body stop smelling like smoke.  After 48 hours, damaged nerve endings begin to recover. Your sense of taste and smell improve.  After 72 hours, the body is virtually free of nicotine. Your bronchial tubes relax and breathing becomes easier.  After 2 to 12 weeks, lungs can hold more air. Exercise becomes easier and circulation improves.  The risk of having a heart attack, stroke, cancer, or lung disease is greatly reduced.  After 1 year, the risk of coronary heart disease is cut in half.  After 5 years, the risk of stroke falls to the same as a nonsmoker.  After 10 years, the risk of lung cancer is cut in half and the risk of other cancers decreases significantly.  After 15 years, the risk of coronary heart disease drops, usually to the level of a nonsmoker.  If you are pregnant, quitting smoking will improve your chances of having a healthy baby.  The people you live with, especially any children, will be healthier.  You will have extra money to spend on things other than cigarettes. QUESTIONS TO THINK ABOUT BEFORE ATTEMPTING TO QUIT You may want to talk about your answers with your caregiver.  Why do you want to quit?  If you tried to quit in the past, what helped and what did not?  What will be the most difficult situations for you after you quit? How will you plan to handle them?  Who can help you through the tough times? Your family? Friends? A caregiver?  What pleasures do you get from smoking? What ways can you still get pleasure if you quit? Here are some questions to ask your caregiver:  How can you  help me to be successful at quitting?  What medicine do you think would be best for me and how should I take it?  What should I do if I need more help?  What is smoking withdrawal like? How can I get information on withdrawal? GET READY  Set a quit date.  Change your environment by getting rid of all cigarettes, ashtrays, matches, and lighters in your home, car, or work. Do not let people smoke in your home.  Review your past attempts to quit. Think about what worked and what did not. GET SUPPORT AND ENCOURAGEMENT You have a better chance of being successful if you have help. You can get support in many ways.  Tell your family, friends, and co-workers that you are going to quit and need their support. Ask them not to smoke around you.  Get individual, group, or telephone counseling and support. Programs are available at local hospitals and health centers. Call your local health department for information about programs in your area.  Spiritual beliefs and practices may help some smokers quit.  Download a "quit meter" on your computer   to keep track of quit statistics, such as how long you have gone without smoking, cigarettes not smoked, and money saved.  Get a self-help book about quitting smoking and staying off of tobacco. LEARN NEW SKILLS AND BEHAVIORS  Distract yourself from urges to smoke. Talk to someone, go for a walk, or occupy your time with a task.  Change your normal routine. Take a different route to work. Drink tea instead of coffee. Eat breakfast in a different place.  Reduce your stress. Take a hot bath, exercise, or read a book.  Plan something enjoyable to do every day. Reward yourself for not smoking.  Explore interactive web-based programs that specialize in helping you quit. GET MEDICINE AND USE IT CORRECTLY Medicines can help you stop smoking and decrease the urge to smoke. Combining medicine with the above behavioral methods and support can greatly increase  your chances of successfully quitting smoking.  Nicotine replacement therapy helps deliver nicotine to your body without the negative effects and risks of smoking. Nicotine replacement therapy includes nicotine gum, lozenges, inhalers, nasal sprays, and skin patches. Some may be available over-the-counter and others require a prescription.  Antidepressant medicine helps people abstain from smoking, but how this works is unknown. This medicine is available by prescription.  Nicotinic receptor partial agonist medicine simulates the effect of nicotine in your brain. This medicine is available by prescription. Ask your caregiver for advice about which medicines to use and how to use them based on your health history. Your caregiver will tell you what side effects to look out for if you choose to be on a medicine or therapy. Carefully read the information on the package. Do not use any other product containing nicotine while using a nicotine replacement product.  RELAPSE OR DIFFICULT SITUATIONS Most relapses occur within the first 3 months after quitting. Do not be discouraged if you start smoking again. Remember, most people try several times before finally quitting. You may have symptoms of withdrawal because your body is used to nicotine. You may crave cigarettes, be irritable, feel very hungry, cough often, get headaches, or have difficulty concentrating. The withdrawal symptoms are only temporary. They are strongest when you first quit, but they will go away within 10 14 days. To reduce the chances of relapse, try to:  Avoid drinking alcohol. Drinking lowers your chances of successfully quitting.  Reduce the amount of caffeine you consume. Once you quit smoking, the amount of caffeine in your body increases and can give you symptoms, such as a rapid heartbeat, sweating, and anxiety.  Avoid smokers because they can make you want to smoke.  Do not let weight gain distract you. Many smokers will gain  weight when they quit, usually less than 10 pounds. Eat a healthy diet and stay active. You can always lose the weight gained after you quit.  Find ways to improve your mood other than smoking. FOR MORE INFORMATION  www.smokefree.gov  Document Released: 07/23/2001 Document Revised: 01/28/2012 Document Reviewed: 11/07/2011 [employer] Patient Information 2014 [employer].  

## 2013-10-20 NOTE — Progress Notes (Signed)
Established Carotid Patient   History of Present Illness  Robert Newton is a 68 y.o. male patient of Dr. Edilia Bo followed for known carotid stenosis. The patient does have greater than 80% ICA stenosis on the right but this is inoperable per Dr. Adele Dan last note due to intracranial disease, the patient does have left-sided weakness from a previous stroke in 2004, no further stroke activity, per pt. He returns today for routine surveillance.  He denies any history of MI.  Patient has not had previous carotid artery intervention.   The patient denies amaurosis fugax or monocular blindness.  The patient  denies facial drooping.  Pt. Reports left  hemiplegia.  The patient denies receptive or expressive aphasia.    The patient's previous neurologic deficits are Unchanged.   Pt denies New Medical or Surgical History. He helps his wife who has had a leg amputation and has DM.  Pt Diabetic: No Pt smoker: smoker  (1/2 ppd x 50 yrs)  Pt meds include: Statin : No ASA: Yes Other anticoagulants/antiplatelets: takes coumadin since his CVA   Past Medical History  Diagnosis Date  . Hyperlipidemia   . Hypertension   . Low back pain   . Tobacco abuse   . 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 Carotid artery stenosis     right  . Asthenia   . Erectile dysfunction   . Hypokalemia   . PUD (peptic ulcer disease)     hx of  . Impetigo     hx of  . Hypokalemia   . Abnormal CT of the abdomen     abnormal lymph nodes  . Abdominal pain     RLQ    Social History History  Substance Use Topics  . Smoking status: Current Every Day Smoker -- 0.50 packs/day    Types: Cigarettes  . Smokeless tobacco: Former Neurosurgeon    Quit date: 08/12/1972  . Alcohol Use: No    Family History Family History  Problem Relation Age of Onset  . Hypertension Mother   . Hypertension Sister   . Diabetes Sister   . Stroke Father     Surgical History History reviewed. No pertinent past surgical history.  No  Known Allergies  Current Outpatient Prescriptions  Medication Sig Dispense Refill  . aspirin 81 MG EC tablet Take 81 mg by mouth daily.        Marland Kitchen atorvastatin (LIPITOR) 40 MG tablet TAKE 1 TABLET DAILY AT NIGHT  90 tablet  1  . docusate sodium (COLACE) 100 MG capsule Take 100 mg by mouth daily.        Marland Kitchen ibuprofen (ADVIL,MOTRIN) 200 MG tablet Take 200 mg by mouth continuous as needed.      Marland Kitchen omeprazole (PRILOSEC) 20 MG capsule TAKE ONE CAPSULE AT BEDTIME  90 capsule  0  . Thiamine HCl (VITAMIN B-1) 250 MG tablet Take 250 mg by mouth daily.      Marland Kitchen warfarin (COUMADIN) 3 MG tablet Take 1 tablet (3mg ) except Monday and Thursday take 1.5 tablets (4.5 mg)  31 tablet  2   No current facility-administered medications for this visit.    Review of Systems : See HPI for pertinent positives and negatives.  Physical Examination  Filed Vitals:   10/20/13 1445  BP: 112/69  Pulse: 78  Resp: 16   Filed Weights   10/20/13 1445  Weight: 142 lb (64.411 kg)   Body mass index is 18.74 kg/(m^2).   General: WDWN male in NAD  GAIT: using cane Eyes: PERRLA Pulmonary:  Non-labored, CTAB, Negative  Rales, Negative rhonchi, & Negative wheezing.  Cardiac: regular Rhythm ,  Negative detected murmur.  VASCULAR EXAM Carotid Bruits Left Right   Negative Negative    Aorta is  palpable. Radial pulses are 2+ palpable and equal.                                                                                                                            LE Pulses LEFT RIGHT       POPLITEAL  not palpable   not palpable       POSTERIOR TIBIAL  not palpable   not palpable        DORSALIS PEDIS      ANTERIOR TIBIAL not palpable  not palpable     Gastrointestinal: soft, nontender, BS WNL, no r/g,  negative masses.  Musculoskeletal: Negative muscle atrophy/wasting. M/S 5/5 throughout except 4/5 in left leg, Extremities without ischemic changes.  Neurologic: A&O X 3; Appropriate Affect ; SENSATION ;normal;   Speech is normal CN 2-12 intact, Pain and light touch intact in extremities, Motor exam as listed above.   Non-Invasive Vascular Imaging CAROTID DUPLEX 10/20/2013   CEREBROVASCULAR DUPLEX EVALUATION    INDICATION: Carotid artery disease    PREVIOUS INTERVENTION(S): None    DUPLEX EXAM: Carotid duplex    RIGHT  LEFT  Peak Systolic Velocities (cm/s) End Diastolic Velocities (cm/s) Plaque LOCATION Peak Systolic Velocities (cm/s) End Diastolic Velocities (cm/s) Plaque  70 7 - CCA PROXIMAL 119 27 -  60 9 - CCA MID 88 30 -  40 8 HT CCA DISTAL 87 33 HT  105 14 - ECA 173 30 -  413 126 HT ICA PROXIMAL 82 26 HT  17 7 - ICA MID 110 40 -  21 10 - ICA DISTAL 135 50 -    6.8 ICA / CCA Ratio (PSV) 1.53  Antegrade Vertebral Flow Antegrade  118 Brachial Systolic Pressure (mmHg) 110  Triphasic Brachial Artery Waveforms Triphasic    Plaque Morphology:  HM = Homogeneous, HT = Heterogeneous, CP = Calcific Plaque, SP = Smooth Plaque, IP = Irregular Plaque     ADDITIONAL FINDINGS:     IMPRESSION: 1. 80 - 99% right internal carotid artery stenosis with an abnormal mid to distal artery waveform. 2. Less than 40% left internal carotid artery stenosis.    Compared to the previous exam:  Increased velocity on the right.    Assessment: Robert Newton is a 68 y.o. male who presents with asymptomatic 80 - 99 % Right ICA stenosis and minimal left ICA stenosis. The right  ICA stenosis is slightly Worse from previous exam. Unfortunately he continues to smoke, but he does not have DM.  Plan:  He was counseled re smoking cessation. Based on today's exam and carotid Duplex results, and after discussing with Dr. Edilia Bo, patient advised to follow-up in 1 year with Carotid Duplex scan.   I discussed  in depth with the patient the nature of atherosclerosis, and emphasized the importance of maximal medical management including strict control of blood pressure, blood glucose, and lipid levels, obtaining  regular exercise, and cessation of smoking.  The patient is aware that without maximal medical management the underlying atherosclerotic disease process will progress, limiting the benefit of any interventions. The patient was given information about stroke prevention and what symptoms should prompt the patient to seek immediate medical care. Thank you for allowing us to participate in this patient's care.  Charisse MarchSuzanne Roseanne Juenger, RN, MSN, FNP-C [facility] Office: [phone]  Clinic Physician: Edilia BoDickson  10/20/2013 2:55 PM

## 2013-10-27 ENCOUNTER — Other Ambulatory Visit: Payer: Self-pay | Admitting: Internal Medicine

## 2013-11-01 ENCOUNTER — Ambulatory Visit (INDEPENDENT_AMBULATORY_CARE_PROVIDER_SITE_OTHER): Payer: PRIVATE HEALTH INSURANCE | Admitting: Pharmacist

## 2013-11-01 DIAGNOSIS — I6789 Other cerebrovascular disease: Secondary | ICD-10-CM

## 2013-11-01 DIAGNOSIS — Z8673 Personal history of transient ischemic attack (TIA), and cerebral infarction without residual deficits: Secondary | ICD-10-CM

## 2013-11-01 DIAGNOSIS — Z7901 Long term (current) use of anticoagulants: Secondary | ICD-10-CM

## 2013-11-01 LAB — POCT INR: INR: 3

## 2013-11-01 NOTE — Progress Notes (Signed)
Anti-Coagulation Progress Note  Robert Newton is a 68 y.o. male who is currently on an anti-coagulation regimen.    RECENT RESULTS: Recent results are below, the most recent result is correlated with a dose of 24 mg. per week: Lab Results  Component Value Date   INR 3.00 11/01/2013   INR 2.80 10/04/2013   INR 2.00 09/06/2013    ANTI-COAG DOSE: Anticoagulation Dose Instructions as of 11/01/2013     Glynis SmilesSun Mon Tue Wed Thu Fri Sat   New Dose 3 mg 3 mg 3 mg 3 mg 3 mg 3 mg 3 mg       ANTICOAG SUMMARY: Anticoagulation Episode Summary   Current INR goal 2.0-3.0  Next INR check 11/29/2013  INR from last check 3.00 (11/01/2013)  Weekly max dose   Target end date Indefinite  [facility] check [facility]  Preferred lab   Send INR reminders to ANTICOAG IMP   Indications  CEREBROVASCULAR ACCIDENT ACUTE [436] Long term current use of anticoagulant [V58.61]        Comments       Anticoagulation Care Providers   Provider Role Specialty Phone number   Burns SpainElizabeth A Butcher, MD  Internal Medicine [phone]      ANTICOAG TODAY: Anticoagulation Summary as of 11/01/2013   INR goal 2.0-3.0  Selected INR 3.00 (11/01/2013)  Next INR check 11/29/2013  Target end date Indefinite   Indications  CEREBROVASCULAR ACCIDENT ACUTE [436] Long term current use of anticoagulant [V58.61]      Anticoagulation Episode Summary   [facility]   Preferred lab    Send INR reminders to ANTICOAG IMP   Comments     Anticoagulation Care Providers   Provider Role Specialty Phone number   Burns SpainElizabeth A Butcher, MD  Internal Medicine [phone]      PATIENT INSTRUCTIONS: Patient Instructions  Patient instructed to take medications as defined in the Anti-coagulation Track section of this encounter.  Patient instructed to take today's dose.  Patient verbalized understanding of these instructions.       FOLLOW-UP Return in 4 weeks (on 11/29/2013) for Follow up INR at  0830h.  Hulen LusterJames Ziyanna Tolin, III Pharm.D., CACP

## 2013-11-01 NOTE — Progress Notes (Signed)
Indication: Carotid stenosis, Neurosurgery's recommendation. Duration: Indefinite. INR: At target. Agree with Dr. Groce's assessment and plan. 

## 2013-11-01 NOTE — Patient Instructions (Signed)
Patient instructed to take medications as defined in the Anti-coagulation Track section of this encounter.  Patient instructed to take today's dose.  Patient verbalized understanding of these instructions.    

## 2013-11-17 ENCOUNTER — Encounter: Payer: Self-pay | Admitting: Internal Medicine

## 2013-11-29 ENCOUNTER — Ambulatory Visit: Payer: PRIVATE HEALTH INSURANCE

## 2013-12-06 ENCOUNTER — Ambulatory Visit (INDEPENDENT_AMBULATORY_CARE_PROVIDER_SITE_OTHER): Payer: PRIVATE HEALTH INSURANCE | Admitting: Pharmacist

## 2013-12-06 DIAGNOSIS — I6789 Other cerebrovascular disease: Secondary | ICD-10-CM

## 2013-12-06 DIAGNOSIS — Z7901 Long term (current) use of anticoagulants: Secondary | ICD-10-CM

## 2013-12-06 LAB — POCT INR: INR: 2.4

## 2013-12-06 NOTE — Progress Notes (Signed)
Anti-Coagulation Progress Note  Merilynn FinlandMack Granier is a 68 y.o. male who is currently on an anti-coagulation regimen.    RECENT RESULTS: Recent results are below, the most recent result is correlated with a dose of 21 mg. per week: Lab Results  Component Value Date   INR 2.40 12/06/2013   INR 3.00 11/01/2013   INR 2.80 10/04/2013    ANTI-COAG DOSE: Anticoagulation Dose Instructions as of 12/06/2013     Glynis SmilesSun Mon Tue Wed Thu Fri Sat   New Dose 3 mg 3 mg 3 mg 3 mg 3 mg 3 mg 3 mg       ANTICOAG SUMMARY: Anticoagulation Episode Summary   Current INR goal 2.0-3.0  Next INR check 01/10/2014  INR from last check 2.40 (12/06/2013)  Weekly max dose   Target end date Indefinite  INR check location   Preferred lab   Send INR reminders to    Indications  CEREBROVASCULAR ACCIDENT ACUTE (Resolved) [436] Long term current use of anticoagulant [V58.61]        Comments         ANTICOAG TODAY: Anticoagulation Summary as of 12/06/2013   INR goal 2.0-3.0  Selected INR 2.40 (12/06/2013)  Next INR check 01/10/2014  Target end date Indefinite   Indications  CEREBROVASCULAR ACCIDENT ACUTE (Resolved) [436] Long term current use of anticoagulant [V58.61]      Anticoagulation Episode Summary   INR check location    Preferred lab    Send INR reminders to    Comments       PATIENT INSTRUCTIONS: Patient Instructions  Patient instructed to take medications as defined in the Anti-coagulation Track section of this encounter.  Patient instructed to take today's dose.  Patient verbalized understanding of these instructions.       FOLLOW-UP Return in 5 weeks (on 01/10/2014) for Follow up INR at 0830h.  Hulen LusterJames Groce, III Pharm.D., CACP

## 2013-12-06 NOTE — Patient Instructions (Signed)
Patient instructed to take medications as defined in the Anti-coagulation Track section of this encounter.  Patient instructed to take today's dose.  Patient verbalized understanding of these instructions.    

## 2014-01-06 ENCOUNTER — Encounter: Payer: PRIVATE HEALTH INSURANCE | Admitting: Internal Medicine

## 2014-01-06 ENCOUNTER — Encounter: Payer: Self-pay | Admitting: Internal Medicine

## 2014-01-10 ENCOUNTER — Ambulatory Visit (INDEPENDENT_AMBULATORY_CARE_PROVIDER_SITE_OTHER): Payer: PRIVATE HEALTH INSURANCE | Admitting: Pharmacist

## 2014-01-10 DIAGNOSIS — Z7901 Long term (current) use of anticoagulants: Secondary | ICD-10-CM

## 2014-01-10 DIAGNOSIS — I6789 Other cerebrovascular disease: Secondary | ICD-10-CM

## 2014-01-10 LAB — POCT INR: INR: 3.1

## 2014-01-10 NOTE — Progress Notes (Addendum)
Anti-Coagulation Progress Note  Robert Newton is a 68 y.o. male who reports to the clinic for monitoring of anticoagulation treatment.    RECENT RESULTS: Recent results are below, the most recent result is correlated with a dose of 21 mg. per week: Lab Results  Component Value Date   INR 3.1 01/10/2014   INR 2.40 12/06/2013   INR 3.00 11/01/2013    ANTI-COAG DOSE:  Anticoagulation Dose Instructions as of 01/10/2014     [name] Tue Wed Thu Fri Sat   New Dose 3 mg 3 mg 3 mg 3 mg 3 mg 3 mg 3 mg     ANTICOAG SUMMARY: Anticoagulation Episode Summary   Current INR goal 2.0-3.0  Next INR check 01/10/2014  INR from last check 2.40 (12/06/2013)  Most recent INR 3.1! (01/10/2014)  Weekly max dose   Target end date Indefinite  INR check location   Preferred lab   Send INR reminders to    Indications  CEREBROVASCULAR ACCIDENT ACUTE (Resolved) [436] Long term current use of anticoagulant [V58.61]        Comments         ANTICOAG TODAY: Anticoagulation Summary as of 01/10/2014   INR goal 2.0-3.0  Selected INR   Next INR check   Target end date Indefinite   Indications  CEREBROVASCULAR ACCIDENT ACUTE (Resolved) [436] Long term current use of anticoagulant [V58.61]      Anticoagulation Episode Summary   INR check location    Preferred lab    Send INR reminders to    Comments       PATIENT INSTRUCTIONS: Patient Instructions  Patient instructed to take medications as defined in the Anti-coagulation Track section of this encounter.  Patient instructed to take today's dose.  Patient verbalized understanding of these instructions.     FOLLOW-UP Return in about 4 weeks (around 02/07/2014) for Follow up INR at 0830.  Marzetta Board, PharmD BCPS, BCACP

## 2014-01-10 NOTE — Patient Instructions (Signed)
Patient instructed to take medications as defined in the Anti-coagulation Track section of this encounter.  Patient instructed to take today's dose.  Patient verbalized understanding of these instructions.    

## 2014-01-12 MED ORDER — WARFARIN SODIUM 3 MG PO TABS: 3.0000 mg | ORAL_TABLET | Freq: Every day | ORAL | Status: DC

## 2014-01-12 NOTE — Addendum Note (Signed)
Addended by: Mliss Fritz on: 01/12/2014 11:53 AM   Modules accepted: Orders

## 2014-01-24 ENCOUNTER — Other Ambulatory Visit: Payer: Self-pay | Admitting: Internal Medicine

## 2014-01-24 ENCOUNTER — Ambulatory Visit: Payer: PRIVATE HEALTH INSURANCE

## 2014-02-07 ENCOUNTER — Ambulatory Visit (INDEPENDENT_AMBULATORY_CARE_PROVIDER_SITE_OTHER): Payer: PRIVATE HEALTH INSURANCE | Admitting: Pharmacist

## 2014-02-07 DIAGNOSIS — I6789 Other cerebrovascular disease: Secondary | ICD-10-CM

## 2014-02-07 DIAGNOSIS — Z7901 Long term (current) use of anticoagulants: Secondary | ICD-10-CM

## 2014-02-07 LAB — POCT INR: INR: 3

## 2014-02-07 NOTE — Patient Instructions (Signed)
Patient instructed to take medications as defined in the Anti-coagulation Track section of this encounter.  Patient instructed to take today's dose.  Patient verbalized understanding of these instructions.    

## 2014-02-07 NOTE — Progress Notes (Signed)
INTERNAL MEDICINE TEACHING ATTENDING ADDENDUM - Earl LagosNischal Narendra M.D  Duration- indefinite, Indication- CVA, INR- indefinite. Agree with Dr. Saralyn PilarGroce's recommendations as outlined in his note.

## 2014-02-07 NOTE — Progress Notes (Signed)
Anti-Coagulation Progress Note  Merilynn FinlandMack Hampshire is a 68 y.o. male who is currently on an anti-coagulation regimen.    RECENT RESULTS: Recent results are below, the most recent result is correlated with a dose of 21 mg. per week: Lab Results  Component Value Date   INR 3.00 02/07/2014   INR 3.1 01/10/2014   INR 2.40 12/06/2013    ANTI-COAG DOSE: Anticoagulation Dose Instructions as of 02/07/2014     Glynis SmilesSun Mon Tue Wed Thu Fri Sat   New Dose 3 mg 1.5 mg 3 mg 3 mg 3 mg 3 mg 3 mg       ANTICOAG SUMMARY: Anticoagulation Episode Summary   Current INR goal 2.0-3.0  Next INR check 03/07/2014  INR from last check 3.00 (02/07/2014)  Weekly max dose   Target end date Indefinite  INR check location   Preferred lab   Send INR reminders to    Indications  CEREBROVASCULAR ACCIDENT ACUTE (Resolved) [436] Long term current use of anticoagulant [V58.61]        Comments         ANTICOAG TODAY: Anticoagulation Summary as of 02/07/2014   INR goal 2.0-3.0  Selected INR 3.00 (02/07/2014)  Next INR check 03/07/2014  Target end date Indefinite   Indications  CEREBROVASCULAR ACCIDENT ACUTE (Resolved) [436] Long term current use of anticoagulant [V58.61]      Anticoagulation Episode Summary   INR check location    Preferred lab    Send INR reminders to    Comments       PATIENT INSTRUCTIONS: Patient Instructions  Patient instructed to take medications as defined in the Anti-coagulation Track section of this encounter.  Patient instructed to take today's dose.  Patient verbalized understanding of these instructions.       FOLLOW-UP Return in 4 weeks (on 03/07/2014) for Follow up INR at 0830h.  Hulen LusterJames Groce, III Pharm.D., CACP

## 2014-02-10 ENCOUNTER — Ambulatory Visit (INDEPENDENT_AMBULATORY_CARE_PROVIDER_SITE_OTHER): Payer: PRIVATE HEALTH INSURANCE | Admitting: Internal Medicine

## 2014-02-10 ENCOUNTER — Encounter: Payer: Self-pay | Admitting: Internal Medicine

## 2014-02-10 VITALS — BP 123/74 | HR 68 | Temp 97.3°F | Ht 73.0 in | Wt 143.8 lb

## 2014-02-10 DIAGNOSIS — Z299 Encounter for prophylactic measures, unspecified: Secondary | ICD-10-CM

## 2014-02-10 DIAGNOSIS — F17200 Nicotine dependence, unspecified, uncomplicated: Secondary | ICD-10-CM

## 2014-02-10 DIAGNOSIS — Z720 Tobacco use: Secondary | ICD-10-CM

## 2014-02-10 DIAGNOSIS — I10 Essential (primary) hypertension: Secondary | ICD-10-CM

## 2014-02-10 DIAGNOSIS — Z23 Encounter for immunization: Secondary | ICD-10-CM

## 2014-02-10 DIAGNOSIS — E785 Hyperlipidemia, unspecified: Secondary | ICD-10-CM

## 2014-02-10 DIAGNOSIS — I6529 Occlusion and stenosis of unspecified carotid artery: Secondary | ICD-10-CM

## 2014-02-10 LAB — COMPLETE METABOLIC PANEL WITH GFR
ALBUMIN: 3.6 g/dL (ref 3.5–5.2)
ALT: 20 U/L (ref 0–53)
AST: 20 U/L (ref 0–37)
Alkaline Phosphatase: 78 U/L (ref 39–117)
BILIRUBIN TOTAL: 0.7 mg/dL (ref 0.2–1.2)
BUN: 16 mg/dL (ref 6–23)
CHLORIDE: 101 meq/L (ref 96–112)
CO2: 28 meq/L (ref 19–32)
Calcium: 8.8 mg/dL (ref 8.4–10.5)
Creat: 0.8 mg/dL (ref 0.50–1.35)
GFR, Est Non African American: >89 mL/min
GLUCOSE: 92 mg/dL (ref 70–99)
Potassium: 3.8 mEq/L (ref 3.5–5.3)
SODIUM: 136 meq/L (ref 135–145)
TOTAL PROTEIN: 7.5 g/dL (ref 6.0–8.3)

## 2014-02-10 LAB — CBC WITH DIFFERENTIAL/PLATELET
Basophils Absolute: 0 10*3/uL (ref 0.0–0.1)
Basophils Relative: 1 % (ref 0–1)
EOS ABS: 0.1 10*3/uL (ref 0.0–0.7)
Eosinophils Relative: 3 % (ref 0–5)
HCT: 38.4 % — ABNORMAL LOW (ref 39.0–52.0)
HEMOGLOBIN: 12.9 g/dL — AB (ref 13.0–17.0)
Lymphocytes Relative: 29 % (ref 12–46)
Lymphs Abs: 1 10*3/uL (ref 0.7–4.0)
MCH: 27.6 pg (ref 26.0–34.0)
MCHC: 33.6 g/dL (ref 30.0–36.0)
MCV: 82.2 fL (ref 78.0–100.0)
Monocytes Absolute: 0.3 10*3/uL (ref 0.1–1.0)
Monocytes Relative: 8 % (ref 3–12)
NEUTROS PCT: 59 % (ref 43–77)
Neutro Abs: 2 10*3/uL (ref 1.7–7.7)
Platelets: 210 10*3/uL (ref 150–400)
RBC: 4.67 MIL/uL (ref 4.22–5.81)
RDW: 14.5 % (ref 11.5–15.5)
WBC: 3.4 10*3/uL — ABNORMAL LOW (ref 4.0–10.5)

## 2014-02-10 LAB — LIPID PANEL
Cholesterol: 99 mg/dL (ref 0–200)
HDL: 35 mg/dL — ABNORMAL LOW (ref >39)
LDL Cholesterol: 58 mg/dL (ref 0–99)
Total CHOL/HDL Ratio: 2.8 Ratio
Triglycerides: 29 mg/dL (ref <150)
VLDL: 6 mg/dL (ref 0–40)

## 2014-02-10 NOTE — Assessment & Plan Note (Signed)
Will check lipid panel today  Lipid Panel     Component Value Date/Time   CHOL 102 03/08/2013 0918   TRIG 32 03/08/2013 0918   HDL 33* 03/08/2013 0918   CHOLHDL 3.1 03/08/2013 0918   VLDL 6 03/08/2013 0918   LDLCALC 63 03/08/2013 0918   Continue Lipitor 40

## 2014-02-10 NOTE — Assessment & Plan Note (Signed)
  Assessment: Progress toward smoking cessation:  smoking less Barriers to progress toward smoking cessation:   (duration) Comments: will intermittently cut down amt smoking   Plan: Instruction/counseling given:  I counseled patient on the dangers of tobacco use, advised patient to stop smoking, and reviewed strategies to maximize success; not ready to quit. Educational resources provided:  other (see comments) Self management tools provided:  other (see comments) Medications to assist with smoking cessation:  None Patient agreed to the following self-care plans for smoking cessation: cut down the number of cigarettes smoked  Other plans: will encourage cessation each visit

## 2014-02-10 NOTE — Progress Notes (Signed)
Case discussed with Dr. McLean soon after the resident saw the patient.  We reviewed the resident's history and exam and pertinent patient test results.  I agree with the assessment, diagnosis, and plan of care documented in the resident's note. 

## 2014-02-10 NOTE — Assessment & Plan Note (Signed)
On Coumadin INR 6/29 3.0 at goal. Follows with [facility], stenosis worsening  Encouraged to stop smoking

## 2014-02-10 NOTE — Assessment & Plan Note (Addendum)
Will give pneumonia vaccine today Will check CMET and CBC and lipid panel today

## 2014-02-10 NOTE — Addendum Note (Signed)
Addended by: Annett GulaMCLEAN, Jessina Marse N on: 02/10/2014 01:13 PM   Modules accepted: Level of Service

## 2014-02-10 NOTE — Progress Notes (Signed)
   Subjective:    Patient ID: Robert Newton, male    DOB: 1945/08/29, 68 y.o.   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 He does not need refills of medications 2) Smoking-still smoking 1/2 ppd x 50 years and will occasionally cut back 3) on Coumadin due to carotid artery stenosis on the right which per f/u visit with [facility] is worsening.    HM: due to pneumonia vaccine will get today   SH: 3 boys, used to work in maintenance at [employer].  His wife is now home from SNF and cooking. Stopped drinking 32 years ago.       Review of Systems  Respiratory: Negative for shortness of breath.   Cardiovascular: Negative for chest pain and leg swelling.  Gastrointestinal: Negative for constipation.  Genitourinary: Negative for difficulty urinating.  Musculoskeletal: Negative for arthralgias.       Objective:   Physical Exam  Nursing note and vitals reviewed. Constitutional: He is oriented to person, place, and time. Vital signs are normal. He appears well-developed and well-nourished. He is cooperative.  HENT:  Head: Normocephalic and atraumatic.  Mouth/Throat: Oropharynx is clear and moist and mucous membranes are normal. Abnormal dentition. No oropharyngeal exudate.  Eyes: Conjunctivae are normal. Pupils are equal, round, and reactive to light. Right eye exhibits no discharge. Left eye exhibits no discharge. No scleral icterus.  Cardiovascular: Normal rate, regular rhythm, S1 normal, S2 normal and normal heart sounds.   No murmur heard. No lower ext edema     Pulmonary/Chest: Effort normal and breath sounds normal.  Abdominal: Soft. Bowel sounds are normal. He exhibits no distension. There is no tenderness.  Musculoskeletal: He exhibits no edema.  Neurological: He is alert and oriented to person, place, and time. He has normal strength. No sensory deficit.  BL neuro exam with residual left hemiparesis from prior stroke 4/5.  Mild left hand contracture  Uses wheelchair for long distance otherwise walks with cane  Skin: Skin is warm, dry and intact.  Psychiatric: He has a normal mood and affect. His speech is normal and behavior is normal. Judgment and thought content normal. Cognition and memory are normal.          Assessment & Plan:  F/u in 3-6 months, sooner if needed

## 2014-02-10 NOTE — Patient Instructions (Addendum)
General Instructions: Please follow up in 3-6 months sooner if needed  Take care    Treatment Goals:  Goals (1 Years of Data) as of 02/10/14         As of Today 10/20/13 10/20/13 07/12/13 03/08/13     Blood Pressure    . Blood Pressure < 140/90  123/74 112/69 117/77 151/81 114/75      Progress Toward Treatment Goals:  Treatment Goal 02/10/2014  Blood pressure at goal  Stop smoking smoking less    Self Care Goals & Plans:  Self Care Goal 02/10/2014  Manage my medications take my medicines as prescribed; bring my medications to every visit; refill my medications on time  Monitor my health keep track of my blood pressure  Eat healthy foods eat foods that are low in salt; eat baked foods instead of fried foods; eat more vegetables; drink diet soda or water instead of juice or soda; eat fruit for snacks and desserts; eat smaller portions  Be physically active take a walk every day  Stop smoking cut down the number of cigarettes smoked  Meeting treatment goals maintain the current self-care plan    No flowsheet data found.   Care Management & Community Referrals:  Referral 02/10/2014  Referrals made for care management support none needed  Referrals made to community resources none     Smoking Cessation Quitting smoking is important to your health and has many advantages. However, it is not always easy to quit since nicotine is a very addictive drug. Often times, people try 3 times or more before being able to quit. This document explains the best ways for you to prepare to quit smoking. Quitting takes hard work and a lot of effort, but you can do it. ADVANTAGES OF QUITTING SMOKING  You will live longer, feel better, and live better.  Your body will feel the impact of quitting smoking almost immediately.  Within 20 minutes, blood pressure decreases. Your pulse returns to its normal level.  After 8 hours, carbon monoxide levels in the blood return to normal. Your oxygen level  increases.  After 24 hours, the chance of having a heart attack starts to decrease. Your breath, hair, and body stop smelling like smoke.  After 48 hours, damaged nerve endings begin to recover. Your sense of taste and smell improve.  After 72 hours, the body is virtually free of nicotine. Your bronchial tubes relax and breathing becomes easier.  After 2 to 12 weeks, lungs can hold more air. Exercise becomes easier and circulation improves.  The risk of having a heart attack, stroke, cancer, or lung disease is greatly reduced.  After 1 year, the risk of coronary heart disease is cut in half.  After 5 years, the risk of stroke falls to the same as a nonsmoker.  After 10 years, the risk of lung cancer is cut in half and the risk of other cancers decreases significantly.  After 15 years, the risk of coronary heart disease drops, usually to the level of a nonsmoker.  If you are pregnant, quitting smoking will improve your chances of having a healthy baby.  The people you live with, especially any children, will be healthier.  You will have extra money to spend on things other than cigarettes. QUESTIONS TO THINK ABOUT BEFORE ATTEMPTING TO QUIT You may want to talk about your answers with your caregiver.  Why do you want to quit?  If you tried to quit in the past, what helped and what  did not?  What will be the most difficult situations for you after you quit? How will you plan to handle them?  Who can help you through the tough times? Your family? Friends? A caregiver?  What pleasures do you get from smoking? What ways can you still get pleasure if you quit? Here are some questions to ask your caregiver:  How can you help me to be successful at quitting?  What medicine do you think would be best for me and how should I take it?  What should I do if I need more help?  What is smoking withdrawal like? How can I get information on withdrawal? GET READY  Set a quit  date.  Change your environment by getting rid of all cigarettes, ashtrays, matches, and lighters in your home, car, or work. Do not let people smoke in your home.  Review your past attempts to quit. Think about what worked and what did not. GET SUPPORT AND ENCOURAGEMENT You have a better chance of being successful if you have help. You can get support in many ways.  Tell your family, friends, and co-workers that you are going to quit and need their support. Ask them not to smoke around you.  Get individual, group, or telephone counseling and support. Programs are available at [facility]. Call your local health department for information about programs in your area.  Spiritual beliefs and practices may help some smokers quit.  Download a "quit meter" on your computer to keep track of quit statistics, such as how long you have gone without smoking, cigarettes not smoked, and money saved.  Get a self-help book about quitting smoking and staying off of tobacco. LEARN NEW SKILLS AND BEHAVIORS  Distract yourself from urges to smoke. Talk to someone, go for a walk, or occupy your time with a task.  Change your normal routine. Take a different route to work. Drink tea instead of coffee. Eat breakfast in a different place.  Reduce your stress. Take a hot bath, exercise, or read a book.  Plan something enjoyable to do every day. Reward yourself for not smoking.  Explore interactive web-based programs that specialize in helping you quit. GET MEDICINE AND USE IT CORRECTLY Medicines can help you stop smoking and decrease the urge to smoke. Combining medicine with the above behavioral methods and support can greatly increase your chances of successfully quitting smoking.  Nicotine replacement therapy helps deliver nicotine to your body without the negative effects and risks of smoking. Nicotine replacement therapy includes nicotine gum, lozenges, inhalers, nasal sprays, and  skin patches. Some may be available over-the-counter and others require a prescription.  Antidepressant medicine helps people abstain from smoking, but how this works is unknown. This medicine is available by prescription.  Nicotinic receptor partial agonist medicine simulates the effect of nicotine in your brain. This medicine is available by prescription. Ask your caregiver for advice about which medicines to use and how to use them based on your health history. Your caregiver will tell you what side effects to look out for if you choose to be on a medicine or therapy. Carefully read the information on the package. Do not use any other product containing nicotine while using a nicotine replacement product.  RELAPSE OR DIFFICULT SITUATIONS Most relapses occur within the first 3 months after quitting. Do not be discouraged if you start smoking again. Remember, most people try several times before finally quitting. You may have symptoms of withdrawal because your  body is used to nicotine. You may crave cigarettes, be irritable, feel very hungry, cough often, get headaches, or have difficulty concentrating. The withdrawal symptoms are only temporary. They are strongest when you first quit, but they will go away within 10-14 days. To reduce the chances of relapse, try to:  Avoid drinking alcohol. Drinking lowers your chances of successfully quitting.  Reduce the amount of caffeine you consume. Once you quit smoking, the amount of caffeine in your body increases and can give you symptoms, such as a rapid heartbeat, sweating, and anxiety.  Avoid smokers because they can make you want to smoke.  Do not let weight gain distract you. Many smokers will gain weight when they quit, usually less than 10 pounds. Eat a healthy diet and stay active. You can always lose the weight gained after you quit.  Find ways to improve your mood other than smoking. FOR MORE INFORMATION  www.smokefree.gov  Document  Released: 07/23/2001 Document Revised: 01/28/2012 Document Reviewed: 11/07/2011 [facility] Patient Information 2015 [place], [place]. This information is not intended to replace advice given to you by your health care provider. Make sure you discuss any questions you have with your health care provider.  Hypertension Hypertension, commonly called high blood pressure, is when the force of blood pumping through your arteries is too strong. Your arteries are the blood vessels that carry blood from your heart throughout your body. A blood pressure reading consists of a higher number over a lower number, such as 110/72. The higher number (systolic) is the pressure inside your arteries when your heart pumps. The lower number (diastolic) is the pressure inside your arteries when your heart relaxes. Ideally you want your blood pressure below 120/80. Hypertension forces your heart to work harder to pump blood. Your arteries may become narrow or stiff. Having hypertension puts you at risk for heart disease, stroke, and other problems.  RISK FACTORS Some risk factors for high blood pressure are controllable. Others are not.  Risk factors you cannot control include:   Race. You may be at higher risk if you are African American.  Age. Risk increases with age.  Gender. Men are at higher risk than women before age 35 years. After age 42, women are at higher risk than men. Risk factors you can control include:  Not getting enough exercise or physical activity.  Being overweight.  Getting too much fat, sugar, calories, or salt in your diet.  Drinking too much alcohol. SIGNS AND SYMPTOMS Hypertension does not usually cause signs or symptoms. Extremely high blood pressure (hypertensive crisis) may cause headache, anxiety, shortness of breath, and nosebleed. DIAGNOSIS  To check if you have hypertension, your health care provider will measure your blood pressure while you are seated, with your arm held at the  level of your heart. It should be measured at least twice using the same arm. Certain conditions can cause a difference in blood pressure between your right and left arms. A blood pressure reading that is higher than normal on one occasion does not mean that you need treatment. If one blood pressure reading is high, ask your health care provider about having it checked again. TREATMENT  Treating high blood pressure includes making lifestyle changes and possibly taking medication. Living a healthy lifestyle can help lower high blood pressure. You may need to change some of your habits. Lifestyle changes may include:  Following the DASH diet. This diet is high in fruits, vegetables, and whole grains. It is low in salt, red  meat, and added sugars.  Getting at least 2 1/2 hours of brisk physical activity every week.  Losing weight if necessary.  Not smoking.  Limiting alcoholic beverages.  Learning ways to reduce stress. If lifestyle changes are not enough to get your blood pressure under control, your health care provider may prescribe medicine. You may need to take more than one. Work closely with your health care provider to understand the risks and benefits. HOME CARE INSTRUCTIONS  Have your blood pressure rechecked as directed by your health care provider.   Only take medicine as directed by your health care provider. Follow the directions carefully. Blood pressure medicines must be taken as prescribed. The medicine does not work as well when you skip doses. Skipping doses also puts you at risk for problems.   Do not smoke.   Monitor your blood pressure at home as directed by your health care provider. SEEK MEDICAL CARE IF:   You think you are having a reaction to medicines taken.  You have recurrent headaches or feel dizzy.  You have swelling in your ankles.  You have trouble with your vision. SEEK IMMEDIATE MEDICAL CARE IF:  You develop a severe headache or  confusion.  You have unusual weakness, numbness, or feel faint.  You have severe chest or abdominal pain.  You vomit repeatedly.  You have trouble breathing. MAKE SURE YOU:   Understand these instructions.  Will watch your condition.  Will get help right away if you are not doing well or get worse. Document Released: 07/29/2005 Document Revised: 08/03/2013 Document Reviewed: 05/21/2013 [facility] Patient Information 2015 [place]. This information is not intended to replace advice given to you by your health care provider. Make sure you discuss any questions you have with your health care provider.

## 2014-02-10 NOTE — Assessment & Plan Note (Addendum)
BP Readings from Last 3 Encounters:  02/10/14 123/74  10/20/13 112/69  07/12/13 151/81    Lab Results  Component Value Date   NA 136 03/08/2013   K 3.9 03/08/2013   CREATININE 0.73 03/08/2013    Assessment: Blood pressure control: controlled Progress toward BP goal:  at goal Comments:none; no medications   Plan: Medications:  no medications currently, controlled  Educational resources provided: other (see comments) Self management tools provided: other (see comments) Other plans:

## 2014-02-11 ENCOUNTER — Encounter: Payer: Self-pay | Admitting: Internal Medicine

## 2014-02-14 ENCOUNTER — Other Ambulatory Visit: Payer: Self-pay | Admitting: Internal Medicine

## 2014-03-07 ENCOUNTER — Ambulatory Visit (INDEPENDENT_AMBULATORY_CARE_PROVIDER_SITE_OTHER): Payer: PRIVATE HEALTH INSURANCE | Admitting: Pharmacist

## 2014-03-07 DIAGNOSIS — Z7901 Long term (current) use of anticoagulants: Secondary | ICD-10-CM

## 2014-03-07 DIAGNOSIS — I6789 Other cerebrovascular disease: Secondary | ICD-10-CM

## 2014-03-07 LAB — POCT INR: INR: 2.3

## 2014-03-07 NOTE — Patient Instructions (Signed)
Patient instructed to take medications as defined in the Anti-coagulation Track section of this encounter.  Patient instructed to take today's dose.  Patient verbalized understanding of these instructions.    

## 2014-03-07 NOTE — Progress Notes (Signed)
Anti-Coagulation Progress Note  Robert FinlandMack Zuniga is a 68 y.o. male who is currently on an anti-coagulation regimen.    RECENT RESULTS: Recent results are below, the most recent result is correlated with a dose of 19.5 mg. per week: Lab Results  Component Value Date   INR 2.30 03/07/2014   INR 3.00 02/07/2014   INR 3.1 01/10/2014    ANTI-COAG DOSE: Anticoagulation Dose Instructions as of 03/07/2014     Glynis SmilesSun Mon Tue Wed Thu Fri Sat   New Dose 3 mg 1.5 mg 3 mg 3 mg 3 mg 3 mg 3 mg       ANTICOAG SUMMARY: Anticoagulation Episode Summary   Current INR goal 2.0-3.0  Next INR check 03/28/2014  INR from last check 2.30 (03/07/2014)  Weekly max dose   Target end date Indefinite  INR check location   Preferred lab   Send INR reminders to    Indications  CEREBROVASCULAR ACCIDENT ACUTE (Resolved) [436] Long term current use of anticoagulant [V58.61]        Comments         ANTICOAG TODAY: Anticoagulation Summary as of 03/07/2014   INR goal 2.0-3.0  Selected INR 2.30 (03/07/2014)  Next INR check 03/28/2014  Target end date Indefinite   Indications  CEREBROVASCULAR ACCIDENT ACUTE (Resolved) [436] Long term current use of anticoagulant [V58.61]      Anticoagulation Episode Summary   INR check location    Preferred lab    Send INR reminders to    Comments       PATIENT INSTRUCTIONS: Patient Instructions  Patient instructed to take medications as defined in the Anti-coagulation Track section of this encounter.  Patient instructed to take today's dose.  Patient verbalized understanding of these instructions.       FOLLOW-UP Return in 3 weeks (on 03/28/2014) for Follow up INR at 0845h.  Hulen LusterJames Elhadj Girton, III Pharm.D., CACP

## 2014-03-28 ENCOUNTER — Ambulatory Visit (INDEPENDENT_AMBULATORY_CARE_PROVIDER_SITE_OTHER): Payer: PRIVATE HEALTH INSURANCE | Admitting: Pharmacist

## 2014-03-28 DIAGNOSIS — Z7901 Long term (current) use of anticoagulants: Secondary | ICD-10-CM

## 2014-03-28 LAB — POCT INR: INR: 2.3

## 2014-03-28 NOTE — Progress Notes (Signed)
INTERNAL MEDICINE TEACHING ATTENDING ADDENDUM - Robert LagosNischal Tylerjames Newton M.D  Duration- indefinte, Indication- CVA, INR- therapeutic. Agree with Dr. Saralyn PilarGroce's recommendations as outlined in his note.

## 2014-03-28 NOTE — Progress Notes (Signed)
Anti-Coagulation Progress Note  Robert Newton is a 68 y.o. male who is currently on an anti-coagulation regimen.    RECENT RESULTS: Recent results are below, the most recent result is correlated with a dose of 19.5 mg. per week: Lab Results  Component Value Date   INR 2.30 03/28/2014   INR 2.30 03/07/2014   INR 3.00 02/07/2014    ANTI-COAG DOSE: Anticoagulation Dose Instructions as of 03/28/2014     Glynis SmilesSun Mon Tue Wed Thu Fri Sat   New Dose 3 mg 1.5 mg 3 mg 3 mg 3 mg 3 mg 3 mg       ANTICOAG SUMMARY: Anticoagulation Episode Summary   Current INR goal 2.0-3.0  Next INR check 04/25/2014  INR from last check 2.30 (03/28/2014)  Weekly max dose   Target end date Indefinite  INR check location   Preferred lab   Send INR reminders to    Indications  CEREBROVASCULAR ACCIDENT ACUTE (Resolved) [436] Long term current use of anticoagulant [V58.61]        Comments         ANTICOAG TODAY: Anticoagulation Summary as of 03/28/2014   INR goal 2.0-3.0  Selected INR 2.30 (03/28/2014)  Next INR check 04/25/2014  Target end date Indefinite   Indications  CEREBROVASCULAR ACCIDENT ACUTE (Resolved) [436] Long term current use of anticoagulant [V58.61]      Anticoagulation Episode Summary   INR check location    Preferred lab    Send INR reminders to    Comments       PATIENT INSTRUCTIONS: Patient Instructions  Patient instructed to take medications as defined in the Anti-coagulation Track section of this encounter.  Patient instructed to take today's dose.  Patient verbalized understanding of these instructions.       FOLLOW-UP Return in 4 weeks (on 04/25/2014) for Follow up INR at 0830h.  Hulen LusterJames Lorris Carducci, III Pharm.D., CACP

## 2014-03-28 NOTE — Patient Instructions (Signed)
Patient instructed to take medications as defined in the Anti-coagulation Track section of this encounter.  Patient instructed to take today's dose.  Patient verbalized understanding of these instructions.    

## 2014-04-25 ENCOUNTER — Ambulatory Visit (INDEPENDENT_AMBULATORY_CARE_PROVIDER_SITE_OTHER): Payer: PRIVATE HEALTH INSURANCE | Admitting: Pharmacist

## 2014-04-25 DIAGNOSIS — Z7901 Long term (current) use of anticoagulants: Secondary | ICD-10-CM

## 2014-04-25 LAB — POCT INR: INR: 2.5

## 2014-04-25 NOTE — Patient Instructions (Signed)
Patient instructed to take medications as defined in the Anti-coagulation Track section of this encounter.  Patient instructed to take today's dose.  Patient verbalized understanding of these instructions.    

## 2014-04-25 NOTE — Progress Notes (Signed)
Anti-Coagulation Progress Note  Robert Newton is a 69 y.o. male who is currently on an anti-coagulation regimen.    RECENT RESULTS: Recent results are below, the most recent result is correlated with a dose of 19.5 mg. per week: Lab Results  Component Value Date   INR 2.50 04/25/2014   INR 2.30 03/28/2014   INR 2.30 03/07/2014    ANTI-COAG DOSE: Anticoagulation Dose Instructions as of 04/25/2014     [name] Tue Wed Thu Fri Sat   New Dose 3 mg 1.5 mg 3 mg 3 mg 3 mg 3 mg 3 mg       ANTICOAG SUMMARY: Anticoagulation Episode Summary   Current INR goal 2.0-3.0  Next INR check 05/23/2014  INR from last check 2.50 (04/25/2014)  Weekly max dose   Target end date Indefinite  INR check location   Preferred lab   Send INR reminders to    Indications  CEREBROVASCULAR ACCIDENT ACUTE (Resolved) [436] Long term current use of anticoagulant [V58.61]        Comments         ANTICOAG TODAY: Anticoagulation Summary as of 04/25/2014   INR goal 2.0-3.0  Selected INR 2.50 (04/25/2014)  Next INR check 05/23/2014  Target end date Indefinite   Indications  CEREBROVASCULAR ACCIDENT ACUTE (Resolved) [436] Long term current use of anticoagulant [V58.61]      Anticoagulation Episode Summary   INR check location    Preferred lab    Send INR reminders to    Comments       PATIENT INSTRUCTIONS: Patient Instructions  Patient instructed to take medications as defined in the Anti-coagulation Track section of this encounter.  Patient instructed to take today's dose.  Patient verbalized understanding of these instructions.       FOLLOW-UP Return in 4 weeks (on 05/23/2014) for Follow up INR at 0845h.  Hulen Luster, III Pharm.D., CACP

## 2014-05-21 ENCOUNTER — Other Ambulatory Visit: Payer: Self-pay | Admitting: Internal Medicine

## 2014-05-23 ENCOUNTER — Ambulatory Visit (INDEPENDENT_AMBULATORY_CARE_PROVIDER_SITE_OTHER): Payer: PRIVATE HEALTH INSURANCE | Admitting: Pharmacist

## 2014-05-23 DIAGNOSIS — Z7901 Long term (current) use of anticoagulants: Secondary | ICD-10-CM

## 2014-05-23 LAB — POCT INR: INR: 2

## 2014-05-23 NOTE — Progress Notes (Signed)
Anti-Coagulation Progress Note  Robert Newton is a 68 y.o. male who reports to the clinic for monitoring of anticoagulation treatment.    RECENT RESULTS: Recent results are below, the most recent result is correlated with a dose of 19.5 mg. per week: Lab Results  Component Value Date   INR 2.0 05/23/2014   INR 2.50 04/25/2014   INR 2.30 03/28/2014    Weekly dose was unchanged   ANTI-COAG DOSE: INR as of 05/23/2014 and Previous Dosing Information   INR Dt INR Goal [facility] Tot Sun Mon Tue Wed Thu Fri Sat   05/23/2014 2.0 2.0-3.0 19.5 mg 3 mg 1.5 mg 3 mg 3 mg 3 mg 3 mg 3 mg    Anticoagulation Dose Instructions as of 05/23/2014     Total Sun Mon Tue Wed Thu Fri Sat   New Dose 19.5 mg 3 mg 1.5 mg 3 mg 3 mg 3 mg 3 mg 3 mg     (3 mg x 1)  (3 mg x 0.5)  (3 mg x 1)  (3 mg x 1)  (3 mg x 1)  (3 mg x 1)  (3 mg x 1)                           ANTICOAG SUMMARY: Anticoagulation Episode Summary   Current INR goal 2.0-3.0  Next INR check 06/13/2014  INR from last check 2.0 (05/23/2014)  Weekly max dose   Target end date Indefinite  INR check location   Preferred lab   Send INR reminders to    Indications  CEREBROVASCULAR ACCIDENT ACUTE (Resolved) [I67.89] Long term current use of anticoagulant [Z79.01]        Comments        PATIENT INSTRUCTIONS: Patient Instructions  Patient instructed to take medications as defined in the Anti-coagulation Track section of this encounter.  Patient instructed to take today's dose.  Patient verbalized understanding of these instructions.      FOLLOW-UP 06/13/14 Marzetta BoardJennifer Kim, PharmD BCPS, BCACP

## 2014-05-23 NOTE — Patient Instructions (Signed)
Patient instructed to take medications as defined in the Anti-coagulation Track section of this encounter.  Patient instructed to take today's dose.  Patient verbalized understanding of these instructions.    

## 2014-05-27 NOTE — Progress Notes (Signed)
On coumadin for CVA and carotid stenosis.  INR 2.0.  I have reviewed Dr. Elmyra RicksKim's note.

## 2014-05-30 ENCOUNTER — Ambulatory Visit (INDEPENDENT_AMBULATORY_CARE_PROVIDER_SITE_OTHER): Payer: PRIVATE HEALTH INSURANCE | Admitting: Internal Medicine

## 2014-05-30 ENCOUNTER — Encounter: Payer: Self-pay | Admitting: Internal Medicine

## 2014-05-30 VITALS — BP 140/82 | HR 73 | Temp 97.5°F | Ht 73.0 in | Wt 146.1 lb

## 2014-05-30 DIAGNOSIS — I10 Essential (primary) hypertension: Secondary | ICD-10-CM

## 2014-05-30 DIAGNOSIS — I6521 Occlusion and stenosis of right carotid artery: Secondary | ICD-10-CM

## 2014-05-30 DIAGNOSIS — Z7901 Long term (current) use of anticoagulants: Secondary | ICD-10-CM

## 2014-05-30 DIAGNOSIS — Z23 Encounter for immunization: Secondary | ICD-10-CM

## 2014-05-30 NOTE — Assessment & Plan Note (Signed)
INR usually with in therapeutic range. Follow up with Dr Saralyn PilarGroce's clinic

## 2014-05-30 NOTE — Assessment & Plan Note (Addendum)
No bleeding issues. Follow up with Dr Alexandria LodgeGroce.

## 2014-05-30 NOTE — Patient Instructions (Signed)
General Instructions: Please come back to see your regular doctor in 3 months We will give your a flu shot  Please bring your medicines with you each time you come to clinic.  Medicines may include prescription medications, over-the-counter medications, herbal remedies, eye drops, vitamins, or other pills.   Progress Toward Treatment Goals:  Treatment Goal 02/10/2014  Blood pressure at goal  Stop smoking smoking less    Self Care Goals & Plans:  Self Care Goal 05/30/2014  Manage my medications take my medicines as prescribed; bring my medications to every visit; refill my medications on time  Monitor my health -  Eat healthy foods drink diet soda or water instead of juice or soda; eat more vegetables; eat foods that are low in salt; eat baked foods instead of fried foods; eat fruit for snacks and desserts  Be physically active -  Stop smoking -  Meeting treatment goals -    No flowsheet data found.   Care Management & Community Referrals:  Referral 02/10/2014  Referrals made for care management support none needed  Referrals made to community resources none       \

## 2014-05-30 NOTE — Progress Notes (Signed)
Case discussed with Dr. Kazibwe soon after the resident saw the patient.  We reviewed the resident's history and exam and pertinent patient test results.  I agree with the assessment, diagnosis, and plan of care documented in the resident's note. 

## 2014-05-30 NOTE — Assessment & Plan Note (Signed)
BP Readings from Last 3 Encounters:  05/30/14 140/82  02/10/14 123/74  10/20/13 112/69    Lab Results  Component Value Date   NA 136 02/10/2014   K 3.8 02/10/2014   CREATININE 0.80 02/10/2014    Assessment: Blood pressure control:   Progress toward BP goal:    Comments: at goal. Compliant with medications.   Plan: Medications:  continue current medications Educational resources provided:   Self management tools provided:   Other plans: follow up with PCP

## 2014-05-30 NOTE — Progress Notes (Signed)
Patient ID: Robert Newton, male   DOB: [birth year], 68 y.o.   MRN: [id number]   Subjective:   HPI: [name] is a 68 y.o. 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    Reason(s) for this visit: 1. Routine follow up visit. No complaints today. Has been feeling well lately. Requests for a flu shot.   Kindly see the A&P for the status of the pt's chronic medical problems.     ROS: Constitutional: Denies fever, chills, diaphoresis, appetite change and fatigue.  Respiratory: Denies SOB, DOE, cough, chest tightness, and wheezing. Denies chest pain. CVS: No chest pain, palpitations and leg swelling.  GI: No abdominal pain, nausea, vomiting, bloody stools GU: No dysuria, frequency, hematuria, or flank pain.  MSK: No myalgias, back pain, joint swelling, arthralgias  Psych: No depression symptoms. No SI or SA.    Objective:  Physical Exam: Filed Vitals:   05/30/14 0847  BP: 140/82  Pulse: 73  Temp: 97.5 F (36.4 C)  TempSrc: Oral  Height: 6\' 1"  (1.854 m)  Weight: 146 lb 1.6 oz (66.271 kg)  SpO2: 100%   General: Well nourished. No acute distress.  HEENT: Normal oral mucosa. MMM.  Lungs: CTA bilaterally. Heart: RRR; no extra sounds or murmurs  Abdomen: Non-distended, normal bowel sounds, soft, nontender; no hepatosplenomegaly  Extremities: No pedal edema. No joint swelling or tenderness. Neurologic: Normal EOM,  Alert and oriented x3. Residual left hemiparesis from prior stroke 4/5. Mild left hand contracture. Uses both WC and cane. Has cane today.   Assessment & Plan:  Discussed case with my attending in the clinic, Dr. Josem KaufmannKlima See problem based charting.

## 2014-06-13 ENCOUNTER — Ambulatory Visit (INDEPENDENT_AMBULATORY_CARE_PROVIDER_SITE_OTHER): Payer: PRIVATE HEALTH INSURANCE | Admitting: Pharmacist

## 2014-06-13 DIAGNOSIS — Z7901 Long term (current) use of anticoagulants: Secondary | ICD-10-CM

## 2014-06-13 LAB — POCT INR: INR: 2.3

## 2014-06-13 NOTE — Patient Instructions (Signed)
Patient instructed to take medications as defined in the Anti-coagulation Track section of this encounter.  Patient instructed to take today's dose.  Patient verbalized understanding of these instructions.    

## 2014-06-13 NOTE — Progress Notes (Signed)
Anti-Coagulation Progress Note  Robert Newton is a 68 y.o. male who is currently on an anti-coagulation regimen.    RECENT RESULTS: Recent results are below, the most recent result is correlated with a dose of 19.5 mg. per week: Lab Results  Component Value Date   INR 2.0 05/23/2014   INR 2.50 04/25/2014   INR 2.30 03/28/2014    ANTI-COAG DOSE: Anticoagulation Dose Instructions as of 06/13/2014      Glynis SmilesSun Mon Tue Wed Thu Fri Sat   New Dose 3 mg 3 mg 3 mg 3 mg 3 mg 3 mg 3 mg       ANTICOAG SUMMARY: Anticoagulation Episode Summary    Current INR goal 2.0-3.0  Next INR check 07/11/2014  INR from last check 2.0 (05/23/2014)  Weekly max dose   Target end date Indefinite  INR check location   Preferred lab   Send INR reminders to    Indications  CEREBROVASCULAR ACCIDENT ACUTE (Resolved) [I67.89] Long term current use of anticoagulant [Z79.01]        Comments         ANTICOAG TODAY: Anticoagulation Summary as of 06/13/2014    INR goal 2.0-3.0  Selected INR 2.0 (05/23/2014)  Next INR check 07/11/2014  Target end date Indefinite   Indications  CEREBROVASCULAR ACCIDENT 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  Patient instructed to take today's dose.  Patient verbalized understanding of these instructions.       FOLLOW-UP Return in 4 weeks (on 07/11/2014) for Follow up INR at 0900h.  Hulen LusterJames Kapil Petropoulos, III Pharm.D., CACP

## 2014-07-01 ENCOUNTER — Encounter: Payer: Self-pay | Admitting: Internal Medicine

## 2014-07-11 ENCOUNTER — Ambulatory Visit: Payer: PRIVATE HEALTH INSURANCE

## 2014-07-18 ENCOUNTER — Encounter: Payer: Self-pay | Admitting: Internal Medicine

## 2014-07-18 ENCOUNTER — Other Ambulatory Visit (INDEPENDENT_AMBULATORY_CARE_PROVIDER_SITE_OTHER): Payer: PRIVATE HEALTH INSURANCE

## 2014-07-18 DIAGNOSIS — Z7901 Long term (current) use of anticoagulants: Secondary | ICD-10-CM

## 2014-07-18 LAB — POCT INR: INR: 3.5

## 2014-07-18 NOTE — Patient Instructions (Addendum)
Take 1/2 of a 3 mg warfarin tablet today.  Change warfarin dose back to previous dose as follows: Take1/2 of a 3 mg tablet on Mondays. Take one 3 mg tablet on other days.    We will notify Dr. Alexandria LodgeGroce who will provide further instructions.  Please return to see Dr. Alexandria LodgeGroce in 1 week.

## 2014-07-18 NOTE — Progress Notes (Signed)
Results reviewed by Attending,  Dr. Bertha Stakes. Dr Marinda Elk spoke with [name]. Instructions given to patient. Patient instructed to make an appointment on Monday, 07-25-2014, [facility], Dr Elie Confer. I have notified Dr Jorene Guest by e-mail 07-18-14 [phone] am  Maryan Rued, PBT [facility]

## 2014-07-18 NOTE — Progress Notes (Signed)
[name] came in to clinic for INR check today; Dr. Alexandria LodgeGroce is not available.  Patient's warfarin dose was increased from 19.5 mg weekly to 21 mg weekly on 11/02 (INR then was 2.3), and he reports that he has been taking it as prescribed (one 3 mg tablet daily).  He denies any change in diet or other medications.  He has no complaints, and specifically no symptoms to suggest a bleeding complication of warfarin.  Review of his chart shows that his INRs were usually therapeutic on warfarin 19.5 mg weekly.  I advised patient to take 1/2 of a 3 mg tablet today, and then resume his previous dose of 3 mg on Tuesday-Sunday and 1.5 mg on Monday.  I advised him to follow up with Dr. Alexandria LodgeGroce in 1 week.  I will notify Dr. Alexandria LodgeGroce so he can review and modify this plan if needed.

## 2014-07-25 ENCOUNTER — Ambulatory Visit (INDEPENDENT_AMBULATORY_CARE_PROVIDER_SITE_OTHER): Payer: PRIVATE HEALTH INSURANCE | Admitting: Pharmacist

## 2014-07-25 DIAGNOSIS — Z7901 Long term (current) use of anticoagulants: Secondary | ICD-10-CM

## 2014-07-25 LAB — POCT INR: INR: 3

## 2014-07-25 NOTE — Patient Instructions (Signed)
Patient instructed to take medications as defined in the Anti-coagulation Track section of this encounter.  Patient instructed to take today's dose.  Patient verbalized understanding of these instructions.    

## 2014-07-25 NOTE — Progress Notes (Signed)
Anti-Coagulation Progress Note  Robert Newton is a 68 y.o. male who is currently on an anti-coagulation regimen.    RECENT RESULTS: Recent results are below, the most recent result is correlated with a dose of 21 mg. per week: Lab Results  Component Value Date   INR 3.00 07/25/2014   INR 3.5 07/18/2014   INR 2.30 06/13/2014    ANTI-COAG DOSE: Anticoagulation Dose Instructions as of 07/25/2014      Glynis SmilesSun Mon Tue Wed Thu Fri Sat   New Dose 3 mg 1.5 mg 3 mg 3 mg 1.5 mg 3 mg 3 mg       ANTICOAG SUMMARY: Anticoagulation Episode Summary    Current INR goal 2.0-3.0  Next INR check 08/22/2014  INR from last check 3.00 (07/25/2014)  Weekly max dose   Target end date Indefinite  INR check location   Preferred lab   Send INR reminders to    Indications  CEREBROVASCULAR ACCIDENT ACUTE (Resolved) [I67.89] Long term current use of anticoagulant [Z79.01]        Comments         ANTICOAG TODAY: Anticoagulation Summary as of 07/25/2014    INR goal 2.0-3.0  Selected INR 3.00 (07/25/2014)  Next INR check 08/22/2014  Target end date Indefinite   Indications  CEREBROVASCULAR ACCIDENT 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  Patient instructed to take today's dose.  Patient verbalized understanding of these instructions.       FOLLOW-UP Return in 4 weeks (on 08/22/2014) for Follow up INR at 0900h.  Hulen LusterJames Tanaysha Alkins, III Pharm.D., CACP

## 2014-07-26 NOTE — Progress Notes (Signed)
Robert Newton is on anticoagulation for CVA and carotid stenosis.  His INR was 3 and his coumadin was decreased.  I have reviewed Dr. Saralyn PilarGroce's note.

## 2014-08-22 ENCOUNTER — Ambulatory Visit (INDEPENDENT_AMBULATORY_CARE_PROVIDER_SITE_OTHER): Payer: Medicare Other | Admitting: Pharmacist

## 2014-08-22 ENCOUNTER — Encounter: Payer: Self-pay | Admitting: Licensed Clinical Social Worker

## 2014-08-22 DIAGNOSIS — Z7901 Long term (current) use of anticoagulants: Secondary | ICD-10-CM

## 2014-08-22 LAB — POCT INR: INR: 2.2

## 2014-08-22 NOTE — Progress Notes (Signed)
Anti-Coagulation Progress Note  Robert Newton is a 69 y.o. male who is currently on an anti-coagulation regimen.    RECENT RESULTS: Recent results are below, the most recent result is correlated with a dose of 18 mg. per week: Lab Results  Component Value Date   INR 2.20 08/22/2014   INR 3.00 07/25/2014   INR 3.5 07/18/2014    ANTI-COAG DOSE: Anticoagulation Dose Instructions as of 08/22/2014      Glynis SmilesSun Mon Tue Wed Thu Fri Sat   New Dose 3 mg 1.5 mg 3 mg 3 mg 1.5 mg 3 mg 3 mg       ANTICOAG SUMMARY: Anticoagulation Episode Summary    Current INR goal 2.0-3.0  Next INR check 09/19/2014  INR from last check 2.20 (08/22/2014)  Weekly max dose   Target end date Indefinite  INR check location   Preferred lab   Send INR reminders to    Indications  CEREBROVASCULAR ACCIDENT ACUTE (Resolved) [I67.89] Long term current use of anticoagulant [Z79.01]        Comments         ANTICOAG TODAY: Anticoagulation Summary as of 08/22/2014    INR goal 2.0-3.0  Selected INR 2.20 (08/22/2014)  Next INR check 09/19/2014  Target end date Indefinite   Indications  CEREBROVASCULAR ACCIDENT 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  Patient instructed to take today's dose.  Patient verbalized understanding of these instructions.       FOLLOW-UP Return in 4 weeks (on 09/19/2014) for Follow up INR at 0900h.  Hulen LusterJames Groce, III Pharm.D., CACP

## 2014-08-22 NOTE — Patient Instructions (Signed)
Patient instructed to take medications as defined in the Anti-coagulation Track section of this encounter.  Patient instructed to take today's dose.  Patient verbalized understanding of these instructions.    

## 2014-08-22 NOTE — Progress Notes (Signed)
Robert Newton requesting information on obtaining dentures.  CSW explored pt's insurance benefits and contacted pt's [facility] plan.  CSW informed by [facility] representative T. Shanon Brow, pt does not have coverage for dentures.  CSW met with Robert Newton and notified.  Discussed several agencies that provide dentures services, ie [employer].  CSW notified Robert Newton, will gather information and mail contact information for agencies.

## 2014-08-22 NOTE — Progress Notes (Signed)
Indication: Carotid stenosis, Neurosurgery's recommendation. Duration: Indefinite. INR: At target. Agree with Dr. Groce's assessment and plan. 

## 2014-09-12 ENCOUNTER — Other Ambulatory Visit: Payer: Self-pay | Admitting: Internal Medicine

## 2014-09-19 ENCOUNTER — Ambulatory Visit (INDEPENDENT_AMBULATORY_CARE_PROVIDER_SITE_OTHER): Payer: Medicare Other | Admitting: Pharmacist

## 2014-09-19 DIAGNOSIS — Z7901 Long term (current) use of anticoagulants: Secondary | ICD-10-CM | POA: Diagnosis not present

## 2014-09-19 LAB — POCT INR: INR: 2.3

## 2014-09-19 NOTE — Patient Instructions (Signed)
Patient instructed to take medications as defined in the Anti-coagulation Track section of this encounter.  Patient instructed to take today's dose.  Patient verbalized understanding of these instructions.    

## 2014-09-19 NOTE — Progress Notes (Signed)
INTERNAL MEDICINE TEACHING ATTENDING ADDENDUM - Earl LagosNischal Dodie Parisi M.D  Duration- indefinite, Indication- CVA, carotid stenosis, INR- therapeutic. Agree with Dr. Saralyn PilarGroce's recommendations as outlined in his note.

## 2014-09-19 NOTE — Progress Notes (Signed)
Anti-Coagulation Progress Note  Merilynn FinlandMack Nickerson is a 69 y.o. male who is currently on an anti-coagulation regimen.    RECENT RESULTS: Recent results are below, the most recent result is correlated with a dose of 18 mg. per week: Lab Results  Component Value Date   INR 2.30 09/19/2014   INR 2.20 08/22/2014   INR 3.00 07/25/2014    ANTI-COAG DOSE: Anticoagulation Dose Instructions as of 09/19/2014      Glynis SmilesSun Mon Tue Wed Thu Fri Sat   New Dose 3 mg 1.5 mg 3 mg 3 mg 1.5 mg 3 mg 3 mg       ANTICOAG SUMMARY: Anticoagulation Episode Summary    Current INR goal 2.0-3.0  Next INR check 10/17/2014  INR from last check 2.30 (09/19/2014)  Weekly max dose   Target end date Indefinite  INR check location   Preferred lab   Send INR reminders to    Indications  CEREBROVASCULAR ACCIDENT ACUTE (Resolved) [I67.89] Long term current use of anticoagulant [Z79.01]        Comments         ANTICOAG TODAY: Anticoagulation Summary as of 09/19/2014    INR goal 2.0-3.0  Selected INR 2.30 (09/19/2014)  Next INR check 10/17/2014  Target end date Indefinite   Indications  CEREBROVASCULAR ACCIDENT 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  Patient instructed to take today's dose.  Patient verbalized understanding of these instructions.       FOLLOW-UP Return in 4 weeks (on 10/17/2014) for Follow up INR at 0900h.  Hulen LusterJames Adrik Khim, III Pharm.D., CACP

## 2014-10-13 ENCOUNTER — Telehealth: Payer: Self-pay | Admitting: Pharmacist

## 2014-10-13 NOTE — Telephone Encounter (Signed)
Call to patient to confirm appointment for 10/17/14 at 9:00 lmtcb

## 2014-10-14 ENCOUNTER — Other Ambulatory Visit: Payer: Self-pay | Admitting: Internal Medicine

## 2014-10-17 ENCOUNTER — Ambulatory Visit (INDEPENDENT_AMBULATORY_CARE_PROVIDER_SITE_OTHER): Payer: Medicare Other | Admitting: Pharmacist

## 2014-10-17 DIAGNOSIS — Z7901 Long term (current) use of anticoagulants: Secondary | ICD-10-CM | POA: Diagnosis not present

## 2014-10-17 DIAGNOSIS — Z8673 Personal history of transient ischemic attack (TIA), and cerebral infarction without residual deficits: Secondary | ICD-10-CM | POA: Diagnosis not present

## 2014-10-17 DIAGNOSIS — I639 Cerebral infarction, unspecified: Secondary | ICD-10-CM

## 2014-10-17 LAB — POCT INR: INR: 2.3

## 2014-10-17 MED ORDER — WARFARIN SODIUM 3 MG PO TABS: ORAL_TABLET | ORAL | Status: DC

## 2014-10-17 NOTE — Patient Instructions (Signed)
Patient instructed to take medications as defined in the Anti-coagulation Track section of this encounter.  Patient instructed to take today's dose.  Patient verbalized understanding of these instructions.    

## 2014-10-17 NOTE — Addendum Note (Signed)
Addended by: Hulen LusterGROCE, Noely Kuhnle B on: 10/17/2014 10:29 AM   Modules accepted: Orders

## 2014-10-17 NOTE — Progress Notes (Signed)
Anti-Coagulation Progress Note  Robert Newton is a 69 y.o. male who is currently on an anti-coagulation regimen.    RECENT RESULTS: Recent results are below, the most recent result is correlated with a dose of 18 mg. per week: Lab Results  Component Value Date   INR 2.30 10/17/2014   INR 2.30 09/19/2014   INR 2.20 08/22/2014    ANTI-COAG DOSE: Anticoagulation Dose Instructions as of 10/17/2014      Glynis SmilesSun Mon Tue Wed Thu Fri Sat   New Dose 3 mg 1.5 mg 3 mg 3 mg 1.5 mg 3 mg 3 mg       ANTICOAG SUMMARY: Anticoagulation Episode Summary    Current INR goal 2.0-3.0  Next INR check 11/14/2014  INR from last check 2.30 (10/17/2014)  Weekly max dose   Target end date Indefinite  INR check location   Preferred lab   Send INR reminders to    Indications  CEREBROVASCULAR ACCIDENT ACUTE (Resolved) [I67.89] Long term current use of anticoagulant [Z79.01]        Comments         ANTICOAG TODAY: Anticoagulation Summary as of 10/17/2014    INR goal 2.0-3.0  Selected INR 2.30 (10/17/2014)  Next INR check 11/14/2014  Target end date Indefinite   Indications  CEREBROVASCULAR ACCIDENT 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  Patient instructed to take today's dose.  Patient verbalized understanding of these instructions.       FOLLOW-UP Return in 4 weeks (on 11/14/2014) for Follow up INR at 0845h.  Hulen LusterJames Neysha Criado, III Pharm.D., CACP

## 2014-10-25 ENCOUNTER — Encounter: Payer: Self-pay | Admitting: Family

## 2014-10-26 ENCOUNTER — Ambulatory Visit: Payer: PRIVATE HEALTH INSURANCE | Admitting: Family

## 2014-10-26 ENCOUNTER — Other Ambulatory Visit (HOSPITAL_COMMUNITY): Payer: PRIVATE HEALTH INSURANCE

## 2014-11-11 ENCOUNTER — Telehealth: Payer: Self-pay | Admitting: Pharmacist

## 2014-11-11 NOTE — Telephone Encounter (Signed)
Call to patient to confirm appointment for 11/14/14 at 8:30 lmtcb.

## 2014-11-14 ENCOUNTER — Telehealth: Payer: Self-pay | Admitting: Pharmacist

## 2014-11-14 ENCOUNTER — Encounter: Payer: Self-pay | Admitting: Pharmacist

## 2014-11-14 ENCOUNTER — Ambulatory Visit (INDEPENDENT_AMBULATORY_CARE_PROVIDER_SITE_OTHER): Payer: Medicare Other | Admitting: Pharmacist

## 2014-11-14 DIAGNOSIS — Z8673 Personal history of transient ischemic attack (TIA), and cerebral infarction without residual deficits: Secondary | ICD-10-CM

## 2014-11-14 DIAGNOSIS — Z7901 Long term (current) use of anticoagulants: Secondary | ICD-10-CM

## 2014-11-14 LAB — POCT INR: INR: 2.4

## 2014-11-14 MED ORDER — WARFARIN SODIUM 3 MG PO TABS: ORAL_TABLET | ORAL | Status: DC

## 2014-11-14 NOTE — Progress Notes (Signed)
Anti-Coagulation Progress Note  Robert FinlandMack Newton is a 69 y.o. male who is currently on an anti-coagulation regimen.    RECENT RESULTS: Recent results are below, the most recent result is correlated with a dose of 18 mg. per week: Lab Results  Component Value Date   INR 2.40 11/14/2014   INR 2.30 10/17/2014   INR 2.30 09/19/2014    ANTI-COAG DOSE: Anticoagulation Dose Instructions as of 11/14/2014      Glynis SmilesSun Mon Tue Wed Thu Fri Sat   New Dose 3 mg 1.5 mg 3 mg 3 mg 1.5 mg 3 mg 3 mg       ANTICOAG SUMMARY: Anticoagulation Episode Summary    Current INR goal 2.0-3.0  Next INR check 12/12/2014  INR from last check 2.40 (11/14/2014)  Weekly max dose   Target end date Indefinite  INR check location   Preferred lab   Send INR reminders to    Indications  CEREBROVASCULAR ACCIDENT ACUTE (Resolved) [I67.89] Long term current use of anticoagulant [Z79.01]        Comments         ANTICOAG TODAY: Anticoagulation Summary as of 11/14/2014    INR goal 2.0-3.0  Selected INR 2.40 (11/14/2014)  Next INR check 12/12/2014  Target end date Indefinite   Indications  CEREBROVASCULAR ACCIDENT 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  Patient instructed to take today's dose.  Patient verbalized understanding of these instructions.       FOLLOW-UP Return in about 4 weeks (around 12/12/2014) for Follow up INR at 0845h.  Hulen LusterJames Keyon Winnick, III Pharm.D., CACP

## 2014-11-14 NOTE — Patient Instructions (Signed)
Patient instructed to take medications as defined in the Anti-coagulation Track section of this encounter.  Patient instructed to take today's dose.  Patient verbalized understanding of these instructions.    

## 2014-11-14 NOTE — Telephone Encounter (Signed)
Requests refill authorization for his warfarin.  

## 2014-11-14 NOTE — Telephone Encounter (Signed)
It shows being refilled today.

## 2014-11-16 NOTE — Progress Notes (Signed)
I have reviewed Dr. Saralyn PilarGroce's note.  [name] is on anticoagulation for carotid artery stenosis per his chart.

## 2014-11-21 ENCOUNTER — Telehealth: Payer: Self-pay | Admitting: Pharmacist

## 2014-11-21 ENCOUNTER — Ambulatory Visit (INDEPENDENT_AMBULATORY_CARE_PROVIDER_SITE_OTHER): Payer: Medicare Other | Admitting: Pharmacist

## 2014-11-21 ENCOUNTER — Other Ambulatory Visit: Payer: Self-pay | Admitting: Internal Medicine

## 2014-11-21 DIAGNOSIS — Z7901 Long term (current) use of anticoagulants: Secondary | ICD-10-CM

## 2014-11-21 DIAGNOSIS — R319 Hematuria, unspecified: Secondary | ICD-10-CM

## 2014-11-21 LAB — URINE MICROSCOPIC-ADD ON

## 2014-11-21 LAB — URINALYSIS, ROUTINE W REFLEX MICROSCOPIC
Glucose, UA: NEGATIVE mg/dL
Ketones, ur: 15 mg/dL — AB
NITRITE: NEGATIVE
PH: 6 (ref 5.0–8.0)
Protein, ur: 30 mg/dL — AB
SPECIFIC GRAVITY, URINE: 1.025 (ref 1.005–1.030)
Urobilinogen, UA: 2 mg/dL — ABNORMAL HIGH (ref 0.0–1.0)

## 2014-11-21 LAB — POCT INR: INR: 2.2

## 2014-11-21 NOTE — Patient Instructions (Signed)
Patient instructed to take medications as defined in the Anti-coagulation Track section of this encounter.  Patient instructed to take today's dose.  Patient verbalized understanding of these instructions.    

## 2014-11-21 NOTE — Progress Notes (Signed)
Anti-Coagulation Progress Note  Merilynn FinlandMack Gherardi is a 69 y.o. male who is currently on an anti-coagulation regimen.    RECENT RESULTS: Recent results are below, the most recent result is correlated with a dose of 18 mg. per week:  Of note:  Patient awoke this AM and with first voided urine of the day reports that he saw blood in his urine. He DENIES the following: CVA pain or tenderness; nausea or vomiting; headache; abdominal pain or tenderness or fever. We collected a UA during this visit. It reveals gross blood in the collection device.  Lab Results  Component Value Date   INR 2.20 11/21/2014   INR 2.40 11/14/2014   INR 2.30 10/17/2014    ANTI-COAG DOSE: Anticoagulation Dose Instructions as of 11/21/2014      Glynis SmilesSun Mon Tue Wed Thu Fri Sat   New Dose 3 mg 1.5 mg 3 mg 3 mg 1.5 mg 3 mg 3 mg       ANTICOAG SUMMARY: Anticoagulation Episode Summary    Current INR goal 2.0-3.0  Next INR check 11/28/2014  INR from last check 2.20 (11/21/2014)  Weekly max dose   Target end date Indefinite  INR check location   Preferred lab   Send INR reminders to    Indications  CEREBROVASCULAR ACCIDENT ACUTE (Resolved) [I67.89] Long term current use of anticoagulant [Z79.01]        Comments         ANTICOAG TODAY: Anticoagulation Summary as of 11/21/2014    INR goal 2.0-3.0  Selected INR 2.20 (11/21/2014)  Next INR check 11/28/2014  Target end date Indefinite   Indications  CEREBROVASCULAR ACCIDENT 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  Patient instructed to take today's dose.  Patient verbalized understanding of these instructions.       FOLLOW-UP Return in 7 days (on 11/28/2014)  for Follow up INR at 0845h ALSO has appt. with Dr. Shirlee LatchMcLean.Hulen Luster.  James Groce, III Pharm.D., CACP

## 2014-11-21 NOTE — Telephone Encounter (Signed)
UA results at 4:04PM revealed [facility] urine dipstick large amount. Patient had to leave based upon transportation. Results of UA discussed with Dr. Dalphine HandingBhardwaj at 5:00PM. I have attempted to call the patient based upon telephone number on file. No answer. I will make another effort tonight from home. At time of discharge from waiting area based on transportation availability the patient was instructed in the event of continued or worsening blood in urine to [facility] or report to the ED or urgent care center. I will attempt to call him again tonight to reinforce these instructions.

## 2014-11-21 NOTE — Telephone Encounter (Signed)
Attempted to call from home to patient regarding his laboratory UA returned [facility]. Will attempt to call again tomorrow to reiterate instructions provided today when leaving: RTC or ED should blood in urine continue or worsen.

## 2014-11-24 ENCOUNTER — Telehealth: Payer: Self-pay | Admitting: Internal Medicine

## 2014-11-24 NOTE — Telephone Encounter (Signed)
Call to patient to confirm appointment for 11/28/14 at 8:45 no answer

## 2014-11-28 ENCOUNTER — Ambulatory Visit (INDEPENDENT_AMBULATORY_CARE_PROVIDER_SITE_OTHER): Payer: Medicare Other | Admitting: Pharmacist

## 2014-11-28 ENCOUNTER — Encounter: Payer: Self-pay | Admitting: Internal Medicine

## 2014-11-28 ENCOUNTER — Ambulatory Visit (INDEPENDENT_AMBULATORY_CARE_PROVIDER_SITE_OTHER): Payer: Medicare Other | Admitting: Internal Medicine

## 2014-11-28 VITALS — BP 132/73 | HR 81 | Temp 97.7°F | Ht 73.0 in | Wt 143.6 lb

## 2014-11-28 DIAGNOSIS — M79642 Pain in left hand: Secondary | ICD-10-CM | POA: Diagnosis not present

## 2014-11-28 DIAGNOSIS — Z299 Encounter for prophylactic measures, unspecified: Secondary | ICD-10-CM

## 2014-11-28 DIAGNOSIS — Z720 Tobacco use: Secondary | ICD-10-CM

## 2014-11-28 DIAGNOSIS — Z8673 Personal history of transient ischemic attack (TIA), and cerebral infarction without residual deficits: Secondary | ICD-10-CM

## 2014-11-28 DIAGNOSIS — I6529 Occlusion and stenosis of unspecified carotid artery: Secondary | ICD-10-CM

## 2014-11-28 DIAGNOSIS — I10 Essential (primary) hypertension: Secondary | ICD-10-CM | POA: Diagnosis not present

## 2014-11-28 DIAGNOSIS — Z418 Encounter for other procedures for purposes other than remedying health state: Secondary | ICD-10-CM | POA: Diagnosis not present

## 2014-11-28 DIAGNOSIS — K089 Disorder of teeth and supporting structures, unspecified: Secondary | ICD-10-CM

## 2014-11-28 DIAGNOSIS — Z7901 Long term (current) use of anticoagulants: Secondary | ICD-10-CM

## 2014-11-28 DIAGNOSIS — R319 Hematuria, unspecified: Secondary | ICD-10-CM | POA: Insufficient documentation

## 2014-11-28 DIAGNOSIS — E785 Hyperlipidemia, unspecified: Secondary | ICD-10-CM

## 2014-11-28 DIAGNOSIS — E559 Vitamin D deficiency, unspecified: Secondary | ICD-10-CM | POA: Diagnosis not present

## 2014-11-28 DIAGNOSIS — K088 Other specified disorders of teeth and supporting structures: Secondary | ICD-10-CM

## 2014-11-28 LAB — CBC WITH DIFFERENTIAL/PLATELET
BASOS ABS: 0 10*3/uL (ref 0.0–0.1)
BASOS PCT: 1 % (ref 0–1)
EOS PCT: 4 % (ref 0–5)
Eosinophils Absolute: 0.1 10*3/uL (ref 0.0–0.7)
HEMATOCRIT: 41.8 % (ref 39.0–52.0)
Hemoglobin: 14.2 g/dL (ref 13.0–17.0)
Lymphocytes Relative: 31 % (ref 12–46)
Lymphs Abs: 0.9 10*3/uL (ref 0.7–4.0)
MCH: 28.6 pg (ref 26.0–34.0)
MCHC: 34 g/dL (ref 30.0–36.0)
MCV: 84.3 fL (ref 78.0–100.0)
MONO ABS: 0.3 10*3/uL (ref 0.1–1.0)
MPV: 10.6 fL (ref 8.6–12.4)
Monocytes Relative: 11 % (ref 3–12)
Neutro Abs: 1.6 10*3/uL — ABNORMAL LOW (ref 1.7–7.7)
Neutrophils Relative %: 53 % (ref 43–77)
PLATELETS: 185 10*3/uL (ref 150–400)
RBC: 4.96 MIL/uL (ref 4.22–5.81)
RDW: 14.1 % (ref 11.5–15.5)
WBC: 3 10*3/uL — ABNORMAL LOW (ref 4.0–10.5)

## 2014-11-28 LAB — COMPLETE METABOLIC PANEL WITH GFR
ALK PHOS: 101 U/L (ref 39–117)
ALT: 10 U/L (ref 0–53)
AST: 18 U/L (ref 0–37)
Albumin: 3.8 g/dL (ref 3.5–5.2)
BUN: 17 mg/dL (ref 6–23)
CO2: 28 meq/L (ref 19–32)
CREATININE: 0.94 mg/dL (ref 0.50–1.35)
Calcium: 9 mg/dL (ref 8.4–10.5)
Chloride: 102 mEq/L (ref 96–112)
GFR, Est African American: >89 mL/min
GFR, Est Non African American: 84 mL/min
GLUCOSE: 82 mg/dL (ref 70–99)
Potassium: 3.4 mEq/L — ABNORMAL LOW (ref 3.5–5.3)
Sodium: 137 mEq/L (ref 135–145)
Total Bilirubin: 0.9 mg/dL (ref 0.2–1.2)
Total Protein: 7.9 g/dL (ref 6.0–8.3)

## 2014-11-28 LAB — TSH: TSH: 1.187 u[IU]/mL (ref 0.350–4.500)

## 2014-11-28 LAB — POCT INR: INR: 2.6

## 2014-11-28 NOTE — Assessment & Plan Note (Signed)
Counseled on smoking cessation. Pt not interested currently

## 2014-11-28 NOTE — Assessment & Plan Note (Signed)
Noted 4/11 will repeat UA If UA continues to be + will refer to urology

## 2014-11-28 NOTE — Assessment & Plan Note (Addendum)
colonoscopy due 12/2014 (Dr. Leone PayorGessner) will refer, prevnar 02/2015 Check CMET, CBC, vitamin D level (last 28 2008), TSH

## 2014-11-28 NOTE — Patient Instructions (Signed)
Patient instructed to take medications as defined in the Anti-coagulation Track section of this encounter.  Patient instructed to take today's dose.  Patient verbalized understanding of these instructions.    

## 2014-11-28 NOTE — Progress Notes (Signed)
INTERNAL MEDICINE TEACHING ATTENDING ADDENDUM - [name] M.D  Duration- indefinite, Indication- CVA, carotid stenosis, INR- therapeutic. Agree with Dr. Saralyn PilarGroce's recommendations as outlined in his note.

## 2014-11-28 NOTE — Assessment & Plan Note (Addendum)
Will check Xray to r/o osteoarthritis. Advised to exercise hand as this is side he had stroke on and has residual left hemiparesis w/o obvious contracture  Continue with Tylenol prn pain instead of Ibuprofen which will affect INR

## 2014-11-28 NOTE — Assessment & Plan Note (Signed)
BP Readings from Last 3 Encounters:  11/28/14 132/73  05/30/14 140/82  02/10/14 123/74    Lab Results  Component Value Date   NA 136 02/10/2014   K 3.8 02/10/2014   CREATININE 0.80 02/10/2014    Assessment: Blood pressure control: controlled Progress toward BP goal:  at goal Comments: none  Plan: Medications:  none Other plans: will check CMET today, lipid due 02/2015

## 2014-11-28 NOTE — Assessment & Plan Note (Signed)
Lipid due 02/2015

## 2014-11-28 NOTE — Assessment & Plan Note (Signed)
Referred to dental.

## 2014-11-28 NOTE — Patient Instructions (Signed)
General Instructions: Take care Follow up in 6 months sooner if needed  Try Tylenol for left hand pain instead of Advil.    Hematuria Hematuria is blood in your urine. It can be caused by a bladder infection, kidney infection, prostate infection, kidney stone, or cancer of your urinary tract. Infections can usually be treated with medicine, and a kidney stone usually will pass through your urine. If neither of these is the cause of your hematuria, further workup to find out the reason may be needed. It is very important that you tell your health care provider about any blood you see in your urine, even if the blood stops without treatment or happens without causing pain. Blood in your urine that happens and then stops and then happens again can be a symptom of a very serious condition. Also, pain is not a symptom in the initial stages of many urinary cancers. HOME CARE INSTRUCTIONS   Drink lots of fluid, 3-4 quarts a day. If you have been diagnosed with an infection, cranberry juice is especially recommended, in addition to large amounts of water.  Avoid caffeine, tea, and carbonated beverages because they tend to irritate the bladder.  Avoid alcohol because it may irritate the prostate.  Take all medicines as directed by your health care provider.  If you were prescribed an antibiotic medicine, finish it all even if you start to feel better.  If you have been diagnosed with a kidney stone, follow your health care provider's instructions regarding straining your urine to catch the stone.  Empty your bladder often. Avoid holding urine for long periods of time.  After a bowel movement, women should cleanse front to back. Use each tissue only once.  Empty your bladder before and after sexual intercourse if you are a male. SEEK MEDICAL CARE IF:  You develop back pain.  You have a fever.  You have a feeling of sickness in your stomach (nausea) or vomiting.  Your symptoms are not better  in 3 days. Return sooner if you are getting worse. SEEK IMMEDIATE MEDICAL CARE IF:   You develop severe vomiting and are unable to keep the medicine down.  You develop severe back or abdominal pain despite taking your medicines.  You begin passing a large amount of blood or clots in your urine.  You feel extremely weak or faint, or you pass out. MAKE SURE YOU:   Understand these instructions.  Will watch your condition.  Will get help right away if you are not doing well or get worse. Document Released: 07/29/2005 Document Revised: 12/13/2013 Document Reviewed: 03/29/2013 [facility] Patient Information 2015 [place], [place]. This information is not intended to replace advice given to you by your health care provider. Make sure you discuss any questions you have with your health care provider.    Treatment Goals:  Goals (1 Years of Data) as of 11/28/14          As of Today 05/30/14 02/10/14 10/20/13     Blood Pressure   . Blood Pressure < 140/90  132/73 140/82 123/74 112/69      Progress Toward Treatment Goals:  Treatment Goal 11/28/2014  Blood pressure at goal  Stop smoking smoking the same amount    Self Care Goals & Plans:  Self Care Goal 11/28/2014  Manage my medications take my medicines as prescribed; bring my medications to every visit; refill my medications on time; follow the sick day instructions if I am sick  Monitor my health keep track  of my blood pressure  Eat healthy foods drink diet soda or water instead of juice or soda; eat more vegetables; eat foods that are low in salt; eat baked foods instead of fried foods; eat fruit for snacks and desserts; eat smaller portions  Be physically active find an activity I enjoy  Stop smoking call [facility] (1-800-QUIT-NOW)  Meeting treatment goals maintain the current self-care plan    No flowsheet data found.   Care Management & Community Referrals:  Referral 11/28/2014  Referrals made for care management support none  needed  Referrals made to community resources none

## 2014-11-28 NOTE — Assessment & Plan Note (Signed)
Continue coumadin.  

## 2014-11-28 NOTE — Progress Notes (Signed)
   Subjective:    Patient ID: Robert Newton, male    DOB: 05/01/1946, 69 y.o.   MRN: [id number]  HPI Comments: 69 y.o pmh tobacco abuse, low back pain, HLD, HTN, ED, CVA with left hemiparesis, CAD on Coumadin  He presents for f/u 1. He c/o left hand pain 10/10 some days hurting sensation. Ibuprofen makes better, nothing makes worse, denies h/o trauma but is on the same side he had stroke on 2. HTN-BP controlled off meds 132/73  3. Tobacco abuse-smoking 1 ppd lasts 1-2 days. Not interested in quiting  4. Gross Hematuria resolved noticed 2 weeks ago and none since. Will repeat UA today  5. He c/o teeth cracking and breaking and no teeth on the right upper mouth  6. H/o CAD on Coumadin INR 2.20 on 4/11   HM-colonoscopy due 12/2014 (Dr. Leone PayorGessner), prevnar 02/2015  SH: wife deceased recently, son lives with him.       Review of Systems  Respiratory: Negative for shortness of breath.   Cardiovascular: Negative for chest pain and leg swelling.  Gastrointestinal: Negative for abdominal pain, constipation and blood in stool.  Genitourinary: Negative for dysuria and hematuria.  Musculoskeletal: Positive for arthralgias.       Objective:   Physical Exam  Constitutional: He is oriented to person, place, and time. Vital signs are normal. He appears well-developed and well-nourished. He is cooperative.  HENT:  Head: Normocephalic and atraumatic.  Mouth/Throat: Oropharynx is clear and moist and mucous membranes are normal. Abnormal dentition. Dental caries present. No oropharyngeal exudate.  Right upper mouth cracked teeth  Eyes: Conjunctivae are normal. Pupils are equal, round, and reactive to light. Right eye exhibits no discharge. Left eye exhibits no discharge. No scleral icterus.  Cardiovascular: Normal rate, regular rhythm, S1 normal, S2 normal and normal heart sounds.   No murmur heard. No lower ext edema   Pulmonary/Chest: Effort normal and breath sounds normal.  Abdominal: Soft. Bowel  sounds are normal. He exhibits no distension. There is no tenderness.  Musculoskeletal: He exhibits no edema.  Left hand no gross deformities or ttp   Neurological: He is alert and oriented to person, place, and time.  Good strength in upper ext 4+ left hand 5/5 right hand Walks with cane and limp dragging left foot   Skin: Skin is warm, dry and intact. No rash noted.  Psychiatric: He has a normal mood and affect. His speech is normal and behavior is normal. Judgment and thought content normal. Cognition and memory are normal.  Nursing note and vitals reviewed.         Assessment & Plan:  F/u in 6 months

## 2014-11-28 NOTE — Progress Notes (Signed)
Anti-Coagulation Progress Note  Robert Newton is a 69 y.o.Merilynn [place] male who is currently on an anti-coagulation regimen.    RECENT RESULTS: Recent results are below, the most recent result is correlated with a dose of 18 mg. per week: Lab Results  Component Value Date   INR 2.60 11/28/2014   INR 2.20 11/21/2014   INR 2.40 11/14/2014    ANTI-COAG DOSE: Anticoagulation Dose Instructions as of 11/28/2014      Glynis SmilesSun Mon Tue Wed Thu Fri Sat   New Dose 3 mg 1.5 mg 3 mg 1.5 mg 3 mg 1.5 mg 3 mg       ANTICOAG SUMMARY: Anticoagulation Episode Summary    Current INR goal 2.0-3.0  Next INR check 12/12/2014  INR from last check 2.60 (11/28/2014)  Weekly max dose   Target end date Indefinite  INR check location   Preferred lab   Send INR reminders to    Indications  CEREBROVASCULAR ACCIDENT ACUTE (Resolved) [I67.89] Long term current use of anticoagulant [Z79.01]        Comments         ANTICOAG TODAY: Anticoagulation Summary as of 11/28/2014    INR goal 2.0-3.0  Selected INR 2.60 (11/28/2014)  Next INR check 12/12/2014  Target end date Indefinite   Indications  CEREBROVASCULAR ACCIDENT 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  Patient instructed to take today's dose.  Patient verbalized understanding of these instructions.       FOLLOW-UP Return in 2 weeks (on 12/12/2014) for Follow up INR at 0845h.  Hulen LusterJames Angelicia Lessner, III Pharm.D., CACP

## 2014-11-29 LAB — URINALYSIS, MICROSCOPIC ONLY
BACTERIA UA: NONE SEEN
CASTS: NONE SEEN
CRYSTALS: NONE SEEN

## 2014-11-29 LAB — URINALYSIS, ROUTINE W REFLEX MICROSCOPIC
Glucose, UA: 100 mg/dL — AB
Ketones, ur: NEGATIVE mg/dL
LEUKOCYTES UA: NEGATIVE
Nitrite: NEGATIVE
Protein, ur: 30 mg/dL — AB
SPECIFIC GRAVITY, URINE: 1.022 (ref 1.005–1.030)
Urobilinogen, UA: 1 mg/dL (ref 0.0–1.0)
pH: 6 (ref 5.0–8.0)

## 2014-11-29 LAB — VITAMIN D 25 HYDROXY (VIT D DEFICIENCY, FRACTURES): Vit D, 25-Hydroxy: 14 ng/mL — ABNORMAL LOW (ref 30–100)

## 2014-11-29 NOTE — Progress Notes (Signed)
INTERNAL MEDICINE TEACHING ATTENDING ADDENDUM - Kylin Dubs, MD: I reviewed and discussed at the time of visit with the resident Dr. McLean, the patient's medical history, physical examination, diagnosis and results of pertinent tests and treatment and I agree with the patient's care as documented.  

## 2014-11-30 ENCOUNTER — Encounter: Payer: Self-pay | Admitting: Internal Medicine

## 2014-11-30 ENCOUNTER — Other Ambulatory Visit: Payer: Self-pay | Admitting: Internal Medicine

## 2014-11-30 DIAGNOSIS — R319 Hematuria, unspecified: Secondary | ICD-10-CM

## 2014-11-30 MED ORDER — VITAMIN D (ERGOCALCIFEROL) 1.25 MG (50000 UNIT) PO CAPS: 50000.0000 [IU] | ORAL_CAPSULE | ORAL | Status: DC

## 2014-12-02 ENCOUNTER — Encounter: Payer: Self-pay | Admitting: Internal Medicine

## 2014-12-05 ENCOUNTER — Encounter: Payer: Self-pay | Admitting: *Deleted

## 2014-12-12 ENCOUNTER — Ambulatory Visit (INDEPENDENT_AMBULATORY_CARE_PROVIDER_SITE_OTHER): Payer: Medicare Other | Admitting: Pharmacist

## 2014-12-12 DIAGNOSIS — Z8673 Personal history of transient ischemic attack (TIA), and cerebral infarction without residual deficits: Secondary | ICD-10-CM | POA: Diagnosis not present

## 2014-12-12 DIAGNOSIS — Z7901 Long term (current) use of anticoagulants: Secondary | ICD-10-CM

## 2014-12-12 LAB — POCT INR: INR: 2.2

## 2014-12-12 NOTE — Progress Notes (Signed)
Anti-Coagulation Progress Note  Robert Newton is a 69 y.o. male who is currently on an anti-coagulation regimen.    RECENT RESULTS: Recent results are below, the most recent result is correlated with a dose of 16.5 mg. per week: Lab Results  Component Value Date   INR 2.20 12/12/2014   INR 2.60 11/28/2014   INR 2.20 11/21/2014    ANTI-COAG DOSE: Anticoagulation Dose Instructions as of 12/12/2014      Glynis SmilesSun Mon Tue Wed Thu Fri Sat   New Dose 3 mg 1.5 mg 3 mg 3 mg 3 mg 1.5 mg 3 mg       ANTICOAG SUMMARY: Anticoagulation Episode Summary    Current INR goal 2.0-3.0  Next INR check 01/02/2015  INR from last check 2.20 (12/12/2014)  Weekly max dose   Target end date Indefinite  INR check location   Preferred lab   Send INR reminders to    Indications  CEREBROVASCULAR ACCIDENT ACUTE (Resolved) [I67.89] Long term current use of anticoagulant [Z79.01]        Comments         ANTICOAG TODAY: Anticoagulation Summary as of 12/12/2014    INR goal 2.0-3.0  Selected INR 2.20 (12/12/2014)  Next INR check 01/02/2015  Target end date Indefinite   Indications  CEREBROVASCULAR ACCIDENT 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  Patient instructed to take today's dose.  Patient verbalized understanding of these instructions.       FOLLOW-UP Return in 3 weeks (on 01/02/2015) for Follow up INR at 0845h.  Hulen LusterJames Violia Knopf, III Pharm.D., CACP

## 2014-12-12 NOTE — Progress Notes (Signed)
INTERNAL MEDICINE TEACHING ATTENDING ADDENDUM - Mylez Venable M.D  Duration- indefinite, Indication- CVA, carotid artery stenosis, INR- therapeutic. Agree with Dr. Saralyn PilarGroce's recommendations as outlined in his note.

## 2014-12-12 NOTE — Patient Instructions (Signed)
Patient instructed to take medications as defined in the Anti-coagulation Track section of this encounter.  Patient instructed to take today's dose.  Patient verbalized understanding of these instructions.    

## 2014-12-20 ENCOUNTER — Encounter: Payer: Self-pay | Admitting: Family

## 2014-12-21 ENCOUNTER — Ambulatory Visit (INDEPENDENT_AMBULATORY_CARE_PROVIDER_SITE_OTHER): Payer: Medicare Other | Admitting: Family

## 2014-12-21 ENCOUNTER — Encounter: Payer: Self-pay | Admitting: Family

## 2014-12-21 ENCOUNTER — Ambulatory Visit (HOSPITAL_COMMUNITY)
Admission: RE | Admit: 2014-12-21 | Discharge: 2014-12-21 | Disposition: A | Discharge status: Home / Self Care | Payer: Medicare Other | Source: Ambulatory Visit | Attending: Family | Admitting: Family

## 2014-12-21 VITALS — BP 142/82 | HR 67 | Resp 16 | Ht 73.0 in | Wt 138.0 lb

## 2014-12-21 DIAGNOSIS — Z720 Tobacco use: Secondary | ICD-10-CM

## 2014-12-21 DIAGNOSIS — Z8673 Personal history of transient ischemic attack (TIA), and cerebral infarction without residual deficits: Secondary | ICD-10-CM | POA: Diagnosis not present

## 2014-12-21 DIAGNOSIS — I6523 Occlusion and stenosis of bilateral carotid arteries: Secondary | ICD-10-CM | POA: Insufficient documentation

## 2014-12-21 DIAGNOSIS — F17200 Nicotine dependence, unspecified, uncomplicated: Secondary | ICD-10-CM

## 2014-12-21 NOTE — Patient Instructions (Signed)
Stroke Prevention Some medical conditions and behaviors are associated with an increased chance of having a stroke. You may prevent a stroke by making healthy choices and managing medical conditions. HOW CAN I REDUCE MY RISK OF HAVING A STROKE?   Stay physically active. Get at least 30 minutes of activity on most or all days.  Do not smoke. It may also be helpful to avoid exposure to secondhand smoke.  Limit alcohol use. Moderate alcohol use is considered to be:  No more than 2 drinks per day for men.  No more than 1 drink per day for nonpregnant women.  Eat healthy foods. This involves:  Eating 5 or more servings of fruits and vegetables a day.  Making dietary changes that address high blood pressure (hypertension), high cholesterol, diabetes, or obesity.  Manage your cholesterol levels.  Making food choices that are high in fiber and low in saturated fat, trans fat, and cholesterol may control cholesterol levels.  Take any prescribed medicines to control cholesterol as directed by your health care provider.  Manage your diabetes.  Controlling your carbohydrate and sugar intake is recommended to manage diabetes.  Take any prescribed medicines to control diabetes as directed by your health care provider.  Control your hypertension.  Making food choices that are low in salt (sodium), saturated fat, trans fat, and cholesterol is recommended to manage hypertension.  Take any prescribed medicines to control hypertension as directed by your health care provider.  Maintain a healthy weight.  Reducing calorie intake and making food choices that are low in sodium, saturated fat, trans fat, and cholesterol are recommended to manage weight.  Stop drug abuse.  Avoid taking birth control pills.  Talk to your health care provider about the risks of taking birth control pills if you are over 35 years old, smoke, get migraines, or have ever had a blood clot.  Get evaluated for sleep  disorders (sleep apnea).  Talk to your health care provider about getting a sleep evaluation if you snore a lot or have excessive sleepiness.  Take medicines only as directed by your health care provider.  For some people, aspirin or blood thinners (anticoagulants) are helpful in reducing the risk of forming abnormal blood clots that can lead to stroke. If you have the irregular heart rhythm of atrial fibrillation, you should be on a blood thinner unless there is a good reason you cannot take them.  Understand all your medicine instructions.  Make sure that other conditions (such as anemia or atherosclerosis) are addressed. SEEK IMMEDIATE MEDICAL CARE IF:   You have sudden weakness or numbness of the face, arm, or leg, especially on one side of the body.  Your face or eyelid droops to one side.  You have sudden confusion.  You have trouble speaking (aphasia) or understanding.  You have sudden trouble seeing in one or both eyes.  You have sudden trouble walking.  You have dizziness.  You have a loss of balance or coordination.  You have a sudden, severe headache with no known cause.  You have new chest pain or an irregular heartbeat. Any of these symptoms may represent a serious problem that is an emergency. Do not wait to see if the symptoms will go away. Get medical help at once. Call your local emergency services (911 in [place] Do not drive yourself to the hospital. Document Released: 09/05/2004 Document Revised: 12/13/2013 Document Reviewed: 01/29/2013 Exit[employer] Patient Information 2015 [employer]. This information is not intended to replace advice given   to you by your health care provider. Make sure you discuss any questions you have with your health care provider.    Smoking Cessation Quitting smoking is important to your health and has many advantages. However, it is not always easy to quit since nicotine is a very addictive drug. Oftentimes, people try 3 times or  more before being able to quit. This document explains the best ways for you to prepare to quit smoking. Quitting takes hard work and a lot of effort, but you can do it. ADVANTAGES OF QUITTING SMOKING  You will live longer, feel better, and live better.  Your body will feel the impact of quitting smoking almost immediately.  Within 20 minutes, blood pressure decreases. Your pulse returns to its normal level.  After 8 hours, carbon monoxide levels in the blood return to normal. Your oxygen level increases.  After 24 hours, the chance of having a heart attack starts to decrease. Your breath, hair, and body stop smelling like smoke.  After 48 hours, damaged nerve endings begin to recover. Your sense of taste and smell improve.  After 72 hours, the body is virtually free of nicotine. Your bronchial tubes relax and breathing becomes easier.  After 2 to 12 weeks, lungs can hold more air. Exercise becomes easier and circulation improves.  The risk of having a heart attack, stroke, cancer, or lung disease is greatly reduced.  After 1 year, the risk of coronary heart disease is cut in half.  After 5 years, the risk of stroke falls to the same as a nonsmoker.  After 10 years, the risk of lung cancer is cut in half and the risk of other cancers decreases significantly.  After 15 years, the risk of coronary heart disease drops, usually to the level of a nonsmoker.  If you are pregnant, quitting smoking will improve your chances of having a healthy baby.  The people you live with, especially any children, will be healthier.  You will have extra money to spend on things other than cigarettes. QUESTIONS TO THINK ABOUT BEFORE ATTEMPTING TO QUIT You may want to talk about your answers with your health care provider.  Why do you want to quit?  If you tried to quit in the past, what helped and what did not?  What will be the most difficult situations for you after you quit? How will you plan to  handle them?  Who can help you through the tough times? Your family? Friends? A health care provider?  What pleasures do you get from smoking? What ways can you still get pleasure if you quit? Here are some questions to ask your health care provider:  How can you help me to be successful at quitting?  What medicine do you think would be best for me and how should I take it?  What should I do if I need more help?  What is smoking withdrawal like? How can I get information on withdrawal? GET READY  Set a quit date.  Change your environment by getting rid of all cigarettes, ashtrays, matches, and lighters in your home, car, or work. Do not let people smoke in your home.  Review your past attempts to quit. Think about what worked and what did not. GET SUPPORT AND ENCOURAGEMENT You have a better chance of being successful if you have help. You can get support in many ways.  Tell your family, friends, and coworkers that you are going to quit and need their support. Ask them   not to smoke around you.  Get individual, group, or telephone counseling and support. Programs are available at local hospitals and health centers. Call your local health department for information about programs in your area.  Spiritual beliefs and practices may help some smokers quit.  Download a "quit meter" on your computer to keep track of quit statistics, such as how long you have gone without smoking, cigarettes not smoked, and money saved.  Get a self-help book about quitting smoking and staying off tobacco. LEARN NEW SKILLS AND BEHAVIORS  Distract yourself from urges to smoke. Talk to someone, go for a walk, or occupy your time with a task.  Change your normal routine. Take a different route to work. Drink tea instead of coffee. Eat breakfast in a different place.  Reduce your stress. Take a hot bath, exercise, or read a book.  Plan something enjoyable to do every day. Reward yourself for not  smoking.  Explore interactive web-based programs that specialize in helping you quit. GET MEDICINE AND USE IT CORRECTLY Medicines can help you stop smoking and decrease the urge to smoke. Combining medicine with the above behavioral methods and support can greatly increase your chances of successfully quitting smoking.  Nicotine replacement therapy helps deliver nicotine to your body without the negative effects and risks of smoking. Nicotine replacement therapy includes nicotine gum, lozenges, inhalers, nasal sprays, and skin patches. Some may be available over-the-counter and others require a prescription.  Antidepressant medicine helps people abstain from smoking, but how this works is unknown. This medicine is available by prescription.  Nicotinic receptor partial agonist medicine simulates the effect of nicotine in your brain. This medicine is available by prescription. Ask your health care provider for advice about which medicines to use and how to use them based on your health history. Your health care provider will tell you what side effects to look out for if you choose to be on a medicine or therapy. Carefully read the information on the package. Do not use any other product containing nicotine while using a nicotine replacement product.  RELAPSE OR DIFFICULT SITUATIONS Most relapses occur within the first 3 months after quitting. Do not be discouraged if you start smoking again. Remember, most people try several times before finally quitting. You may have symptoms of withdrawal because your body is used to nicotine. You may crave cigarettes, be irritable, feel very hungry, cough often, get headaches, or have difficulty concentrating. The withdrawal symptoms are only temporary. They are strongest when you first quit, but they will go away within 10-14 days. To reduce the chances of relapse, try to:  Avoid drinking alcohol. Drinking lowers your chances of successfully quitting.  Reduce the  amount of caffeine you consume. Once you quit smoking, the amount of caffeine in your body increases and can give you symptoms, such as a rapid heartbeat, sweating, and anxiety.  Avoid smokers because they can make you want to smoke.  Do not let weight gain distract you. Many smokers will gain weight when they quit, usually less than 10 pounds. Eat a healthy diet and stay active. You can always lose the weight gained after you quit.  Find ways to improve your mood other than smoking. FOR MORE INFORMATION  www.smokefree.gov  Document Released: 07/23/2001 Document Revised: 12/13/2013 Document Reviewed: 11/07/2011 ExitCare Patient Information 2015 [employer]. This information is not intended to replace advice given to you by your health care provider. Make sure you discuss any questions you have with your health   care provider.    Smoking Cessation, Tips for Success If you are ready to quit smoking, congratulations! You have chosen to help yourself be healthier. Cigarettes bring nicotine, tar, carbon monoxide, and other irritants into your body. Your lungs, heart, and blood vessels will be able to work better without these poisons. There are many different ways to quit smoking. Nicotine gum, nicotine patches, a nicotine inhaler, or nicotine nasal spray can help with physical craving. Hypnosis, support groups, and medicines help break the habit of smoking. WHAT THINGS CAN I DO TO MAKE QUITTING EASIER?  Here are some tips to help you quit for good:  Pick a date when you will quit smoking completely. Tell all of your friends and family about your plan to quit on that date.  Do not try to slowly cut down on the number of cigarettes you are smoking. Pick a quit date and quit smoking completely starting on that day.  Throw away all cigarettes.   Clean and remove all ashtrays from your home, work, and car.  On a card, write down your reasons for quitting. Carry the card with you and read it  when you get the urge to smoke.  Cleanse your body of nicotine. Drink enough water and fluids to keep your urine clear or pale yellow. Do this after quitting to flush the nicotine from your body.  Learn to predict your moods. Do not let a bad situation be your excuse to have a cigarette. Some situations in your life might tempt you into wanting a cigarette.  Never have "just one" cigarette. It leads to wanting another and another. Remind yourself of your decision to quit.  Change habits associated with smoking. If you smoked while driving or when feeling stressed, try other activities to replace smoking. Stand up when drinking your coffee. Brush your teeth after eating. Sit in a different chair when you read the paper. Avoid alcohol while trying to quit, and try to drink fewer caffeinated beverages. Alcohol and caffeine may urge you to smoke.  Avoid foods and drinks that can trigger a desire to smoke, such as sugary or spicy foods and alcohol.  Ask people who smoke not to smoke around you.  Have something planned to do right after eating or having a cup of coffee. For example, plan to take a walk or exercise.  Try a relaxation exercise to calm you down and decrease your stress. Remember, you may be tense and nervous for the first 2 weeks after you quit, but this will pass.  Find new activities to keep your hands busy. Play with a pen, coin, or rubber band. Doodle or draw things on paper.  Brush your teeth right after eating. This will help cut down on the craving for the taste of tobacco after meals. You can also try mouthwash.   Use oral substitutes in place of cigarettes. Try using lemon drops, carrots, cinnamon sticks, or chewing gum. Keep them handy so they are available when you have the urge to smoke.  When you have the urge to smoke, try deep breathing.  Designate your home as a nonsmoking area.  If you are a heavy smoker, ask your health care provider about a prescription for  nicotine chewing gum. It can ease your withdrawal from nicotine.  Reward yourself. Set aside the cigarette money you save and buy yourself something nice.  Look for support from others. Join a support group or smoking cessation program. Ask someone at home or at work   to help you with your plan to quit smoking.  Always ask yourself, "Do I need this cigarette or is this just a reflex?" Tell yourself, "Today, I choose not to smoke," or "I do not want to smoke." You are reminding yourself of your decision to quit.  Do not replace cigarette smoking with electronic cigarettes (commonly called e-cigarettes). The safety of e-cigarettes is unknown, and some may contain harmful chemicals.  If you relapse, do not give up! Plan ahead and think about what you will do the next time you get the urge to smoke. HOW WILL I FEEL WHEN I QUIT SMOKING? You may have symptoms of withdrawal because your body is used to nicotine (the addictive substance in cigarettes). You may crave cigarettes, be irritable, feel very hungry, cough often, get headaches, or have difficulty concentrating. The withdrawal symptoms are only temporary. They are strongest when you first quit but will go away within 10-14 days. When withdrawal symptoms occur, stay in control. Think about your reasons for quitting. Remind yourself that these are signs that your body is healing and getting used to being without cigarettes. Remember that withdrawal symptoms are easier to treat than the major diseases that smoking can cause.  Even after the withdrawal is over, expect periodic urges to smoke. However, these cravings are generally short lived and will go away whether you smoke or not. Do not smoke! WHAT RESOURCES ARE AVAILABLE TO HELP ME QUIT SMOKING? Your health care provider can direct you to community resources or hospitals for support, which may include:  Group support.  Education.  Hypnosis.  Therapy. Document Released: 04/26/2004 Document  Revised: 12/13/2013 Document Reviewed: 01/14/2013 ExitCare Patient Information 2015 [employer]. This information is not intended to replace advice given to you by your health care provider. Make sure you discuss any questions you have with your health care provider.  

## 2014-12-21 NOTE — Progress Notes (Signed)
Filed Vitals:   12/21/14 1058 12/21/14 1102 12/21/14 1107 12/21/14 1109  BP: 148/86 147/96 121/78 142/82  Pulse: 73 72 67   Resp:  16    Height:  6\' 1"  (1.854 m)    Weight:  138 lb (62.596 kg)    SpO2:  99%

## 2014-12-21 NOTE — Progress Notes (Signed)
Established Carotid Patient   History of Present Illness  Robert Newton is a 69 y.o. male patient of Dr. Edilia Boickson followed for known carotid stenosis. The patient does have greater than 80% ICA stenosis on the right but this is inoperable per Dr. Adele Danickson's progress note due to intracranial disease, the patient does have left-sided weakness from a previous stroke in 2004, no further stroke activity, per pt. He returns today for routine surveillance.  He denies any history of MI.  Patient has not had previous carotid artery intervention. He had a stroke in 2004 as manifested by left hemiparesis and slurred speech, denies visual changes, he has some residual left side weakness and slow speech. He denies any further stroke or TIA.  He denies claudication type symptoms with walking, denies non healing wounds.   Pt denies New Medical or Surgical History. His wife died November 2015.  Pt Diabetic: No Pt smoker: smoker (1/3 ppd x 50 yrs)  Pt meds include: Statin : No ASA: Yes Other anticoagulants/antiplatelets: takes coumadin since his CVA   Past Medical History  Diagnosis Date  . Hyperlipidemia   . Hypertension   . Low back pain   . Tobacco abuse   . 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 Carotid artery stenosis     right  . Asthenia   . Erectile dysfunction   . Hypokalemia   . PUD (peptic ulcer disease)     hx of  . Impetigo     hx of  . Hypokalemia   . Abnormal CT of the abdomen     abnormal lymph nodes  . Abdominal pain     RLQ    Social History History  Substance Use Topics  . Smoking status: Light Tobacco Smoker -- 0.50 packs/day    Types: Cigarettes  . Smokeless tobacco: Former NeurosurgeonUser    Quit date: 08/12/1972  . Alcohol Use: No    Family History Family History  Problem Relation Age of Onset  . Hypertension Mother   . Hypertension Sister   . Diabetes Sister   . Stroke Father     Surgical History History reviewed. No pertinent past surgical  history.  No Known Allergies  Current Outpatient Prescriptions  Medication Sig Dispense Refill  . aspirin 81 MG EC tablet Take 81 mg by mouth daily.      Marland Kitchen. atorvastatin (LIPITOR) 40 MG tablet TAKE 1 TABLET BY MOUTH EVERY EVENING 90 tablet 2  . docusate sodium (COLACE) 100 MG capsule Take 100 mg by mouth daily.      Marland Kitchen. ibuprofen (ADVIL,MOTRIN) 200 MG tablet Take 200 mg by mouth continuous as needed.    Marland Kitchen. omeprazole (PRILOSEC) 20 MG capsule TAKE ONE CAPSULE AT BEDTIME 90 capsule 2  . Thiamine HCl (VITAMIN B-1) 250 MG tablet Take 250 mg by mouth daily.    . Vitamin D, Ergocalciferol, (DRISDOL) 50000 UNITS CAPS capsule Take 1 capsule (50,000 Units total) by mouth every 7 (seven) days. 8 capsule 0  . warfarin (COUMADIN) 3 MG tablet Take 1 tablet by mouth daily except on Mondays and Thursdays--then, take ONLY 1/2 tablet on those days. 30 tablet 2   No current facility-administered medications for this visit.    Review of Systems : See HPI for pertinent positives and negatives.  Physical Examination  Filed Vitals:   12/21/14 1058 12/21/14 1102 12/21/14 1107 12/21/14 1109  BP: 148/86 147/96 121/78 142/82  Pulse: 73 72 67   Resp:  16    Height:  6\' 1"  (1.854 m)    Weight:  138 lb (62.596 kg)    SpO2:  99%     Body mass index is 18.21 kg/(m^2).  General: WDWN male in NAD GAIT: using cane Eyes: PERRLA Pulmonary: Non-labored, CTAB, Negative Rales, Negative rhonchi, & Negative wheezing.  Cardiac: regular Rhythm, no detected murmur.  VASCULAR EXAM Carotid Bruits Left Right   Negative Negative   Aorta is palpable. Radial pulses are 2+ palpable and equal.      LE Pulses LEFT RIGHT   POPLITEAL not palpable  not palpable   POSTERIOR TIBIAL  palpable   palpable    DORSALIS PEDIS  ANTERIOR TIBIAL not palpable  not  palpable     Gastrointestinal: soft, nontender, BS WNL, no r/g, negative masses.  Musculoskeletal: Negative muscle atrophy/wasting. M/S 5/5 in right UE and LE, 4/5 in left leg and arm, Extremities without ischemic changes.  Neurologic: A&O X 3; Appropriate Affect, Speech is slow CN 2-12 intact, Pain and light touch intact in extremities, Motor exam as listed above.         Non-Invasive Vascular Imaging CAROTID DUPLEX 12/21/2014   Name: Robert CoasterMack TemoneyMRN: [id number] Date: 5/11/2016DOB: March 15, 1946 Technologist: Mable Parisody Hosmer, RT, RDMS, RVT Interpreting: Waverly Ferrarihristopher Dickson, MD Referring: Waverly Ferrarihristopher Dickson, MD  INDICATION: Carotid stenosis  PREVIOUS INTERVENTION(S)  IMAGES: Were reviewed by provider  DUPLEX EXAM:  RIGHTLEFT Peak Systolic Velocities (cm/s) End Diastolic Velocities (cm/s) Plaque LOCATION Peak Systolic Velocities (cm/s) End Diastolic Velocities (cm/s) Plaque  52 6  CCA PROXIMAL 99 32   42 7  CCA MID 64 24   30 7   CCA DISTAL 74 26   64 14 HT ECA 94 20   355 127 CP ICA PROXIMAL 73 28 CP  39 15  ICA MID 109 51   30 16  ICA DISTAL 102 44    11.8 ICA/CCA Ratio (PSV) 0.99  Antegrade Vertebral Flow Antegrade  140 Brachial Systolic Pressure (mmHg) 132  Biphasic Brachial Artery Waveforms Biphasic    ADDITIONAL FINDINGS:  No evidence of greater than 50% stenoses noted in the bilateral external or common carotid arteries. Dampened, atypical flow noted in the right mid to distal internal carotid artery consistent with a significant proximal stenosis. The bilateral vertebral arteries demonstrate antegrade  flow.  IMPRESSION:  Doppler velocities suggest a greater than 80% stenosis of the right proximal internal carotid artery and a 1-49% stenosis of the left proximal internal carotid artery.   Compared to the previous exam: No significant change noted when compared to the previous exam on 10/20/13.      Assessment: Robert Newton is a 69 y.o. male who has not had a stroke or TIA since 2004. Today's carotid Duplex suggests greater than 80% stenosis of the right proximal internal carotid artery and a 1-49% stenosis of the left proximal internal carotid artery.   No significant change noted when compared to the previous exam on 10/20/13.   The patient does have greater than 80% ICA stenosis on the right but this is inoperable per Dr. Adele Danickson's progress note due to intracranial disease, the patient does have left-sided weakness from a previous stroke in 2004, no further stroke activity, per pt. 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 He continues to smoke, I encouraged him to consider qutiting and gave him several free resources to help.  Plan: Follow-up in 1 year with Carotid Duplex.   I discussed in depth with the patient the nature of atherosclerosis, and emphasized the importance  of maximal medical management including strict control of blood pressure, blood glucose, and lipid levels, obtaining regular exercise, and cessation of smoking.  The patient is aware that without maximal medical management the underlying atherosclerotic disease process will progress, limiting the benefit of any interventions. The patient was given information about stroke prevention and what symptoms should prompt the patient to seek immediate medical care. Thank you for allowing [place] to participate in this patient's care.  Charisse March, RN, MSN, FNP-C [facility] Office: [phone]  Clinic Physician: Edilia Bo  12/21/2014 11:24 AM

## 2014-12-22 NOTE — Addendum Note (Signed)
Addended by: Adria DillELDRIDGE-LEWIS, Makyla Bye L on: 12/22/2014 03:09 PM   Modules accepted: Orders

## 2014-12-28 ENCOUNTER — Encounter: Payer: Self-pay | Admitting: *Deleted

## 2015-01-02 ENCOUNTER — Ambulatory Visit (INDEPENDENT_AMBULATORY_CARE_PROVIDER_SITE_OTHER): Payer: Medicare Other | Admitting: Pharmacist

## 2015-01-02 DIAGNOSIS — Z8673 Personal history of transient ischemic attack (TIA), and cerebral infarction without residual deficits: Secondary | ICD-10-CM

## 2015-01-02 DIAGNOSIS — Z7901 Long term (current) use of anticoagulants: Secondary | ICD-10-CM | POA: Diagnosis not present

## 2015-01-02 LAB — POCT INR: INR: 2.8

## 2015-01-02 NOTE — Patient Instructions (Signed)
Patient educated about medication as defined in this encounter and verbalized understanding by repeating back instructions provided.   

## 2015-01-02 NOTE — Progress Notes (Signed)
CLINICAL PHARMACIST ANTICOAG NOTE Robert Newton is a 69 y.o. male who reports to the clinic for monitoring of warfarin treatment.    Indication: CVA Duration: indefinite  Anticoagulation Clinic Visit History: Anticoagulation Episode Summary    Current INR goal 2.0-3.0  Next INR check 01/30/2015  INR from last check 2.8 (01/02/2015)  Weekly max dose   Target end date Indefinite  INR check location   Preferred lab   Send INR reminders to    Indications  CEREBROVASCULAR ACCIDENT ACUTE (Resolved) [I67.89] Long term current use of anticoagulant [Z79.01]        Comments        ASSESSMENT Recent Results: Recent results are below, the most recent result is correlated with a dose of 18 mg per week: Lab Results  Component Value Date   INR 2.8 01/02/2015   INR 2.20 12/12/2014   INR 2.60 11/28/2014    INR today: Therapeutic  Anticoagulation Dosing: INR as of 01/02/2015 and Previous Dosing Information    INR Dt INR Goal [facility] Tot Sun Mon Tue Wed Thu Fri Sat   01/02/2015 2.8 2.0-3.0 18 mg 3 mg 1.5 mg 3 mg 3 mg 3 mg 1.5 mg 3 mg    Anticoagulation Dose Instructions as of 01/02/2015      Total Sun Mon Tue Wed Thu Fri Sat   New Dose 18 mg 3 mg 1.5 mg 3 mg 3 mg 3 mg 1.5 mg 3 mg     (3 mg x 1)  (3 mg x 0.5)  (3 mg x 1)  (3 mg x 1)  (3 mg x 1)  (3 mg x 0.5)  (3 mg x 1)                           PLAN Weekly dose was unchanged.  Patient Instructions  Patient educated about medication as defined in this encounter and verbalized understanding by repeating back instructions provided.    Follow-up Return in about 4 weeks (around 01/30/2015) for follow-up INR 01/30/15 @ 8:30 AM.  Celesta AverAmanda D'Ostroph PharmD Candidate  Marzetta BoardJennifer Taylinn Brabant Clinical Pharmacist  15 minutes spent face-to-face with the patient during the encounter. 50% of time spent on education. 50% of time was spent on testing and assessment.

## 2015-01-03 NOTE — Addendum Note (Signed)
Addended by: Neomia DearPOWERS, Lela Murfin E on: 01/03/2015 06:07 PM   Modules accepted: Orders

## 2015-01-23 ENCOUNTER — Ambulatory Visit (INDEPENDENT_AMBULATORY_CARE_PROVIDER_SITE_OTHER): Payer: Medicare Other | Admitting: Pharmacist

## 2015-01-23 DIAGNOSIS — Z8673 Personal history of transient ischemic attack (TIA), and cerebral infarction without residual deficits: Secondary | ICD-10-CM | POA: Diagnosis not present

## 2015-01-23 DIAGNOSIS — Z7901 Long term (current) use of anticoagulants: Secondary | ICD-10-CM

## 2015-01-23 LAB — POCT INR: INR: 2.6

## 2015-01-23 NOTE — Progress Notes (Signed)
Anti-Coagulation Progress Note  Robert Newton is a 69 y.o. male who is currently on an anti-coagulation regimen.    RECENT RESULTS: Recent results are below, the most recent result is correlated with a dose of 18 mg. per week: Lab Results  Component Value Date   INR 2.60 01/23/2015   INR 2.8 01/02/2015   INR 2.20 12/12/2014    ANTI-COAG DOSE: Anticoagulation Dose Instructions as of 01/23/2015      [name] Tue Wed Thu Fri Sat   New Dose 3 mg 1.5 mg 3 mg 3 mg 3 mg 1.5 mg 3 mg       ANTICOAG SUMMARY: Anticoagulation Episode Summary    Current INR goal 2.0-3.0  Next INR check 02/20/2015  INR from last check 2.60 (01/23/2015)  Weekly max dose   Target end date Indefinite  INR check location   Preferred lab   Send INR reminders to    Indications  CEREBROVASCULAR ACCIDENT ACUTE (Resolved) [I67.89] Long term current use of anticoagulant [Z79.01]        Comments         ANTICOAG TODAY: Anticoagulation Summary as of 01/23/2015    INR goal 2.0-3.0  Selected INR 2.60 (01/23/2015)  Next INR check 02/20/2015  Target end date Indefinite   Indications  CEREBROVASCULAR ACCIDENT 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  Patient instructed to take today's dose.  Patient verbalized understanding of these instructions.       FOLLOW-UP Return in 4 weeks (on 02/20/2015) for Follow up INR at 0830h.  Hulen Luster, III Pharm.D., CACP

## 2015-01-23 NOTE — Patient Instructions (Signed)
Patient instructed to take medications as defined in the Anti-coagulation Track section of this encounter.  Patient instructed to take today's dose.  Patient verbalized understanding of these instructions.    

## 2015-01-30 ENCOUNTER — Ambulatory Visit: Payer: Medicare Other

## 2015-01-31 DIAGNOSIS — R310 Gross hematuria: Secondary | ICD-10-CM | POA: Diagnosis not present

## 2015-02-09 DIAGNOSIS — N281 Cyst of kidney, acquired: Secondary | ICD-10-CM | POA: Diagnosis not present

## 2015-02-09 DIAGNOSIS — R310 Gross hematuria: Secondary | ICD-10-CM | POA: Diagnosis not present

## 2015-02-09 DIAGNOSIS — K8020 Calculus of gallbladder without cholecystitis without obstruction: Secondary | ICD-10-CM | POA: Diagnosis not present

## 2015-02-09 DIAGNOSIS — N200 Calculus of kidney: Secondary | ICD-10-CM | POA: Diagnosis not present

## 2015-02-14 ENCOUNTER — Ambulatory Visit (INDEPENDENT_AMBULATORY_CARE_PROVIDER_SITE_OTHER): Payer: Medicare Other | Admitting: Pharmacist

## 2015-02-14 ENCOUNTER — Encounter: Payer: Self-pay | Admitting: Internal Medicine

## 2015-02-14 DIAGNOSIS — Z8673 Personal history of transient ischemic attack (TIA), and cerebral infarction without residual deficits: Secondary | ICD-10-CM

## 2015-02-14 DIAGNOSIS — Z7901 Long term (current) use of anticoagulants: Secondary | ICD-10-CM

## 2015-02-14 DIAGNOSIS — R310 Gross hematuria: Secondary | ICD-10-CM | POA: Diagnosis not present

## 2015-02-14 LAB — POCT INR: INR: 3

## 2015-02-14 NOTE — Progress Notes (Signed)
I have reviewed Dr. Saralyn PilarGroce's note.  His INR is high, coumadin dose decreased.

## 2015-02-14 NOTE — Patient Instructions (Signed)
Patient instructed to take medications as defined in the Anti-coagulation Track section of this encounter.  Patient instructed to take today's dose.  Patient verbalized understanding of these instructions.    

## 2015-02-14 NOTE — Progress Notes (Signed)
Anti-Coagulation Progress Note  Merilynn FinlandMack Divito is a 69 y.o. male who is currently on an anti-coagulation regimen.    RECENT RESULTS: Recent results are below, the most recent result is correlated with a dose of 18 mg. per week:  Additionally, the patient brings with him ([facility] results of his CT abdomen and pelvis without and with contrast [facility], [address] [place] [id number] Ph: [phone] performed 30-June-16 by Dr. Berniece SalinesBenjamin Herrick:  Impression:  1. Bilateral nonobstructive nephrolithiasis. 2. Benign right kidney lower pole renal cyst. Small hypodensity in the left kidney upper pole was also likely a cyst, although technically too small to characterize. 3. Cholelithiasis. 4. Aortoiliac atherosclerotic vascular disease.  Lab Results  Component Value Date   INR 3.0 02/14/2015   INR 2.60 01/23/2015   INR 2.8 01/02/2015    ANTI-COAG DOSE: Anticoagulation Dose Instructions as of 02/14/2015      Glynis SmilesSun Mon Tue Wed Thu Fri Sat   New Dose 3 mg 1.5 mg 3 mg 1.5 mg 3 mg 1.5 mg 3 mg       ANTICOAG SUMMARY: Anticoagulation Episode Summary    Current INR goal 2.0-3.0  Next INR check 03/13/2015  INR from last check 3.0 (02/14/2015)  Weekly max dose   Target end date Indefinite  INR check location   Preferred lab   Send INR reminders to    Indications  CEREBROVASCULAR ACCIDENT ACUTE (Resolved) [I67.89] Long term current use of anticoagulant [Z79.01]        Comments         ANTICOAG TODAY: Anticoagulation Summary as of 02/14/2015    INR goal 2.0-3.0  Selected INR 3.0 (02/14/2015)  Next INR check 03/13/2015  Target end date Indefinite   Indications  CEREBROVASCULAR ACCIDENT ACUTE (Resolved) [I67.89] Long term current use of anticoagulant [Z79.01]      Anticoagulation Episode Summary    INR check location    Preferred lab    Send INR reminders to    Comments       PATIENT  INSTRUCTIONS: Patient Instructions  Patient instructed to take medications as defined in the Anti-coagulation Track section of this encounter.  Patient instructed to take today's dose.  Patient verbalized understanding of these instructions.       FOLLOW-UP Return in 4 weeks (on 03/13/2015) for Follow up INR at 0830h.  Hulen LusterJames Groce, III Pharm.D., CACP

## 2015-02-16 ENCOUNTER — Encounter: Payer: Self-pay | Admitting: Internal Medicine

## 2015-02-20 ENCOUNTER — Ambulatory Visit: Payer: Medicare Other

## 2015-03-13 ENCOUNTER — Ambulatory Visit (INDEPENDENT_AMBULATORY_CARE_PROVIDER_SITE_OTHER): Payer: Medicare Other | Admitting: Pharmacist

## 2015-03-13 DIAGNOSIS — Z8673 Personal history of transient ischemic attack (TIA), and cerebral infarction without residual deficits: Secondary | ICD-10-CM | POA: Diagnosis not present

## 2015-03-13 DIAGNOSIS — Z7901 Long term (current) use of anticoagulants: Secondary | ICD-10-CM | POA: Diagnosis not present

## 2015-03-13 LAB — POCT INR: INR: 3

## 2015-03-13 NOTE — Progress Notes (Signed)
INTERNAL MEDICINE TEACHING ATTENDING ADDENDUM - Earl Lagos M.D  Duration- indefinite, Indication- CVA, carotid stenosis, INR- therapeutic. Agree with pharmacy recommendations as outlined in their note.

## 2015-03-13 NOTE — Patient Instructions (Signed)
Patient instructed to take medications as defined in the Anti-coagulation Track section of this encounter.  Patient instructed to take today's dose.  Patient verbalized understanding of these instructions.    

## 2015-03-13 NOTE — Progress Notes (Signed)
Anti-Coagulation Progress Note  Robert Newton is a 69 y.o. male who is currently on an anti-coagulation regimen.    RECENT RESULTS: Recent results are below, the most recent result is correlated with a dose of 16.5 mg. per week: Lab Results  Component Value Date   INR 3.00 03/13/2015   INR 3.0 02/14/2015   INR 2.60 01/23/2015    ANTI-COAG DOSE: Anticoagulation Dose Instructions as of 03/13/2015      [name] Tue Wed Thu Fri Sat   New Dose 1.5 mg 1.5 mg 3 mg 1.5 mg 3 mg 1.5 mg 3 mg       ANTICOAG SUMMARY: Anticoagulation Episode Summary    Current INR goal 2.0-3.0  Next INR check 04/10/2015  INR from last check 3.00 (03/13/2015)  Weekly max dose   Target end date Indefinite  INR check location   Preferred lab   Send INR reminders to    Indications  CEREBROVASCULAR ACCIDENT ACUTE (Resolved) [I67.89] Long term current use of anticoagulant [Z79.01]        Comments         ANTICOAG TODAY: Anticoagulation Summary as of 03/13/2015    INR goal 2.0-3.0  Selected INR 3.00 (03/13/2015)  Next INR check 04/10/2015  Target end date Indefinite   Indications  CEREBROVASCULAR ACCIDENT 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  Patient instructed to take today's dose.  Patient verbalized understanding of these instructions.        FOLLOW-UP Return in 4 weeks (on 04/10/2015) for Follow up INR at 0845h.  Hulen Luster, III Pharm.D., CACP

## 2015-04-10 ENCOUNTER — Ambulatory Visit (INDEPENDENT_AMBULATORY_CARE_PROVIDER_SITE_OTHER): Payer: Medicare Other | Admitting: Pharmacist

## 2015-04-10 DIAGNOSIS — Z7901 Long term (current) use of anticoagulants: Secondary | ICD-10-CM

## 2015-04-10 DIAGNOSIS — Z8673 Personal history of transient ischemic attack (TIA), and cerebral infarction without residual deficits: Secondary | ICD-10-CM

## 2015-04-10 LAB — POCT INR: INR: 2.3

## 2015-04-10 NOTE — Progress Notes (Signed)
Anti-Coagulation Progress Note  Robert Newton is a 69 y.o. male who is currently on an anti-coagulation regimen.    RECENT RESULTS: Recent results are below, the most recent result is correlated with a dose of 15 mg. per week: Lab Results  Component Value Date   INR 2.30 04/10/2015   INR 3.00 03/13/2015   INR 3.0 02/14/2015    ANTI-COAG DOSE: Anticoagulation Dose Instructions as of 04/10/2015      [name] Tue Wed Thu Fri Sat   New Dose 1.5 mg 1.5 mg 3 mg 1.5 mg 3 mg 1.5 mg 3 mg       ANTICOAG SUMMARY: Anticoagulation Episode Summary    Current INR goal 2.0-3.0  Next INR check 05/08/2015  INR from last check 2.30 (04/10/2015)  Weekly max dose   Target end date Indefinite  INR check location   Preferred lab   Send INR reminders to    Indications  CEREBROVASCULAR ACCIDENT ACUTE (Resolved) [I67.89] Long term current use of anticoagulant [Z79.01]        Comments         ANTICOAG TODAY: Anticoagulation Summary as of 04/10/2015    INR goal 2.0-3.0  Selected INR 2.30 (04/10/2015)  Next INR check 05/08/2015  Target end date Indefinite   Indications  CEREBROVASCULAR ACCIDENT 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  Patient instructed to take today's dose.  Patient verbalized understanding of these instructions.       FOLLOW-UP Return in 4 weeks (on 05/08/2015) for Follow up INR at 0830h.  Hulen Luster, III Pharm.D., CACP

## 2015-04-10 NOTE — Patient Instructions (Signed)
Patient instructed to take medications as defined in the Anti-coagulation Track section of this encounter.  Patient instructed to take today's dose.  Patient verbalized understanding of these instructions.    

## 2015-04-12 ENCOUNTER — Other Ambulatory Visit: Payer: Self-pay | Admitting: Internal Medicine

## 2015-05-08 ENCOUNTER — Ambulatory Visit (INDEPENDENT_AMBULATORY_CARE_PROVIDER_SITE_OTHER): Payer: Medicare Other | Admitting: Pharmacist

## 2015-05-08 DIAGNOSIS — Z7901 Long term (current) use of anticoagulants: Secondary | ICD-10-CM

## 2015-05-08 DIAGNOSIS — Z8673 Personal history of transient ischemic attack (TIA), and cerebral infarction without residual deficits: Secondary | ICD-10-CM | POA: Diagnosis not present

## 2015-05-08 DIAGNOSIS — I6529 Occlusion and stenosis of unspecified carotid artery: Secondary | ICD-10-CM | POA: Diagnosis not present

## 2015-05-08 LAB — POCT INR: INR: 2.1

## 2015-05-08 NOTE — Patient Instructions (Signed)
Patient instructed to take medications as defined in the Anti-coagulation Track section of this encounter.  Patient instructed to take today's dose.  Patient verbalized understanding of these instructions.    

## 2015-05-08 NOTE — Progress Notes (Signed)
Anti-Coagulation Progress Note  Robert Newton is a 69 y.o. male who is currently on an anti-coagulation regimen.    RECENT RESULTS: Recent results are below, the most recent result is correlated with a dose of 15 mg. per week: Lab Results  Component Value Date   INR 2.10 05/08/2015   INR 2.30 04/10/2015   INR 3.00 03/13/2015    ANTI-COAG DOSE: Anticoagulation Dose Instructions as of 05/08/2015      [name] Tue Wed Thu Fri Sat   New Dose 3 mg 1.5 mg 3 mg 1.5 mg 3 mg 1.5 mg 3 mg       ANTICOAG SUMMARY: Anticoagulation Episode Summary    Current INR goal 2.0-3.0  Next INR check 06/05/2015  INR from last check 2.10 (05/08/2015)  Weekly max dose   Target end date Indefinite  INR check location   Preferred lab   Send INR reminders to    Indications  CEREBROVASCULAR ACCIDENT ACUTE (Resolved) [I67.89] Long term current use of anticoagulant [Z79.01]        Comments         ANTICOAG TODAY: Anticoagulation Summary as of 05/08/2015    INR goal 2.0-3.0  Selected INR 2.10 (05/08/2015)  Next INR check 06/05/2015  Target end date Indefinite   Indications  CEREBROVASCULAR ACCIDENT 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  Patient instructed to take today's dose.  Patient verbalized understanding of these instructions.       FOLLOW-UP Return in 4 weeks (on 06/05/2015) for For follow up INR at 0830am.  Hulen Luster, III Pharm.D., CACP

## 2015-05-09 NOTE — Progress Notes (Signed)
I have reviewed Dr. Saralyn Pilar note.  He has carotid artery stenosis, is not a candidate for surgery.  INR 2.1 and coumadin increased slightly.

## 2015-06-05 ENCOUNTER — Ambulatory Visit (INDEPENDENT_AMBULATORY_CARE_PROVIDER_SITE_OTHER): Payer: Medicare Other | Admitting: Pharmacist

## 2015-06-05 DIAGNOSIS — Z7901 Long term (current) use of anticoagulants: Secondary | ICD-10-CM | POA: Diagnosis not present

## 2015-06-05 DIAGNOSIS — Z8673 Personal history of transient ischemic attack (TIA), and cerebral infarction without residual deficits: Secondary | ICD-10-CM | POA: Diagnosis not present

## 2015-06-05 NOTE — Progress Notes (Signed)
Anti-Coagulation Progress Note  Merilynn FinlandMack Parlato is a 69 y.o. male who is currently on an anti-coagulation regimen.    RECENT RESULTS: Recent results are below, the most recent result is correlated with a dose of 16.5 mg. per week: Lab Results  Component Value Date   INR 2.10 05/08/2015   INR 2.30 04/10/2015   INR 3.00 03/13/2015    ANTI-COAG DOSE: Anticoagulation Dose Instructions as of 06/05/2015      Glynis SmilesSun Mon Tue Wed Thu Fri Sat   New Dose 3 mg 1.5 mg 3 mg 3 mg 3 mg 1.5 mg 3 mg       ANTICOAG SUMMARY: Anticoagulation Episode Summary    Current INR goal 2.0-3.0  Next INR check 07/03/2015  INR from last check 2.10 (05/08/2015)  Weekly max dose   Target end date Indefinite  INR check location   Preferred lab   Send INR reminders to    Indications  CEREBROVASCULAR ACCIDENT ACUTE (Resolved) [I67.89] Long term current use of anticoagulant [Z79.01]        Comments         ANTICOAG TODAY: Anticoagulation Summary as of 06/05/2015    INR goal 2.0-3.0  Selected INR 2.10 (05/08/2015)  Next INR check 07/03/2015  Target end date Indefinite   Indications  CEREBROVASCULAR ACCIDENT 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  Patient instructed to take today's dose.  Patient verbalized understanding of these instructions.       FOLLOW-UP Return in 4 weeks (on 07/03/2015) for Follow up INR at 0845h.  Hulen LusterJames Jonnie Kubly, III Pharm.D., CACP

## 2015-06-05 NOTE — Patient Instructions (Signed)
Patient instructed to take medications as defined in the Anti-coagulation Track section of this encounter.  Patient instructed to take today's dose.  Patient verbalized understanding of these instructions.    

## 2015-06-06 NOTE — Progress Notes (Signed)
Indication: Carotid stenosis, Neurosurgery's recommendation. Duration: Indefinite. INR: At target. Agree with Dr. Saralyn PilarGroce's assessment and plan.

## 2015-07-11 ENCOUNTER — Other Ambulatory Visit: Payer: Self-pay | Admitting: Internal Medicine

## 2015-07-11 NOTE — Telephone Encounter (Signed)
Sig edited to reflect most recent anti-coagulation office visit note.

## 2015-07-13 ENCOUNTER — Other Ambulatory Visit: Payer: Self-pay

## 2015-07-14 MED ORDER — ATORVASTATIN CALCIUM 40 MG PO TABS: 40.0000 mg | ORAL_TABLET | Freq: Every evening | ORAL | Status: DC

## 2015-07-14 MED ORDER — OMEPRAZOLE 20 MG PO CPDR: 20.0000 mg | DELAYED_RELEASE_CAPSULE | Freq: Every day | ORAL | Status: DC

## 2015-07-24 ENCOUNTER — Ambulatory Visit (INDEPENDENT_AMBULATORY_CARE_PROVIDER_SITE_OTHER): Payer: Medicare Other | Admitting: Pharmacist

## 2015-07-24 DIAGNOSIS — Z7901 Long term (current) use of anticoagulants: Secondary | ICD-10-CM

## 2015-07-24 LAB — POCT INR: INR: 2.2

## 2015-07-24 NOTE — Patient Instructions (Signed)
Patient instructed to take medications as defined in the Anti-coagulation Track section of this encounter.  Patient instructed to take today's dose.  Patient verbalized understanding of these instructions.    

## 2015-07-24 NOTE — Progress Notes (Signed)
Anti-Coagulation Progress Note  Merilynn FinlandMack Juarez is a 69 y.o. male who is currently on an anti-coagulation regimen.    RECENT RESULTS: Recent results are below, the most recent result is correlated with a dose of 18 mg. per week: Lab Results  Component Value Date   INR 2.20 07/24/2015   INR 2.10 05/08/2015   INR 2.30 04/10/2015    ANTI-COAG DOSE: Anticoagulation Dose Instructions as of 07/24/2015      Glynis SmilesSun Mon Tue Wed Thu Fri Sat   New Dose 3 mg 3 mg 3 mg 3 mg 3 mg 1.5 mg 3 mg       ANTICOAG SUMMARY: Anticoagulation Episode Summary    Current INR goal 2.0-3.0  Next INR check 08/21/2015  INR from last check 2.20 (07/24/2015)  Weekly max dose   Target end date Indefinite  INR check location   Preferred lab   Send INR reminders to    Indications  CEREBROVASCULAR ACCIDENT ACUTE (Resolved) [I67.89] Long term current use of anticoagulant [Z79.01]        Comments         ANTICOAG TODAY: Anticoagulation Summary as of 07/24/2015    INR goal 2.0-3.0  Selected INR 2.20 (07/24/2015)  Next INR check 08/21/2015  Target end date Indefinite   Indications  CEREBROVASCULAR ACCIDENT 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  Patient instructed to take today's dose.  Patient verbalized understanding of these instructions.       FOLLOW-UP Return in 4 weeks (on 08/21/2015) for Follow up INR at 0900h.  Hulen LusterJames Groce, III Pharm.D., CACP

## 2015-07-31 NOTE — Progress Notes (Signed)
I have reviewed Dr. Saralyn PilarGroce's note.  INR low range of goal, coumadin increased.

## 2015-08-15 ENCOUNTER — Ambulatory Visit (INDEPENDENT_AMBULATORY_CARE_PROVIDER_SITE_OTHER): Payer: Medicare Other | Admitting: Pharmacist

## 2015-08-15 DIAGNOSIS — Z8673 Personal history of transient ischemic attack (TIA), and cerebral infarction without residual deficits: Secondary | ICD-10-CM | POA: Diagnosis not present

## 2015-08-15 DIAGNOSIS — Z7901 Long term (current) use of anticoagulants: Secondary | ICD-10-CM

## 2015-08-15 LAB — POCT INR: INR: 2.8

## 2015-08-15 NOTE — Patient Instructions (Signed)
Patient instructed to take medications as defined in the Anti-coagulation Track section of this encounter.  Patient instructed to take today's dose.  Patient verbalized understanding of these instructions.    

## 2015-08-15 NOTE — Progress Notes (Signed)
Anti-Coagulation Progress Note  Merilynn FinlandMack Caridi is a 70 y.o. male who is currently on an anti-coagulation regimen.    RECENT RESULTS: Recent results are below, the most recent result is correlated with a dose of 19.5 mg. per week: Lab Results  Component Value Date   INR 2.80 08/15/2015   INR 2.20 07/24/2015   INR 2.10 05/08/2015    ANTI-COAG DOSE: Anticoagulation Dose Instructions as of 08/15/2015      Glynis SmilesSun Mon Tue Wed Thu Fri Sat   New Dose 3 mg 3 mg 3 mg 3 mg 3 mg 1.5 mg 3 mg       ANTICOAG SUMMARY: Anticoagulation Episode Summary    Current INR goal 2.0-3.0  Next INR check 09/11/2015  INR from last check 2.80 (08/15/2015)  Weekly max dose   Target end date Indefinite  INR check location   Preferred lab   Send INR reminders to    Indications  CEREBROVASCULAR ACCIDENT ACUTE (Resolved) [I67.89] Long term current use of anticoagulant [Z79.01]        Comments         ANTICOAG TODAY: Anticoagulation Summary as of 08/15/2015    INR goal 2.0-3.0  Selected INR 2.80 (08/15/2015)  Next INR check 09/11/2015  Target end date Indefinite   Indications  CEREBROVASCULAR ACCIDENT 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  Patient instructed to take today's dose.  Patient verbalized understanding of these instructions.       FOLLOW-UP Return in 4 weeks (on 09/11/2015) for Follow up INR at 0845h.  Hulen LusterJames Jayelle Page, III Pharm.D., CACP

## 2015-08-21 ENCOUNTER — Ambulatory Visit: Payer: Self-pay

## 2015-09-11 ENCOUNTER — Ambulatory Visit (INDEPENDENT_AMBULATORY_CARE_PROVIDER_SITE_OTHER): Payer: Medicare Other | Admitting: Pharmacist

## 2015-09-11 DIAGNOSIS — Z8673 Personal history of transient ischemic attack (TIA), and cerebral infarction without residual deficits: Secondary | ICD-10-CM | POA: Diagnosis not present

## 2015-09-11 DIAGNOSIS — Z7901 Long term (current) use of anticoagulants: Secondary | ICD-10-CM

## 2015-09-11 LAB — POCT INR: INR: 2.9

## 2015-09-11 NOTE — Progress Notes (Signed)
Indication: Carotid stenosis, Neurosurgery's recommendation. Duration: Indefinite. INR: At target. Agree with Dr. Groce's assessment and plan. 

## 2015-09-11 NOTE — Patient Instructions (Signed)
Patient instructed to take medications as defined in the Anti-coagulation Track section of this encounter.  Patient instructed to take today's dose.  Patient verbalized understanding of these instructions.    

## 2015-09-11 NOTE — Progress Notes (Signed)
Anti-Coagulation Progress Note  Robert Newton is a 70 y.o. male who is currently on an anti-coagulation regimen.    RECENT RESULTS: Recent results are below, the most recent result is correlated with a dose of 19.5 mg. per week: Lab Results  Component Value Date   INR 2.90 09/11/2015   INR 2.80 08/15/2015   INR 2.20 07/24/2015    ANTI-COAG DOSE: Anticoagulation Dose Instructions as of 09/11/2015      [name] Tue Wed Thu Fri Sat   New Dose 3 mg 1.5 mg 3 mg 3 mg 1.5 mg 3 mg 3 mg       ANTICOAG SUMMARY: Anticoagulation Episode Summary    Current INR goal 2.0-3.0  Next INR check 10/09/2015  INR from last check 2.90 (09/11/2015)  Weekly max dose   Target end date Indefinite  INR check location   Preferred lab   Send INR reminders to    Indications  CEREBROVASCULAR ACCIDENT ACUTE (Resolved) [I67.89] Long term current use of anticoagulant [Z79.01]        Comments         ANTICOAG TODAY: Anticoagulation Summary as of 09/11/2015    INR goal 2.0-3.0  Selected INR 2.90 (09/11/2015)  Next INR check 10/09/2015  Target end date Indefinite   Indications  CEREBROVASCULAR ACCIDENT 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  Patient instructed to take today's dose.  Patient verbalized understanding of these instructions.       FOLLOW-UP Return in 4 weeks (on 10/09/2015) for Follow up INR at 0845h.  Hulen Luster, III Pharm.D., CACP

## 2015-09-21 ENCOUNTER — Encounter: Payer: Self-pay | Admitting: Internal Medicine

## 2015-10-09 ENCOUNTER — Ambulatory Visit (INDEPENDENT_AMBULATORY_CARE_PROVIDER_SITE_OTHER): Payer: Medicare Other | Admitting: Pharmacist

## 2015-10-09 DIAGNOSIS — Z8673 Personal history of transient ischemic attack (TIA), and cerebral infarction without residual deficits: Secondary | ICD-10-CM

## 2015-10-09 DIAGNOSIS — Z7901 Long term (current) use of anticoagulants: Secondary | ICD-10-CM

## 2015-10-09 LAB — POCT INR: INR: 2.5

## 2015-10-09 NOTE — Progress Notes (Signed)
Anti-Coagulation Progress Note  Robert Newton is a 70 y.o. male who is currently on an anti-coagulation regimen.    RECENT RESULTS: Recent results are below, the most recent result is correlated with a dose of 18 mg. per week: Lab Results  Component Value Date   INR 2.50 10/09/2015   INR 2.90 09/11/2015   INR 2.80 08/15/2015    ANTI-COAG DOSE: Anticoagulation Dose Instructions as of 10/09/2015      [name] Tue Wed Thu Fri Sat   New Dose 3 mg 1.5 mg 3 mg 3 mg 1.5 mg 3 mg 3 mg       ANTICOAG SUMMARY: Anticoagulation Episode Summary    Current INR goal 2.0-3.0  Next INR check 11/06/2015  INR from last check 2.50 (10/09/2015)  Weekly max dose   Target end date Indefinite  INR check location   Preferred lab   Send INR reminders to    Indications  CEREBROVASCULAR ACCIDENT ACUTE (Resolved) [I67.89] Long term current use of anticoagulant [Z79.01]        Comments         ANTICOAG TODAY: Anticoagulation Summary as of 10/09/2015    INR goal 2.0-3.0  Selected INR 2.50 (10/09/2015)  Next INR check 11/06/2015  Target end date Indefinite   Indications  CEREBROVASCULAR ACCIDENT 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  Patient instructed to take today's dose.  Patient verbalized understanding of these instructions.       FOLLOW-UP Return in 4 weeks (on 11/06/2015) for Follow up INR at 0845h.  Hulen Luster, III Pharm.D., CACP

## 2015-10-09 NOTE — Patient Instructions (Signed)
Patient instructed to take medications as defined in the Anti-coagulation Track section of this encounter.  Patient instructed to take today's dose.  Patient verbalized understanding of these instructions.    

## 2015-11-06 ENCOUNTER — Ambulatory Visit (INDEPENDENT_AMBULATORY_CARE_PROVIDER_SITE_OTHER): Payer: Medicare Other | Admitting: Pharmacist

## 2015-11-06 DIAGNOSIS — Z8673 Personal history of transient ischemic attack (TIA), and cerebral infarction without residual deficits: Secondary | ICD-10-CM | POA: Diagnosis not present

## 2015-11-06 DIAGNOSIS — Z7901 Long term (current) use of anticoagulants: Secondary | ICD-10-CM

## 2015-11-06 LAB — POCT INR: INR: 2.9

## 2015-11-06 NOTE — Patient Instructions (Signed)
Patient instructed to take medications as defined in the Anti-coagulation Track section of this encounter.  Patient instructed to take today's dose.  Patient verbalized understanding of these instructions.    

## 2015-11-06 NOTE — Progress Notes (Signed)
INTERNAL MEDICINE TEACHING ATTENDING ADDENDUM - Talita Recht M.D  Duration- indefinite, Indication- carotid artery stenosis, CVA, INR- therapeutic. Agree with pharmacy recommendations as outlined in their note.

## 2015-11-06 NOTE — Progress Notes (Signed)
Anti-Coagulation Progress Note  Robert Newton is a 70 y.o. male who is currently on an anti-coagulation regimen.    RECENT RESULTS: Recent results are below, the most recent result is correlated with a dose of 18 mg. per week: Lab Results  Component Value Date   INR 2.90 11/06/2015   INR 2.50 10/09/2015   INR 2.90 09/11/2015    ANTI-COAG DOSE: Anticoagulation Dose Instructions as of 11/06/2015      Glynis SmilesSun Mon Tue Wed Thu Fri Sat   New Dose 3 mg 1.5 mg 1.5 mg 3 mg 1.5 mg 3 mg 1.5 mg       ANTICOAG SUMMARY: Anticoagulation Episode Summary    Current INR goal 2.0-3.0  Next INR check 12/04/2015  INR from last check 2.90 (11/06/2015)  Weekly max dose   Target end date Indefinite  INR check location   Preferred lab   Send INR reminders to    Indications  CEREBROVASCULAR ACCIDENT ACUTE (Resolved) [I67.89] Long term current use of anticoagulant [Z79.01]        Comments         ANTICOAG TODAY: Anticoagulation Summary as of 11/06/2015    INR goal 2.0-3.0  Selected INR 2.90 (11/06/2015)  Next INR check 12/04/2015  Target end date Indefinite   Indications  CEREBROVASCULAR ACCIDENT 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  Patient instructed to take today's dose.  Patient verbalized understanding of these instructions.       FOLLOW-UP Return in 4 weeks (on 12/04/2015) for Follow up INR at 0845h.  Hulen LusterJames Raesean Bartoletti, III Pharm.D., CACP

## 2015-11-15 ENCOUNTER — Other Ambulatory Visit: Payer: Self-pay | Admitting: Internal Medicine

## 2015-12-04 ENCOUNTER — Ambulatory Visit (INDEPENDENT_AMBULATORY_CARE_PROVIDER_SITE_OTHER): Payer: Medicare Other | Admitting: Pharmacist

## 2015-12-04 DIAGNOSIS — Z7901 Long term (current) use of anticoagulants: Secondary | ICD-10-CM

## 2015-12-04 DIAGNOSIS — Z8673 Personal history of transient ischemic attack (TIA), and cerebral infarction without residual deficits: Secondary | ICD-10-CM

## 2015-12-04 LAB — POCT INR: INR: 2.7

## 2015-12-04 NOTE — Progress Notes (Signed)
Anti-Coagulation Progress Note  Merilynn FinlandMack Elmore is a 70 y.o. male who is currently on an anti-coagulation regimen.    RECENT RESULTS: Recent results are below, the most recent result is correlated with a dose of 15 mg. 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.89] Long term current use of anticoagulant [Z79.01]        Comments         ANTICOAG TODAY: Anticoagulation Summary as of 12/04/2015    INR goal 2.0-3.0  Selected INR 2.70 (12/04/2015)  Next INR check 01/01/2016  Target end date Indefinite   Indications  CEREBROVASCULAR ACCIDENT 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  Patient instructed to take today's dose.  Patient verbalized understanding of these instructions.       FOLLOW-UP Return in 4 weeks (on 01/01/2016) for Follow up INR at 0845h.  Hulen LusterJames Ketan Renz, III Pharm.D., CACP

## 2015-12-04 NOTE — Progress Notes (Signed)
Indication: Carotid stenosis, Neurosurgery's recommendation. Duration: Indefinite. INR: At target. Agree with Dr. Groce's assessment and plan. 

## 2015-12-04 NOTE — Patient Instructions (Signed)
Patient instructed to take medications as defined in the Anti-coagulation Track section of this encounter.  Patient instructed to take today's dose.  Patient verbalized understanding of these instructions.    

## 2015-12-22 ENCOUNTER — Encounter: Payer: Self-pay | Admitting: Family

## 2015-12-27 ENCOUNTER — Ambulatory Visit (HOSPITAL_COMMUNITY): Payer: Medicare Other | Attending: Family

## 2015-12-27 ENCOUNTER — Ambulatory Visit: Payer: Medicare Other | Admitting: Family

## 2016-01-01 ENCOUNTER — Ambulatory Visit (INDEPENDENT_AMBULATORY_CARE_PROVIDER_SITE_OTHER): Payer: Medicare Other | Admitting: Pharmacist

## 2016-01-01 DIAGNOSIS — Z8673 Personal history of transient ischemic attack (TIA), and cerebral infarction without residual deficits: Secondary | ICD-10-CM

## 2016-01-01 DIAGNOSIS — Z7901 Long term (current) use of anticoagulants: Secondary | ICD-10-CM

## 2016-01-01 LAB — POCT INR: INR: 2.2

## 2016-01-01 NOTE — Progress Notes (Signed)
INTERNAL MEDICINE TEACHING ATTENDING ADDENDUM - Kanen Mottola M.D  Duration- indefinite, Indication- carotid artery stenosis, CVA, INR- therapeutic. Agree with pharmacy recommendations as outlined in their note.

## 2016-01-01 NOTE — Patient Instructions (Signed)
Patient instructed to take medications as defined in the Anti-coagulation Track section of this encounter.  Patient instructed to take today's dose.  Patient verbalized understanding of these instructions.    

## 2016-01-01 NOTE — Progress Notes (Signed)
Anti-Coagulation Progress Note  Robert Newton is a 70 y.o. male who is currently on an anti-coagulation regimen.    RECENT RESULTS: Recent results are below, the most recent result is correlated with a dose of 15 mg. per week: Lab Results  Component Value Date   INR 2.20 01/01/2016   INR 2.70 12/04/2015   INR 2.90 11/06/2015    ANTI-COAG DOSE: Anticoagulation Dose Instructions as of 01/01/2016      Glynis SmilesSun Mon Tue Wed Thu Fri Sat   New Dose 3 mg 1.5 mg 1.5 mg 3 mg 1.5 mg 3 mg 1.5 mg       ANTICOAG SUMMARY: Anticoagulation Episode Summary    Current INR goal 2.0-3.0  Next INR check 01/29/2016  INR from last check 2.20 (01/01/2016)  Weekly max dose   Target end date Indefinite  INR check location   Preferred lab   Send INR reminders to    Indications  CEREBROVASCULAR ACCIDENT ACUTE (Resolved) [I67.89] Long term current use of anticoagulant [Z79.01]        Comments         ANTICOAG TODAY: Anticoagulation Summary as of 01/01/2016    INR goal 2.0-3.0  Selected INR 2.20 (01/01/2016)  Next INR check 01/29/2016  Target end date Indefinite   Indications  CEREBROVASCULAR ACCIDENT 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  Patient instructed to take today's dose.  Patient verbalized understanding of these instructions.       FOLLOW-UP Return in 4 weeks (on 01/29/2016) for Follow up INR at 0830h.  Hulen LusterJames Hulon Ferron, III Pharm.D., CACP

## 2016-01-29 ENCOUNTER — Ambulatory Visit (INDEPENDENT_AMBULATORY_CARE_PROVIDER_SITE_OTHER): Payer: Medicare Other | Admitting: Pharmacist

## 2016-01-29 DIAGNOSIS — Z8673 Personal history of transient ischemic attack (TIA), and cerebral infarction without residual deficits: Secondary | ICD-10-CM

## 2016-01-29 DIAGNOSIS — Z7901 Long term (current) use of anticoagulants: Secondary | ICD-10-CM

## 2016-01-29 LAB — POCT INR: INR: 2.1

## 2016-01-29 NOTE — Patient Instructions (Signed)
Patient instructed to take medications as defined in the Anti-coagulation Track section of this encounter.  Patient instructed to take today's dose.  Patient verbalized understanding of these instructions.    

## 2016-01-29 NOTE — Progress Notes (Signed)
Anti-Coagulation Progress Note  Robert Newton is a 70 y.o. male who is currently on an anti-coagulation regimen.    RECENT RESULTS: Recent results are below, the most recent result is correlated with a dose of 15 mg. per week: Lab Results  Component Value Date   INR 2.10 01/29/2016   INR 2.20 01/01/2016   INR 2.70 12/04/2015    ANTI-COAG DOSE: Anticoagulation Dose Instructions as of 01/29/2016      Glynis SmilesSun Mon Tue Wed Thu Fri Sat   New Dose 3 mg 1.5 mg 3 mg 3 mg 1.5 mg 3 mg 3 mg       ANTICOAG SUMMARY: Anticoagulation Episode Summary    Current INR goal 2.0-3.0  Next INR check 02/26/2016  INR from last check 2.10 (01/29/2016)  Weekly max dose   Target end date Indefinite  INR check location   Preferred lab   Send INR reminders to    Indications  CEREBROVASCULAR ACCIDENT ACUTE (Resolved) [I67.89] Long term current use of anticoagulant [Z79.01]        Comments         ANTICOAG TODAY: Anticoagulation Summary as of 01/29/2016    INR goal 2.0-3.0  Selected INR 2.10 (01/29/2016)  Next INR check 02/26/2016  Target end date Indefinite   Indications  CEREBROVASCULAR ACCIDENT 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  Patient instructed to take today's dose.  Patient verbalized understanding of these instructions.       FOLLOW-UP Return in 4 weeks (on 02/26/2016) for Follow up INR at 0845h.  [name], III Pharm.D., CACP

## 2016-02-26 ENCOUNTER — Ambulatory Visit (INDEPENDENT_AMBULATORY_CARE_PROVIDER_SITE_OTHER): Payer: Medicare Other | Admitting: Pharmacist

## 2016-02-26 DIAGNOSIS — Z8673 Personal history of transient ischemic attack (TIA), and cerebral infarction without residual deficits: Secondary | ICD-10-CM | POA: Diagnosis not present

## 2016-02-26 DIAGNOSIS — Z7901 Long term (current) use of anticoagulants: Secondary | ICD-10-CM | POA: Diagnosis not present

## 2016-02-26 LAB — POCT INR: INR: 2.9

## 2016-02-26 MED ORDER — WARFARIN SODIUM 3 MG PO TABS: ORAL_TABLET | ORAL | Status: DC

## 2016-02-26 NOTE — Patient Instructions (Signed)
Patient instructed to take medications as defined in the Anti-coagulation Track section of this encounter.  Patient instructed to take today's dose.  Patient verbalized understanding of these instructions.    

## 2016-02-26 NOTE — Progress Notes (Signed)
Anti-Coagulation Progress Note  Robert Newton is a 70 y.o. male who is currently on an anti-coagulation regimen.    RECENT RESULTS: Recent results are below, the most recent result is correlated with a dose of 18 mg. per week: Lab Results  Component Value Date   INR 2.90 02/26/2016   INR 2.10 01/29/2016   INR 2.20 01/01/2016    ANTI-COAG DOSE: Anticoagulation Dose Instructions as of 02/26/2016      Glynis SmilesSun Mon Tue Wed Thu Fri Sat   New Dose 3 mg 1.5 mg 3 mg 1.5 mg 3 mg 1.5 mg 3 mg       ANTICOAG SUMMARY: Anticoagulation Episode Summary    Current INR goal 2.0-3.0  Next INR check 03/25/2016  INR from last check 2.90 (02/26/2016)  Weekly max dose   Target end date Indefinite  INR check location   Preferred lab   Send INR reminders to    Indications  CEREBROVASCULAR ACCIDENT ACUTE (Resolved) [I67.89] Long term current use of anticoagulant [Z79.01]        Comments         ANTICOAG TODAY: Anticoagulation Summary as of 02/26/2016    INR goal 2.0-3.0  Selected INR 2.90 (02/26/2016)  Next INR check 03/25/2016  Target end date Indefinite   Indications  CEREBROVASCULAR ACCIDENT 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  Patient instructed to take today's dose.  Patient verbalized understanding of these instructions.       FOLLOW-UP Return in 4 weeks (on 03/25/2016) for Follow up INR at 0900h.  Hulen LusterJames Tayva Easterday, III Pharm.D., CACP

## 2016-02-29 NOTE — Progress Notes (Signed)
I have reviewed Dr. Saralyn PilarGroce's note.  [name] is on [facility] for CVA.  INR at high range of goal and his coumadin was decreased.

## 2016-03-07 ENCOUNTER — Encounter: Payer: Self-pay | Admitting: Internal Medicine

## 2016-03-25 ENCOUNTER — Ambulatory Visit (INDEPENDENT_AMBULATORY_CARE_PROVIDER_SITE_OTHER): Payer: Medicare Other | Admitting: Pharmacist

## 2016-03-25 DIAGNOSIS — Z7901 Long term (current) use of anticoagulants: Secondary | ICD-10-CM | POA: Diagnosis not present

## 2016-03-25 DIAGNOSIS — Z8673 Personal history of transient ischemic attack (TIA), and cerebral infarction without residual deficits: Secondary | ICD-10-CM | POA: Diagnosis not present

## 2016-03-25 LAB — POCT INR: INR: 2.1

## 2016-03-25 NOTE — Progress Notes (Signed)
Anti-Coagulation Progress Note  Robert Newton is a 70 y.o. male who is currently on an anti-coagulation regimen.    RECENT RESULTS: Recent results are below, the most recent result is correlated with a dose of 16.5 mg. per week: Lab Results  Component Value Date   INR 2.1 03/25/2016   INR 2.90 02/26/2016   INR 2.10 01/29/2016    ANTI-COAG DOSE: Anticoagulation Dose Instructions as of 03/25/2016      Glynis SmilesSun Mon Tue Wed Thu Fri Sat   New Dose 3 mg 1.5 mg 3 mg 3 mg 3 mg 1.5 mg 3 mg       ANTICOAG SUMMARY: Anticoagulation Episode Summary    Current INR goal:   2.0-3.0  TTR:   93.4 % (2.3 y)  Next INR check:   04/22/2016  INR from last check:   2.1 (03/25/2016)  Weekly max dose:     Target end date:   Indefinite  INR check location:     Preferred lab:     Send INR reminders to:      Indications   CEREBROVASCULAR ACCIDENT ACUTE (Resolved) [I67.89] Long term current use of anticoagulant [Z79.01]       Comments:           ANTICOAG TODAY: Anticoagulation Summary  As of 03/25/2016   INR goal:   2.0-3.0  TTR:     Today's INR:   2.1  Next INR check:   04/22/2016  Target end date:   Indefinite   Indications   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   FOLLOW-UP No Follow-up on file.  Hulen LusterJames Chief Walkup, III Pharm.D., CACP

## 2016-03-25 NOTE — Patient Instructions (Signed)
Patient instructed to take medications as defined in the Anti-coagulation Track section of this encounter.  Patient instructed to take today's dose.  Patient verbalized understanding of these instructions.    

## 2016-04-10 ENCOUNTER — Telehealth: Payer: Self-pay | Admitting: Internal Medicine

## 2016-04-10 NOTE — Telephone Encounter (Signed)
APT. REMINDER CALL, LMTCB °

## 2016-04-11 ENCOUNTER — Other Ambulatory Visit: Payer: Self-pay | Admitting: Internal Medicine

## 2016-04-11 ENCOUNTER — Ambulatory Visit (INDEPENDENT_AMBULATORY_CARE_PROVIDER_SITE_OTHER): Payer: Medicare Other | Admitting: Internal Medicine

## 2016-04-11 ENCOUNTER — Encounter: Payer: Self-pay | Admitting: Internal Medicine

## 2016-04-11 VITALS — BP 112/71 | HR 76 | Temp 97.9°F | Ht 73.0 in | Wt 129.1 lb

## 2016-04-11 DIAGNOSIS — Z1159 Encounter for screening for other viral diseases: Secondary | ICD-10-CM

## 2016-04-11 DIAGNOSIS — Z23 Encounter for immunization: Secondary | ICD-10-CM

## 2016-04-11 DIAGNOSIS — M79642 Pain in left hand: Secondary | ICD-10-CM | POA: Diagnosis not present

## 2016-04-11 DIAGNOSIS — Z299 Encounter for prophylactic measures, unspecified: Secondary | ICD-10-CM

## 2016-04-11 DIAGNOSIS — I10 Essential (primary) hypertension: Secondary | ICD-10-CM | POA: Diagnosis not present

## 2016-04-11 DIAGNOSIS — I69354 Hemiplegia and hemiparesis following cerebral infarction affecting left non-dominant side: Secondary | ICD-10-CM | POA: Diagnosis not present

## 2016-04-11 DIAGNOSIS — R319 Hematuria, unspecified: Secondary | ICD-10-CM

## 2016-04-11 DIAGNOSIS — F17210 Nicotine dependence, cigarettes, uncomplicated: Secondary | ICD-10-CM

## 2016-04-11 DIAGNOSIS — Z720 Tobacco use: Secondary | ICD-10-CM

## 2016-04-11 DIAGNOSIS — E785 Hyperlipidemia, unspecified: Secondary | ICD-10-CM | POA: Diagnosis not present

## 2016-04-11 NOTE — Assessment & Plan Note (Signed)
A: Patient reports adherence to his atorvastatin 40mg  daily.  P: - continue atorvastatin, refill sent to pharmacy.

## 2016-04-11 NOTE — Assessment & Plan Note (Addendum)
A: Has had negative work-up thus far with urology who recommended yearly follow up.  Last visit was July 2016.  No current symptoms of gross hematuria or dysuria.  Also had bilateral non-obstructive stones that were thought to be unlikely to be contributing to hematuria but urology recommended yearly surveillance as well.  P: - advised patient to follow up with urology for further management of hematuria and surveillance of stones.

## 2016-04-11 NOTE — Assessment & Plan Note (Signed)
BP Readings from Last 3 Encounters:  04/11/16 112/71  12/21/14 (!) 142/82  11/28/14 132/73   A: Controlled, not on any medications.  P: - no changes - advised to continue with healthy eating, attempt to cut-back on smoking.

## 2016-04-11 NOTE — Assessment & Plan Note (Signed)
A: He complains today of left forearm, wrist, and hand pain for about 2 months, but has raised the issue of left hand pain to previous PCP in April 2016 with no further follow up.  X-rays ordered at that time but do not appear to have been completed.  States feels alright now and some days it will hurt, other days it will not.  Cannot recall any factors that make it worse, but reports better with movement or using his hand.  Describes as achy with pain lasting 5-10 minutes.  Has not tried anything else for relief.  No trauma.  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  Pain is not worse at any particular time of day.  Physical exam of left hand with no obvious deformities, no joint tenderness on palpation, no contracture.  Negative Tinnel's and Phalen's.  Normal strength, ROM, and sensation.  Intact radial pulse.   P: - encouraged continued exercise of hand as he has history of CVA with residual left hemiparesis but no obvious contracture on exam. 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  Can consider ultrasound and possible injection in the future.

## 2016-04-11 NOTE — Assessment & Plan Note (Signed)
A: Patient continues to smoke about 1.5 packs every other day.  States not currently interested in quitting or cutting back.  P: - continue to address at subsequent visits.

## 2016-04-11 NOTE — Progress Notes (Signed)
CC: here for routine follow up and complaint of left hand/wrist pain  HPI:  [name] is a 70 y.o. man with a past medical history listed below here today for follow up of his CVA, GERD, hematuria.  He also complains today of left forearm, wrist, and hand pain for about 2 months.  States feels alright now and some days it will hurt, other days it will not.  Cannot recall any factors that make it worse, but reports better with movement or using his hand.  Describes as achy with pain lasting 5-10 minutes.  Has not tried anything else for relief.  No trauma.  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  Pain is not worse at any particular time of day.   For details of today's visit and the status of his chronic medical issues please refer to the assessment and plan.   Past Medical History:  Diagnosis Date  . Abdominal pain    RLQ  . Abnormal CT of the abdomen    abnormal lymph nodes  . Asthenia   . Carotid artery stenosis    right  . Erectile dysfunction   . Hyperlipidemia   . Hypertension   . Hypokalemia   . Hypokalemia   . Impetigo    hx of  . Low back pain   . PUD (peptic ulcer disease)    hx of  . Stroke [facility]) 2004   left hemiparesis  . Tobacco abuse     Review of Systems:   Review of Systems  Constitutional: Negative for chills and fever.  Respiratory: Negative for cough and sputum production.   Cardiovascular: Negative for chest pain.  Gastrointestinal: Negative for blood in stool, heartburn, melena, nausea and vomiting.  Genitourinary: Negative for dysuria and hematuria.  Musculoskeletal: Positive for joint pain.  Neurological: Negative for headaches.     Physical Exam:  Vitals:   04/11/16 1145  BP: 112/71  Pulse: 76  Temp: 97.9 F (36.6 C)  TempSrc: Oral  SpO2: 100%  Weight: 129 lb 1.6 oz (58.6 kg)  Height: 6\' 1"  (1.854 m)   Physical Exam  Constitutional: He is oriented to person, place, and time.  Pleasant, AA man,  sitting in wheelchair.  HENT:  Head: Normocephalic and atraumatic.  Eyes: EOM are normal.  Cardiovascular: Normal rate and regular rhythm.   Pulmonary/Chest: Effort normal.  Musculoskeletal:  Left hand with no obvious deformities, no joint tenderness on palpation.  Negative Tinnel's and Phalen's.  Normal strength, ROM, and sensation.  Intact radial pulse.  Neurological: He is alert and oriented to person, place, and time.  Skin: Skin is warm and dry.  Psychiatric: Mood and affect normal.    Assessment & Plan:   See Encounters Tab for problem based charting.  Patient discussed with Dr. Oswaldo Done   Essential hypertension BP Readings from Last 3 Encounters:  04/11/16 112/71  12/21/14 (!) 142/82  11/28/14 132/73   A: Controlled, not on any medications.  P: - no changes - advised to continue with healthy eating, attempt to cut-back on smoking.  Dyslipidemia A: Patient reports adherence to his atorvastatin 40mg  daily.  P: - continue atorvastatin, refill sent to pharmacy.  Tobacco abuse A: Patient continues to smoke about 1.5 packs every other day.  States not currently interested in quitting or cutting back.  P: - continue to address at subsequent visits.  Left hand pain A: He complains today of left forearm, wrist, and hand pain for  about 2 months, but has raised the issue of left hand pain to previous PCP in April 2016 with no further follow up.  X-rays ordered at that time but do not appear to have been completed.  States feels alright now and some days it will hurt, other days it will not.  Cannot recall any factors that make it worse, but reports better with movement or using his hand.  Describes as achy with pain lasting 5-10 minutes.  Has not tried anything else for relief.  No trauma.  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  Pain is not worse at any particular time of day.  Physical exam of left hand with no obvious deformities, no joint  tenderness on palpation, no contracture.  Negative Tinnel's and Phalen's.  Normal strength, ROM, and sensation.  Intact radial pulse.   P: - encouraged continued exercise of hand as he has history of CVA with residual left hemiparesis but no obvious contracture on exam. 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  Can consider ultrasound and possible injection in the future.  Hematuria A: Has had negative work-up thus far with urology who recommended yearly follow up.  Last visit was July 2016.  No current symptoms of gross hematuria or dysuria.  Also had bilateral non-obstructive stones that were thought to be unlikely to be contributing to hematuria but urology recommended yearly surveillance as well.  P: - advised patient to follow up with urology for further management of hematuria and surveillance of stones.  Preventive measure A/P: - check HCV antibody - stool cards given today due to patient preference- previous colonoscopy in 2006 with Dr. Leone PayorGessner.  He was due in May 2016 for follow up. - given flu vaccine - given prevnar 13 today.  Received dose of 23 greater than 1 year ago.

## 2016-04-11 NOTE — Addendum Note (Signed)
Addended by: Angelina OkHERBIN, GLADYS F on: 04/11/2016 02:36 PM   Modules accepted: Orders

## 2016-04-11 NOTE — Assessment & Plan Note (Signed)
A/P: - check HCV antibody - stool cards given today due to patient preference- previous colonoscopy in 2006 with Dr. Leone PayorGessner.  He was due in May 2016 for follow up. - given flu vaccine - given prevnar 13 today.  Received dose of 23 greater than 1 year ago.

## 2016-04-11 NOTE — Patient Instructions (Signed)
Thank you for coming to see me today. It was a pleasure. Today we talked about:   Please schedule an appointment to follow up with urology.  You saw them last July and they want to see you every year for right now.  I have refilled your medications.  Please go to the drug store and buy a splint for your wrist and hand pain.  I believe you have carpal tunnel syndrome.  You may also use Tylenol as needed and ice the area as well.  Please follow-up with me in 6 months.  If your hand pain is not better please come at any time to an appointment.  If you have any questions or concerns, please do not hesitate to call the office at [phone].  Take Care,   Robert BurlyAndrew Elwood Bazinet, DO

## 2016-04-12 LAB — HEPATITIS C ANTIBODY: Hep C Virus Ab: <0.1 s/co ratio (ref 0.0–0.9)

## 2016-04-12 NOTE — Progress Notes (Signed)
Internal Medicine Clinic Attending  Case discussed with Dr. Wallace at the time of the visit.  We reviewed the resident's history and exam and pertinent patient test results.  I agree with the assessment, diagnosis, and plan of care documented in the resident's note.  

## 2016-04-22 ENCOUNTER — Ambulatory Visit (INDEPENDENT_AMBULATORY_CARE_PROVIDER_SITE_OTHER): Payer: Medicare Other | Admitting: Pharmacist

## 2016-04-22 DIAGNOSIS — Z8673 Personal history of transient ischemic attack (TIA), and cerebral infarction without residual deficits: Secondary | ICD-10-CM

## 2016-04-22 DIAGNOSIS — Z7901 Long term (current) use of anticoagulants: Secondary | ICD-10-CM | POA: Diagnosis not present

## 2016-04-22 LAB — POCT INR: INR: 2.3

## 2016-04-22 NOTE — Progress Notes (Signed)
Anti-Coagulation Progress Note  Merilynn FinlandMack Harriss is a 70 y.o. male who is currently on an anti-coagulation regimen.    RECENT RESULTS: Recent results are below, the most recent result is correlated with a dose of 18 mg. per week: Lab Results  Component Value Date   INR 2.30 04/22/2016   INR 2.1 03/25/2016   INR 2.90 02/26/2016    ANTI-COAG DOSE: Anticoagulation Dose Instructions as of 04/22/2016      Glynis SmilesSun Mon Tue Wed Thu Fri Sat   New Dose 3 mg 1.5 mg 3 mg 3 mg 3 mg 1.5 mg 3 mg       ANTICOAG SUMMARY: Anticoagulation Episode Summary    Current INR goal:   2.0-3.0  TTR:   93.7 % (2.4 y)  Next INR check:   05/20/2016  INR from last check:   2.30 (04/22/2016)  Weekly max dose:     Target end date:   Indefinite  INR check location:     Preferred lab:     Send INR reminders to:      Indications   CEREBROVASCULAR ACCIDENT ACUTE (Resolved) [I67.89] Long term current use of anticoagulant [Z79.01]       Comments:           ANTICOAG TODAY: Anticoagulation Summary  As of 04/22/2016   INR goal:   2.0-3.0  TTR:     Today's INR:   2.30  Next INR check:   05/20/2016  Target end date:   Indefinite   Indications   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   FOLLOW-UP Return in 4 weeks (on 05/20/2016) for Follow up INR at 0915h.  Hulen LusterJames Marrisa Kimber, III Pharm.D., CACP

## 2016-04-22 NOTE — Patient Instructions (Signed)
Patient instructed to take medications as defined in the Anti-coagulation Track section of this encounter.  Patient instructed to take today's dose.  Patient verbalized understanding of these instructions.    

## 2016-04-22 NOTE — Progress Notes (Signed)
Indication: Carotid stenosis, Neurosurgery's recommendation. Duration: Indefinite. INR: At target. Dr. Saralyn PilarGroce's documentation reviewed.

## 2016-05-20 ENCOUNTER — Ambulatory Visit (INDEPENDENT_AMBULATORY_CARE_PROVIDER_SITE_OTHER): Payer: Medicare Other | Admitting: Pharmacist

## 2016-05-20 DIAGNOSIS — Z8673 Personal history of transient ischemic attack (TIA), and cerebral infarction without residual deficits: Secondary | ICD-10-CM

## 2016-05-20 DIAGNOSIS — Z7901 Long term (current) use of anticoagulants: Secondary | ICD-10-CM | POA: Diagnosis not present

## 2016-05-20 LAB — POCT INR: INR: 2.5

## 2016-05-20 NOTE — Patient Instructions (Signed)
Patient instructed to take medications as defined in the Anti-coagulation Track section of this encounter.  Patient instructed to take today's dose.  Patient verbalized understanding of these instructions.    

## 2016-05-20 NOTE — Progress Notes (Signed)
INTERNAL MEDICINE TEACHING ATTENDING ADDENDUM - Earl LagosNischal Jahlen Bollman M.D  Duration- indefinite, Indication- CVA, INR- therapeutic. Agree with pharmacy recommendations as outlined in their note.

## 2016-05-20 NOTE — Progress Notes (Signed)
Anti-Coagulation Progress Note  Robert Newton is a 70 y.o. male who is currently on an anti-coagulation regimen.    RECENT RESULTS: Recent results are below, the most recent result is correlated with a dose of 18 mg. per week: Lab Results  Component Value Date   INR 2.50 05/20/2016   INR 2.30 04/22/2016   INR 2.1 03/25/2016    ANTI-COAG DOSE: Anticoagulation Dose Instructions as of 05/20/2016      Glynis SmilesSun Mon Tue Wed Thu Fri Sat   New Dose 3 mg 1.5 mg 3 mg 3 mg 3 mg 1.5 mg 3 mg       ANTICOAG SUMMARY: Anticoagulation Episode Summary    Current INR goal:   2.0-3.0  TTR:   93.9 % (2.4 y)  Next INR check:   06/17/2016  INR from last check:   2.50 (05/20/2016)  Weekly max dose:     Target end date:   Indefinite  INR check location:     Preferred lab:     Send INR reminders to:      Indications   CEREBROVASCULAR ACCIDENT ACUTE (Resolved) [I67.89] Long term current use of anticoagulant [Z79.01]       Comments:           ANTICOAG TODAY: Anticoagulation Summary  As of 05/20/2016   INR goal:   2.0-3.0  TTR:     Today's INR:   2.50  Next INR check:   06/17/2016  Target end date:   Indefinite   Indications   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   FOLLOW-UP Return in 4 weeks (on 06/17/2016) for Follow up INR at 0900h.  Hulen LusterJames Chermaine Schnyder, III Pharm.D., CACP

## 2016-06-03 ENCOUNTER — Other Ambulatory Visit: Payer: Self-pay | Admitting: Internal Medicine

## 2016-06-17 ENCOUNTER — Ambulatory Visit (INDEPENDENT_AMBULATORY_CARE_PROVIDER_SITE_OTHER): Payer: Medicare Other | Admitting: Pharmacist

## 2016-06-17 DIAGNOSIS — Z8673 Personal history of transient ischemic attack (TIA), and cerebral infarction without residual deficits: Secondary | ICD-10-CM | POA: Diagnosis not present

## 2016-06-17 DIAGNOSIS — Z7901 Long term (current) use of anticoagulants: Secondary | ICD-10-CM | POA: Diagnosis not present

## 2016-06-17 DIAGNOSIS — I63039 Cerebral infarction due to thrombosis of unspecified carotid artery: Secondary | ICD-10-CM

## 2016-06-17 LAB — POCT INR: INR: 2.6

## 2016-06-17 MED ORDER — WARFARIN SODIUM 3 MG PO TABS: ORAL_TABLET | ORAL | 2 refills | Status: DC

## 2016-06-17 NOTE — Progress Notes (Signed)
INTERNAL MEDICINE TEACHING ATTENDING ADDENDUM - Earl LagosNischal Keyandre Pileggi M.D  Duration- indefinite, Indication- CVA, carotid stenois, INR- therapeutic. Agree with pharmacy recommendations as outlined in their note.

## 2016-06-17 NOTE — Patient Instructions (Signed)
Patient instructed to take medications as defined in the Anti-coagulation Track section of this encounter.  Patient instructed to take today's dose.  Patient instructed to take 1 tablet daily--EXCEPT on Mondays and Fridays, take only 1/2 tablet on those days.  Patient verbalized understanding of these instructions.

## 2016-06-17 NOTE — Progress Notes (Signed)
Anti-Coagulation Progress Note  Robert Newton is a 70 y.o. male who is currently on an anti-coagulation regimen.    RECENT RESULTS: Recent results are below, the most recent result is correlated with a dose of 18 mg. per week: Lab Results  Component Value Date   INR 2.60 06/17/2016   INR 2.50 05/20/2016   INR 2.30 04/22/2016    ANTI-COAG DOSE: Anticoagulation Dose Instructions as of 06/17/2016      Glynis SmilesSun Mon Tue Wed Thu Fri Sat   New Dose 3 mg 1.5 mg 3 mg 3 mg 3 mg 1.5 mg 3 mg    Description   Take 1 tablet daily--EXCEPT on Mondays and Fridays--take only 1/2 tablet on those days.       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] Long term current use of anticoagulant [Z79.01]       Comments:           ANTICOAG TODAY: Anticoagulation Summary  As of 06/17/2016   INR goal:   2.0-3.0  TTR:     Today's INR:   2.60  Next INR check:   07/22/2016  Target end date:   Indefinite   Indications   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  Patient instructed to take today's dose.  Patient instructed to take 1 tablet daily--EXCEPT on Mondays and Fridays, take only 1/2 tablet on those days.  Patient verbalized understanding of these instructions.     FOLLOW-UP Return in about 5 weeks (around 07/22/2016) for Follow up INR at  0845h.  Hulen LusterJames Groce, III Pharm.D., CACP

## 2016-07-22 ENCOUNTER — Encounter (INDEPENDENT_AMBULATORY_CARE_PROVIDER_SITE_OTHER): Payer: Self-pay

## 2016-07-22 ENCOUNTER — Ambulatory Visit (INDEPENDENT_AMBULATORY_CARE_PROVIDER_SITE_OTHER): Payer: Medicare Other | Admitting: Pharmacist

## 2016-07-22 DIAGNOSIS — Z7901 Long term (current) use of anticoagulants: Secondary | ICD-10-CM | POA: Diagnosis not present

## 2016-07-22 DIAGNOSIS — Z8673 Personal history of transient ischemic attack (TIA), and cerebral infarction without residual deficits: Secondary | ICD-10-CM | POA: Diagnosis not present

## 2016-07-22 LAB — POCT INR: INR: 2.1

## 2016-07-22 NOTE — Progress Notes (Signed)
Anti-Coagulation Progress Note  Robert Newton is a 70 y.o. male who is currently on an anti-coagulation regimen.    RECENT RESULTS: Recent results are below, the most recent result is correlated with a dose of 18 mg. per week: Lab Results  Component Value Date   INR 2.10 07/22/2016   INR 2.60 06/17/2016   INR 2.50 05/20/2016    ANTI-COAG DOSE: Anticoagulation Dose Instructions as of 07/22/2016      Glynis SmilesSun Mon Tue Wed Thu Fri Sat   New Dose 3 mg 3 mg 3 mg 3 mg 3 mg 1.5 mg 3 mg    Description   Take 1 tablet daily--EXCEPT on Fridays--take only 1/2 tablet.       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] Long term current use of anticoagulant [Z79.01]       Comments:           ANTICOAG TODAY: Anticoagulation Summary  As of 07/22/2016   INR goal:   2.0-3.0  TTR:     Today's INR:   2.10  Next INR check:   08/19/2016  Target end date:   Indefinite   Indications   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  Patient instructed to take today's dose.  Patient instructed to take 1 of his 5mg  warfarin, by mouth daily--EXCEPT on Fridays, then take only 1/2 tablet of your 5mg  strength warfarin tablet.  Patient verbalized understanding of these instructions.     FOLLOW-UP Return in 4 weeks (on 08/19/2016) for Follow up INR at  0845h.  Hulen LusterJames Zeniah Briney, III Pharm.D., CACP

## 2016-07-22 NOTE — Patient Instructions (Signed)
Patient instructed to take medications as defined in the Anti-coagulation Track section of this encounter.  Patient instructed to take today's dose.  Patient instructed to take 1 of his 5mg  warfarin, by mouth daily--EXCEPT on Fridays, then take only 1/2 tablet of your 5mg  strength warfarin tablet.  Patient verbalized understanding of these instructions.

## 2016-08-19 ENCOUNTER — Encounter (INDEPENDENT_AMBULATORY_CARE_PROVIDER_SITE_OTHER): Payer: Self-pay

## 2016-08-19 ENCOUNTER — Ambulatory Visit (INDEPENDENT_AMBULATORY_CARE_PROVIDER_SITE_OTHER): Payer: Medicare Other | Admitting: Pharmacist

## 2016-08-19 DIAGNOSIS — Z7901 Long term (current) use of anticoagulants: Secondary | ICD-10-CM

## 2016-08-19 DIAGNOSIS — Z8673 Personal history of transient ischemic attack (TIA), and cerebral infarction without residual deficits: Secondary | ICD-10-CM

## 2016-08-19 LAB — POCT INR: INR: 2.8

## 2016-08-19 NOTE — Progress Notes (Signed)
Anti-Coagulation Progress Note  Robert Newton is a 71 y.o. male who is currently on an anti-coagulation regimen.    RECENT RESULTS: Recent results are below, the most recent result is correlated with a dose of 19.5 mg. per week: Lab Results  Component Value Date   INR 2.80 08/19/2016   INR 2.10 07/22/2016   INR 2.60 06/17/2016    ANTI-COAG DOSE: Anticoagulation Dose Instructions as of 08/19/2016      Glynis SmilesSun Mon Tue Wed Thu Fri Sat   New Dose 3 mg 3 mg 3 mg 3 mg 3 mg 1.5 mg 3 mg    Description   Take 1/2 tablet on Tuesdays and Fridays--all other days, take 1 tablet.       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] Long term current use of anticoagulant [Z79.01]       Comments:           ANTICOAG TODAY: Anticoagulation Summary  As of 08/19/2016   INR goal:   2.0-3.0  TTR:     Today's INR:   2.80  Next INR check:   09/09/2016  Target end date:   Indefinite   Indications   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  Patient instructed to take today's dose.  Patient instructed to take 1/2 tablet on Tuesdays and Fridays--all other days, take 1 tablet of your 3mg  tan colored warfarin tablets.  Patient verbalized understanding of these instructions.       FOLLOW-UP Return in 3 weeks (on 09/09/2016) for Follow up INR at  0900h.  Hulen LusterJames Iline Buchinger, III Pharm.D., CACP

## 2016-08-19 NOTE — Patient Instructions (Signed)
Patient instructed to take medications as defined in the Anti-coagulation Track section of this encounter.  Patient instructed to take today's dose.  Patient instructed to take 1/2 tablet on Tuesdays and Fridays--all other days, take 1 tablet of your 3mg  tan colored warfarin tablets.  Patient verbalized understanding of these instructions.

## 2016-08-20 NOTE — Progress Notes (Signed)
INTERNAL MEDICINE TEACHING ATTENDING ADDENDUM - Gust RungErik C Hoffman, DO Duration- indefinate, Indication- CVA, carotid stenosis, INR-  Lab Results  Component Value Date   INR 2.80 08/19/2016  . Agree with pharmacy recommendations as outlined in their note.

## 2016-09-16 ENCOUNTER — Ambulatory Visit: Payer: Self-pay

## 2016-09-20 ENCOUNTER — Ambulatory Visit (INDEPENDENT_AMBULATORY_CARE_PROVIDER_SITE_OTHER): Payer: Medicare Other | Admitting: Pharmacist

## 2016-09-20 ENCOUNTER — Encounter (INDEPENDENT_AMBULATORY_CARE_PROVIDER_SITE_OTHER): Payer: Self-pay

## 2016-09-20 DIAGNOSIS — Z7901 Long term (current) use of anticoagulants: Secondary | ICD-10-CM | POA: Diagnosis not present

## 2016-09-20 DIAGNOSIS — I6529 Occlusion and stenosis of unspecified carotid artery: Secondary | ICD-10-CM | POA: Diagnosis not present

## 2016-09-20 LAB — POCT INR: INR: 3.6

## 2016-09-20 NOTE — Patient Instructions (Signed)
Patient educated about medication as defined in this encounter and verbalized understanding by repeating back instructions provided.   

## 2016-09-20 NOTE — Progress Notes (Signed)
Anticoagulation Management Robert FinlandMack Kellenberger is a 71 y.o. male who reports to the clinic for monitoring of warfarin treatment.    Indication : Patient is on chronic Coumadin therapy secondary to carotid artery stenosis as neurosurgery is not willing to discontinue his anticoagulation. Duration : indefinite Supervising physician: Cephas DarbyJames Granfortuna  Anticoagulation Clinic Visit History: Patient does not report signs/symptoms of bleeding or thromboembolism   Anticoagulation Episode Summary    Current INR goal:   2.0-3.0  TTR:   92.2 % (2.8 y)  Next INR check:   09/30/2016  INR from last check:   3.6! 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    Anticoagulation Dose Instructions as of 09/20/2016      Total Sun Mon Tue Wed Thu Fri Sat   New Dose 16.5 mg 3 mg 1.5 mg 3 mg 1.5 mg 3 mg 1.5 mg 3 mg     (3 mg x 1)  (3 mg x 0.5)  (3 mg x 1)  (3 mg x 0.5)  (3 mg x 1)  (3 mg x 0.5)  (3 mg x 1)                         Description   Take 1/2 tablet on Tuesdays and Fridays--all other days, take 1 tablet.      INR today: Supratherapeutic  PLAN Weekly dose was decreased by 15% to 16.5 mg per week  Patient Instructions  Patient educated about medication as defined in  this encounter and verbalized understanding by repeating back instructions provided.   Patient advised to contact clinic or seek medical attention if signs/symptoms of bleeding or thromboembolism occur.  Patient verbalized understanding by repeating back information and was advised to contact me if further medication-related questions arise. Patient was also provided an information handout.  Follow-up Return in about 10 days (around 09/30/2016) for Follow up INR 09/30/16 around 9am.  Marzetta BoardJennifer Kim

## 2016-09-20 NOTE — Progress Notes (Signed)
Reviewed Thanks DrG 

## 2016-10-03 ENCOUNTER — Other Ambulatory Visit: Payer: Self-pay | Admitting: Internal Medicine

## 2016-10-03 ENCOUNTER — Other Ambulatory Visit: Payer: Self-pay | Admitting: *Deleted

## 2016-10-03 MED ORDER — WARFARIN SODIUM 3 MG PO TABS: ORAL_TABLET | ORAL | 2 refills | Status: DC

## 2016-12-16 ENCOUNTER — Ambulatory Visit (INDEPENDENT_AMBULATORY_CARE_PROVIDER_SITE_OTHER): Payer: Medicare Other | Admitting: Pharmacist

## 2016-12-16 DIAGNOSIS — Z8673 Personal history of transient ischemic attack (TIA), and cerebral infarction without residual deficits: Secondary | ICD-10-CM

## 2016-12-16 DIAGNOSIS — Z7901 Long term (current) use of anticoagulants: Secondary | ICD-10-CM | POA: Diagnosis not present

## 2016-12-16 LAB — POCT INR: INR: 2.2

## 2016-12-16 NOTE — Progress Notes (Signed)
Anti-Coagulation Progress Note  Robert Newton is a 71 y.o. male who is currently on an anti-coagulation regimen for the indication of Cerebrovascular Accident, Acute (Resolved) [167.89] and long term use of anticoagulant [Z79.01]:      RECENT RESULTS: Recent results are below, the most recent result is correlated with a dose of 16.5 mg. per week: Lab Results  Component Value Date   INR 2.20 12/16/2016   INR 3.6 09/20/2016   INR 2.80 08/19/2016    ANTI-COAG DOSE: Anticoagulation Dose Instructions as of 12/16/2016      Glynis SmilesSun Mon Tue Wed Thu Fri Sat   New Dose 3 mg 1.5 mg 3 mg 1.5 mg 3 mg 1.5 mg 3 mg    Description   Take 1/2 tablet on Tuesdays and Fridays--all other days, take 1 tablet.       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] Long term current use of anticoagulant [Z79.01]       Comments:           ANTICOAG TODAY: Anticoagulation Summary  As of 12/16/2016   INR goal:   2.0-3.0  TTR:     Today's INR:   2.20  Next INR check:   01/13/2017  Target end date:   Indefinite   Indications   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  Patient instructed to take today's dose.  Patient instructed to take 1/2 of your 3mg  tan colored warfarin tablet(s) on Mondays, Wednesdays and Fridays; all other days--take ONE (1)  tablet of your 3mg  tan colored warfarin tablets.  Patient verbalized understanding of these instructions.        FOLLOW-UP Return for Follow up INR at 0845h.  Hulen LusterJames Christyna Letendre, III Pharm.D., CACP

## 2016-12-16 NOTE — Patient Instructions (Signed)
Patient instructed to take medications as defined in the Anti-coagulation Track section of this encounter.  Patient instructed to take today's dose.  Patient instructed to take 1/2 of your 3mg  tan colored warfarin tablet(s) on Mondays, Wednesdays and Fridays; all other days--take ONE (1) tablet of your 3mg  tan colored warfarin tablets.  Patient verbalized understanding of these instructions.

## 2016-12-18 NOTE — Progress Notes (Signed)
INTERNAL MEDICINE TEACHING ATTENDING ADDENDUM - Gust RungHoffman, Erik C, DO Duration- indefinate, Indication- carotid stenosis, inoperable, INR-  Lab Results  Component Value Date   INR 2.20 12/16/2016  . Agree with pharmacy recommendations as outlined in their note.

## 2016-12-27 ENCOUNTER — Other Ambulatory Visit: Payer: Self-pay | Admitting: Internal Medicine

## 2016-12-31 ENCOUNTER — Other Ambulatory Visit: Payer: Self-pay | Admitting: Internal Medicine

## 2017-01-13 ENCOUNTER — Ambulatory Visit (INDEPENDENT_AMBULATORY_CARE_PROVIDER_SITE_OTHER): Payer: Medicare Other | Admitting: Pharmacist

## 2017-01-13 DIAGNOSIS — Z7901 Long term (current) use of anticoagulants: Secondary | ICD-10-CM

## 2017-01-13 DIAGNOSIS — Z8673 Personal history of transient ischemic attack (TIA), and cerebral infarction without residual deficits: Secondary | ICD-10-CM

## 2017-01-13 LAB — POCT INR: INR: 2.9

## 2017-01-13 NOTE — Patient Instructions (Signed)
Patient instructed to take medications as defined in the Anti-coagulation Track section of this encounter.  Patient instructed to take today's dose.  Patient instructed to take 1/2 tablet on all days of the week--EXCEPT on Thursdays and Saturdays, take 1 tablet on Thursdays and Saturdays. . Patient verbalized understanding of these instructions.

## 2017-01-13 NOTE — Progress Notes (Signed)
INTERNAL MEDICINE TEACHING ATTENDING ADDENDUM - Shamell Hittle M.D  Duration- indefinite, Indication- carotid artery stenosis, CVA, INR- therapeutic. Agree with pharmacy recommendations as outlined in their note.

## 2017-01-13 NOTE — Progress Notes (Signed)
Anti-Coagulation Progress Note  Robert Newton is a 71 y.o. male who is currently on an anti-coagulation regimen.    RECENT RESULTS: Recent results are below, the most recent result is correlated with a dose of 16.5 mg. per week for the indication of cerebrovascular accident, acute (resolved) [167.89] and long term use of anticoagulant [Z79.01] that currently shows an indefinite duration of therapy. Annual discussion with patient to focus upon risks vs benefit of continued embolic stroke risk recurrence avoidance with warfarin and potential for bleeding complications is suggested in the oral anticoagulation management section of the guidelines of the [employer].  Lab Results  Component Value Date   INR 2.90 01/13/2017   INR 2.20 12/16/2016   INR 3.6 09/20/2016    ANTI-COAG DOSE: Anticoagulation Warfarin Dose Instructions as of 01/13/2017      Glynis SmilesSun Mon Tue Wed Thu Fri Sat   New Dose 1.5 mg 1.5 mg 1.5 mg 1.5 mg 3 mg 1.5 mg 3 mg    Description   Take 1/2 tablet on all days of the week--EXCEPT on Thursdays and Saturdays, take 1 tablet on Thursdays and Saturdays. Isla Pence.       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] Long term current use of anticoagulant [Z79.01]       Comments:           ANTICOAG TODAY: Anticoagulation Summary  As of 01/13/2017   INR goal:   2.0-3.0  TTR:     Today's INR:   2.90  Next INR check:   02/10/2017  Target end date:   Indefinite   Indications   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  Patient instructed to take today's dose.  Patient instructed to take 1/2 tablet on all days of the week--EXCEPT on Thursdays and Saturdays, take 1 tablet on Thursdays and Saturdays. . Patient verbalized understanding of these instructions.       FOLLOW-UP Return in 4 weeks (on 02/10/2017) for Follow up INR at 0900h.  Hulen LusterJames Liany Mumpower, III Pharm.D., CACP

## 2017-02-04 ENCOUNTER — Other Ambulatory Visit: Payer: Self-pay | Admitting: *Deleted

## 2017-02-05 MED ORDER — WARFARIN SODIUM 3 MG PO TABS: ORAL_TABLET | ORAL | 2 refills | Status: DC

## 2017-02-10 ENCOUNTER — Ambulatory Visit (INDEPENDENT_AMBULATORY_CARE_PROVIDER_SITE_OTHER): Payer: Medicare Other | Admitting: Pharmacist

## 2017-02-10 DIAGNOSIS — Z8673 Personal history of transient ischemic attack (TIA), and cerebral infarction without residual deficits: Secondary | ICD-10-CM

## 2017-02-10 DIAGNOSIS — Z7901 Long term (current) use of anticoagulants: Secondary | ICD-10-CM

## 2017-02-10 LAB — POCT INR: INR: 2

## 2017-02-10 NOTE — Progress Notes (Signed)
Anticoagulation Management Robert Newton is a 71 y.o. male who reports to the clinic for monitoring of warfarin treatment.    Indication: CVA  Duration: indefinite Supervising physician: Carlynn PurlErik Hoffman  Anticoagulation Clinic Visit History: Patient does not report signs/symptoms of bleeding or thromboembolism  Other recent changes: No diet, medications, lifestyle changes endorsed by patient.  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] Long term current use of anticoagulant [Z79.01]       Comments:          ASSESSMENT Recent Results: The most recent result is correlated with 13.5 mg per week: Lab Results  Component Value Date   INR 2.00 02/10/2017   INR 2.90 01/13/2017   INR 2.20 12/16/2016    Anticoagulation Dosing: INR as of 02/10/2017 and Previous Warfarin Dosing Information    INR Dt INR Goal [facility] Tot Sun Mon Tue Wed Thu Fri Sat   02/10/2017 2.00 2.0-3.0 13.5 mg 1.5 mg 1.5 mg 1.5 mg 1.5 mg 3 mg 1.5 mg 3 mg    Previous description   Take 1/2 tablet on all days of the week--EXCEPT on Thursdays and Saturdays, take 1 tablet on Thursdays and Saturdays. .    Anticoagulation Warfarin Dose Instructions as of 02/10/2017      Total Sun Mon Tue Wed Thu Fri Sat   New Dose 15 mg 1.5 mg 3 mg 1.5 mg 3 mg 1.5 mg 3 mg 1.5 mg     (3 mg x 0.5)  (3 mg x 1)  (3 mg x 0.5)  (3 mg x 1)  (3 mg x 0.5)  (3 mg x 1)  (3 mg x 0.5)                         Description   Take 1/2 tablet on all days of the week--EXCEPT on Mondays,Wednesdays, and Fridays--take 1 tablet on Mondays, Wednesdays and Fridays.      INR today: Therapeutic  PLAN Weekly dose was increased by 12% to 15 mg per  week  Patient Instructions  Patient instructed to take medications as defined in the Anti-coagulation Track section of this encounter.  Patient instructed to take today's dose.  Patient instructed to take 1/2 tablet on all days of the week--EXCEPT on Mondays,Wednesdays, and Fridays--take 1 tablet on Mondays, Wednesdays and Fridays. Patient verbalized understanding of these instructions.     Patient advised to contact clinic or seek medical attention if signs/symptoms of bleeding or thromboembolism occur.  Patient verbalized understanding by repeating back information and was advised to contact me if further medication-related questions arise. Patient was also provided an information handout.  Follow-up Return in 4 weeks (on 03/10/2017) for Follow up INR at 0945h.  [name], [name] PharmD, CACP, CPP  15 minutes spent face-to-face with the patient during the encounter. 50% of time spent on education. 50% of time was spent on finger-stick point of care INR sample collection, processing, interpretation, data entry into EPIC/CHL and [email or web address].

## 2017-02-10 NOTE — Patient Instructions (Signed)
Patient instructed to take medications as defined in the Anti-coagulation Track section of this encounter.  Patient instructed to take today's dose.  Patient instructed to take 1/2 tablet on all days of the week--EXCEPT on Mondays,Wednesdays, and Fridays--take 1 tablet on Mondays, Wednesdays and Fridays. Patient verbalized understanding of these instructions.

## 2017-02-10 NOTE — Progress Notes (Signed)
INTERNAL MEDICINE TEACHING ATTENDING ADDENDUM - Gust RungHoffman, Zareena Willis C, DO Duration- indefinate, Indication- CVA, inoperable carotid stenosis, INR-  Lab Results  Component Value Date   INR 2.00 02/10/2017  . Agree with pharmacy recommendations as outlined in their note.

## 2017-02-16 ENCOUNTER — Other Ambulatory Visit: Payer: Self-pay | Admitting: Internal Medicine

## 2017-03-10 ENCOUNTER — Ambulatory Visit (INDEPENDENT_AMBULATORY_CARE_PROVIDER_SITE_OTHER): Payer: PPO | Admitting: Pharmacist

## 2017-03-10 ENCOUNTER — Other Ambulatory Visit: Payer: Self-pay | Admitting: *Deleted

## 2017-03-10 DIAGNOSIS — Z7901 Long term (current) use of anticoagulants: Secondary | ICD-10-CM

## 2017-03-10 DIAGNOSIS — Z8673 Personal history of transient ischemic attack (TIA), and cerebral infarction without residual deficits: Secondary | ICD-10-CM

## 2017-03-10 LAB — POCT INR: INR: 2.1

## 2017-03-10 MED ORDER — WARFARIN SODIUM 3 MG PO TABS: ORAL_TABLET | ORAL | 2 refills | Status: DC

## 2017-03-10 NOTE — Patient Instructions (Signed)
Patient instructed to take medications as defined in the Anti-coagulation Track section of this encounter.  Patient instructed to take today's dose.  Patient instructed to take 1/2 tablet on Thursdays, Saturdays and Sundays; all other days, take one (1) tablet of your 3mg  tan-colored warfarin tablets.  Patient verbalized understanding of these instructions.

## 2017-03-10 NOTE — Progress Notes (Signed)
Anticoagulation Management Robert Newton is a 71 y.o. male who reports to the clinic for monitoring of warfarin treatment.    Indication: CVA--resolved with sequelae; continued long term use of anticoagulation. Duration: indefinite Supervising physician: Robert Newton  Anticoagulation Clinic Visit History: Patient does not report signs/symptoms of bleeding or thromboembolism  Other recent changes: No diet, medications, lifestyle changes endorsed by the patient.  Anticoagulation Episode Summary    Current INR goal:   2.0-3.0  TTR:   90.2 % (3.2 y)  Next INR check:   04/07/2017  INR from last check:   2.10 (03/10/2017)  Weekly max warfarin dose:     Target end date:   Indefinite  INR check location:     Preferred lab:     Send INR reminders to:      Indications   CEREBROVASCULAR ACCIDENT ACUTE (Resolved) [I67.89] Long term current use of anticoagulant [Z79.01]       Comments:           No Known Allergies Prior to Admission medications   Medication Sig Start Date End Date Taking? Authorizing Provider  aspirin 81 MG EC tablet Take 81 mg by mouth daily.     Yes [provider]  atorvastatin (LIPITOR) 40 MG tablet TAKE 1 TABLET (40 MG TOTAL) BY MOUTH EVERY EVENING. 02/18/17  Yes Robert Newton, Andrew, DO  omeprazole (PRILOSEC) 20 MG capsule TAKE 1 CAPSULE (20 MG TOTAL) BY MOUTH AT BEDTIME. 02/18/17  Yes Robert Newton, Andrew, DO  warfarin (COUMADIN) 3 MG tablet Take 1/2 tablet on Thursdays, Saturdays and Sundays; all other days--take one (1) tablet. 03/10/17  Yes Robert Newton, Robert B, MD  docusate sodium (COLACE) 100 MG capsule Take 100 mg by mouth daily.      [provider]   Past Medical History:  Diagnosis Date  . Abdominal pain    RLQ  . Abnormal CT of the abdomen    abnormal lymph nodes  . Asthenia   . Carotid artery stenosis    right  . Erectile dysfunction   . Hyperlipidemia   . Hypertension   . Hypokalemia   . Hypokalemia   . Impetigo    hx of  . Low back pain    . PUD (peptic ulcer disease)    hx of  . 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 Tobacco abuse    Social History   Social History  . Marital status: Married    Spouse name: N/A  . Number of children: N/A  . Years of education: N/A   Social History Main Topics  . Smoking status: Light Tobacco Smoker    Packs/day: 0.50    Types: Cigarettes  . Smokeless tobacco: Former NeurosurgeonUser    Quit date: 08/12/1972  . Alcohol use No  . Drug use: No  . Sexual activity: Not on file   Other Topics Concern  . Not on file   Social History Narrative   Married, wife has multiple chronic medical issuesMichaelle Newton( [facility])   Family History  Problem Relation Age of Onset  . Hypertension Mother   . Hypertension Sister   . Diabetes Sister   . Stroke Father     ASSESSMENT Recent Results: The most recent result is correlated with 15 mg per week: Lab Results  Component Value Date   INR 2.10 03/10/2017   INR 2.00 02/10/2017   INR 2.90 01/13/2017    Anticoagulation Dosing: INR as of 03/10/2017 and Previous Warfarin Dosing Information  INR Dt INR Goal Robert Newton Tot Sun Mon Tue Robert BitterWed Thu Fri Sat   03/10/2017 2.10 2.0-3.0 15 mg 1.5 mg 3 mg 1.5 mg 3 mg 1.5 mg 3 mg 1.5 mg    Previous description   Take 1/2 tablet on all days of the week--EXCEPT on Mondays,Wednesdays, and Fridays--take 1 tablet on Mondays, Wednesdays and Fridays.    Anticoagulation Warfarin Dose Instructions as of 03/10/2017      Total Sun Mon Tue Wed Thu Fri Sat   New Dose 16.5 mg 1.5 mg 3 mg 3 mg 3 mg 1.5 mg 3 mg 1.5 mg     (3 mg x 0.5)  (3 mg x 1)  (3 mg x 1)  (3 mg x 1)  (3 mg x 0.5)  (3 mg x 1)  (3 mg x 0.5)                         Description   Take 1/2 tablet on Thursdays, Saturdays and Sundays; all other days, take one (1) tablet of your 3mg  tan-colored warfarin tablets.      INR today: Therapeutic  PLAN Weekly dose was increased by 10% to 16.5 mg per week  Patient Instructions  Patient instructed to take  medications as defined in the Anti-coagulation Track section of this encounter.  Patient instructed to take today's dose.  Patient instructed to take 1/2 tablet on Thursdays, Saturdays and Sundays; all other days, take one (1) tablet of your 3mg  tan-colored warfarin tablets.  Patient verbalized understanding of these instructions.     Patient advised to contact clinic or seek medical attention if signs/symptoms of bleeding or thromboembolism occur.  Patient verbalized understanding by repeating back information and was advised to contact me if further medication-related questions arise. Patient was also provided an information handout.  Follow-up Return in 4 weeks (on 04/07/2017) for Follow up INR at 0945h.  Robert Newton, Robert Newton PharmD, CACP, CPP  15 minutes spent face-to-face with the patient during the encounter. 50% of time spent on education. 50% of time was spent on point of care finger stick INR sample collection, processing, results interpretation and data entry in to EPIC/CHL and [email or web address].

## 2017-03-11 NOTE — Progress Notes (Signed)
I reviewed Dr. Saralyn PilarGroce's note.  [facility].  INR at low end of goal and dose increased.

## 2017-03-27 ENCOUNTER — Ambulatory Visit (INDEPENDENT_AMBULATORY_CARE_PROVIDER_SITE_OTHER): Payer: PPO | Admitting: Internal Medicine

## 2017-03-27 ENCOUNTER — Encounter: Payer: Self-pay | Admitting: Internal Medicine

## 2017-03-27 ENCOUNTER — Encounter: Payer: Self-pay | Admitting: *Deleted

## 2017-03-27 VITALS — BP 136/75 | HR 82 | Temp 97.5°F | Ht 71.0 in | Wt 117.2 lb

## 2017-03-27 DIAGNOSIS — R634 Abnormal weight loss: Secondary | ICD-10-CM | POA: Insufficient documentation

## 2017-03-27 DIAGNOSIS — Z1211 Encounter for screening for malignant neoplasm of colon: Secondary | ICD-10-CM

## 2017-03-27 DIAGNOSIS — F17210 Nicotine dependence, cigarettes, uncomplicated: Secondary | ICD-10-CM | POA: Diagnosis not present

## 2017-03-27 DIAGNOSIS — I10 Essential (primary) hypertension: Secondary | ICD-10-CM

## 2017-03-27 DIAGNOSIS — Z299 Encounter for prophylactic measures, unspecified: Secondary | ICD-10-CM

## 2017-03-27 NOTE — Assessment & Plan Note (Signed)
BP Readings from Last 3 Encounters:  03/27/17 136/75  04/11/16 112/71  12/21/14 (!) 142/82   A: BP well-controlled, not on medications currently.  No complaints of headaches, lightheadedness, or CP.  P: - no changes, continue to monitor

## 2017-03-27 NOTE — Assessment & Plan Note (Signed)
A/P: He is due for colon cancer screening today.  Previous colonoscopy in 2016 with Dr. Leone PayorGessner.  He is agreeable to screening and so referral to GI placed today.

## 2017-03-27 NOTE — Progress Notes (Signed)
   CC: here for follow up of health care maintenance  HPI:  Robert Newton is a 71 y.o. man with a past medical history listed below here today for follow up of his healthcare maintenance.   For details of today's visit and the status of his chronic medical issues please refer to the assessment and plan.   Past Medical History:  Diagnosis Date  . Abdominal pain    RLQ  . Abnormal CT of the abdomen    abnormal lymph nodes  . Asthenia   . Carotid artery stenosis    right  . Erectile dysfunction   . Hyperlipidemia   . Hypertension   . Hypokalemia   . Hypokalemia   . Impetigo    hx of  . Low back pain   . PUD (peptic ulcer disease)    hx of  . 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 Tobacco abuse    Review of Systems:  Please see pertinent ROS reviewed in HPI and problem based charting.   Physical Exam:  Vitals:   03/27/17 1337  BP: 136/75  Pulse: 82  Temp: (!) 97.5 F (36.4 C)  TempSrc: Oral  SpO2: 100%  Weight: 117 lb 3.2 oz (53.2 kg)  Height: 5\' 11"  (1.803 m)   General: thin appearing, AAM, sitting in chair HEENT: [place]/AT, EOMI, no scleral icterus Cardiac: RRR, no rubs, murmurs or gallops Pulm: clear to auscultation bilaterally, moving normal volumes of air Abd: soft, nontender, nondistended, BS present Ext: warm and well perfused, no pedal edema Neuro: alert and oriented X3, cranial nerves II-XII grossly intact   Assessment & Plan:   See Encounters Tab for problem based charting.  Patient discussed with Dr. Criselda PeachesMullen   Essential hypertension BP Readings from Last 3 Encounters:  03/27/17 136/75  04/11/16 112/71  12/21/14 (!) 142/82   A: BP well-controlled, not on medications currently.  No complaints of headaches, lightheadedness, or CP.  P: - no changes, continue to monitor  Preventive measure A/P: He is due for colon cancer screening today.  Previous colonoscopy in 2016 with Dr. Leone PayorGessner.  He is agreeable to screening and so referral to GI placed  today.  Weight loss A: [name] has lost about 12 pounds in 1 year.  He reports no intended weight loss, just that he has never had much of an appetite.  I wonder if there is an issue with access to food as he was given crackers, banana, drink, and sandwich while at visit today.  I, however, am concerned about his weight loss and requested labs today.  Patient declined further lab work at visit today.  He denies any night sweats, fever, chills, melena, early satiety, cough, or other alarming symptoms.  He is without complaints today.  He reports he followed up with urology for his hematuria and describes a cystoscopy procedure.  Was told everything was fine.  P: - at follow up, will attempt to obtain CMET, CBC, PSA.  Given his smoking history, may also be worthwhile to obtain CT lung cancer screening

## 2017-03-27 NOTE — Patient Instructions (Addendum)
Thank you for coming to see me today. It was a pleasure. Today we talked about:   Screening for colon cancer: I have referred you to have a colonoscopy.    Please follow-up with me in  3 months   If you have any questions or concerns, please do not hesitate to call the office at [phone].  Take Care,   Gwynn BurlyAndrew Anjelo Pullman, DO

## 2017-03-27 NOTE — Assessment & Plan Note (Signed)
A: [name] has lost about 12 pounds in 1 year.  He reports no intended weight loss, just that he has never had much of an appetite.  I wonder if there is an issue with access to food as he was given crackers, banana, drink, and sandwich while at visit today.  I, however, am concerned about his weight loss and requested labs today.  Patient declined further lab work at visit today.  He denies any night sweats, fever, chills, melena, early satiety, cough, or other alarming symptoms.  He is without complaints today.  He reports he followed up with urology for his hematuria and describes a cystoscopy procedure.  Was told everything was fine.  P: - at follow up, will attempt to obtain CMET, CBC, PSA.  Given his smoking history, may also be worthwhile to obtain CT lung cancer screening

## 2017-04-02 NOTE — Progress Notes (Signed)
Internal Medicine Clinic Attending  Case discussed with Dr. Wallace at the time of the visit.  We reviewed the resident's history and exam and pertinent patient test results.  I agree with the assessment, diagnosis, and plan of care documented in the resident's note.  

## 2017-04-07 ENCOUNTER — Ambulatory Visit: Payer: Self-pay

## 2017-05-02 ENCOUNTER — Encounter: Payer: Self-pay | Admitting: Internal Medicine

## 2017-05-02 ENCOUNTER — Other Ambulatory Visit: Payer: Self-pay | Admitting: *Deleted

## 2017-05-02 MED ORDER — WARFARIN SODIUM 3 MG PO TABS: ORAL_TABLET | ORAL | 2 refills | Status: DC

## 2017-05-02 NOTE — Addendum Note (Signed)
Addended by: Neomia Dear on: 05/02/2017 05:33 PM   Modules accepted: Orders

## 2017-05-12 ENCOUNTER — Ambulatory Visit: Payer: Self-pay

## 2017-05-19 ENCOUNTER — Ambulatory Visit (INDEPENDENT_AMBULATORY_CARE_PROVIDER_SITE_OTHER): Payer: PPO

## 2017-05-19 DIAGNOSIS — Z7901 Long term (current) use of anticoagulants: Secondary | ICD-10-CM

## 2017-05-19 DIAGNOSIS — I639 Cerebral infarction, unspecified: Secondary | ICD-10-CM

## 2017-05-19 DIAGNOSIS — Z8673 Personal history of transient ischemic attack (TIA), and cerebral infarction without residual deficits: Secondary | ICD-10-CM | POA: Diagnosis not present

## 2017-05-19 LAB — POCT INR: INR: 2.3

## 2017-05-19 NOTE — Progress Notes (Signed)
Anticoagulation Management Robert Newton is a 70 y.o. male who reports to the clinic for monitoring of warfarin treatment.    Indication: S/P CVA, long term anticoagulation for prevention of recurrence Duration: indefinite Supervising physician: Blanch Media  Anticoagulation Clinic Visit History: Patient does not report signs/symptoms of bleeding or thromboembolism. Other recent changes: Patient denies any diet, medications, or lifestyle changes. Anticoagulation Episode Summary    Current INR goal:   2.0-3.0  TTR:   90.7 % (3.4 y)  Next INR check:   06/16/2017  INR from last check:   2.3 (05/19/2017)  Weekly max warfarin dose:     Target end date:   Indefinite  INR check location:     Preferred lab:     Send INR reminders to:      Indications   CEREBROVASCULAR ACCIDENT ACUTE (Resolved) [I67.89] Long term current use of anticoagulant [Z79.01]       Comments:           No Known Allergies Prior to Admission medications   Medication Sig Start Date End Date Taking? Authorizing Provider  aspirin 81 MG EC tablet Take 81 mg by mouth daily.     Yes [provider]  atorvastatin (LIPITOR) 40 MG tablet TAKE 1 TABLET (40 MG TOTAL) BY MOUTH EVERY EVENING. 02/18/17  Yes Gwynn Burly, DO  docusate sodium (COLACE) 100 MG capsule Take 100 mg by mouth daily.     Yes [provider]  omeprazole (PRILOSEC) 20 MG capsule TAKE 1 CAPSULE (20 MG TOTAL) BY MOUTH AT BEDTIME. 02/18/17  Yes Gwynn Burly, DO  warfarin (COUMADIN) 3 MG tablet Take 1/2 tablet on Thursdays, Saturdays and Sundays; all other days, take one (1) tablet 05/02/17  Yes Gwynn Burly, DO   Past Medical History:  Diagnosis Date  . Abdominal pain    RLQ  . Abnormal CT of the abdomen    abnormal lymph nodes  . Asthenia   . Carotid artery stenosis    right  . Erectile dysfunction   . Hyperlipidemia   . Hypertension   . Hypokalemia   . Hypokalemia   . Impetigo    hx of  . Low back pain   . PUD  (peptic ulcer disease)    hx of  . Stroke [facility]) 2004   left hemiparesis  . Tobacco abuse    Social History   Social History  . Marital status: Married    Spouse name: N/A  . Number of children: N/A  . Years of education: N/A   Social History Main Topics  . Smoking status: Light Tobacco Smoker    Packs/day: 0.30    Types: Cigarettes  . Smokeless tobacco: Former Neurosurgeon    Quit date: 08/12/1972     Comment: 1/3  pak per day  . Alcohol use No  . Drug use: No  . Sexual activity: Not on file   Other Topics Concern  . Not on file   Social History Narrative   Married, wife has multiple chronic medical issuesMichaelle Birks [facility] patient)   Family History  Problem Relation Age of Onset  . Hypertension Mother   . Hypertension Sister   . Diabetes Sister   . Stroke Father     ASSESSMENT Recent Results: The most recent result is correlated with 16.5 mg per week: Lab Results  Component Value Date   INR 2.3 05/19/2017   INR 2.10 03/10/2017   INR 2.00 02/10/2017    Anticoagulation Dosing: INR as of 05/19/2017  and Previous Warfarin Dosing Information    INR Dt INR Goal Jacki Cones Sun Mon Tue Wed Thu Fri Sat   05/19/2017 2.3 2.0-3.0 16.5 mg 1.5 mg 3 mg 3 mg 3 mg 1.5 mg 3 mg 1.5 mg    Previous description   Take 1/2 tablet on Thursdays, Saturdays and Sundays; all other days, take one (1) tablet of your  tan-colored warfarin tablets.    Anticoagulation Warfarin Dose Instructions as of 05/19/2017      Total Sun Mon Tue Wed Thu Fri Sat   New Dose 16.5 mg 1.5 mg 3 mg 3 mg 3 mg 1.5 mg 3 mg 1.5 mg     (3 mg x 0.5)  (3 mg x 1)  (3 mg x 1)  (3 mg x 1)  (3 mg x 0.5)  (3 mg x 1)  (3 mg x 0.5)                         Description   Take 1/2 tablet on Thursdays, Saturdays and Sundays; all other days, take one (1) tablet of your  tan-colored warfarin tablets.      INR today: Therapeutic  PLAN Weekly dose was unchanged.  Patient Instructions  Patient instructed to take  medications as defined in the Anti-coagulation Track section of this encounter.  Patient instructed to take today's dose.  Patient was instructed to take 1/2 tablet on Thursdays, Saturdays and Sundays; all other days, take one (1) tablet of your  tan-colored warfarin tablets.  Patient verbalized understanding of these instructions.     Patient advised to contact clinic or seek medical attention if signs/symptoms of bleeding or thromboembolism occur.  Patient verbalized understanding by repeating back information and was advised to contact me if further medication-related questions arise. Patient was also provided an information handout.  Follow-up Return in about 4 weeks (around 06/16/2017) for INR follow up at 1100.  Nolen Mu PharmD PGY1 Pharmacy Practice Resident 05/19/2017 12:12 PM Pager: [phone]  15 minutes spent face-to-face with the patient during the encounter. 50% of time spent on education. 50% of time was spent on INR point of care testing, collection of blood sample, analysis of results, dose adjustments, and documentation.

## 2017-05-19 NOTE — Patient Instructions (Signed)
Patient instructed to take medications as defined in the Anti-coagulation Track section of this encounter.  Patient instructed to take today's dose.  Patient was instructed to take 1/2 tablet on Thursdays, Saturdays and Sundays; all other days, take one (1) tablet of your  tan-colored warfarin tablets.  Patient verbalized understanding of these instructions.

## 2017-06-16 ENCOUNTER — Ambulatory Visit: Payer: Self-pay

## 2017-07-21 ENCOUNTER — Ambulatory Visit: Payer: Self-pay

## 2017-07-31 ENCOUNTER — Ambulatory Visit (INDEPENDENT_AMBULATORY_CARE_PROVIDER_SITE_OTHER): Payer: PPO | Admitting: Pharmacist

## 2017-07-31 ENCOUNTER — Encounter (INDEPENDENT_AMBULATORY_CARE_PROVIDER_SITE_OTHER): Payer: Self-pay

## 2017-07-31 DIAGNOSIS — Z8673 Personal history of transient ischemic attack (TIA), and cerebral infarction without residual deficits: Secondary | ICD-10-CM

## 2017-07-31 DIAGNOSIS — Z7901 Long term (current) use of anticoagulants: Secondary | ICD-10-CM | POA: Diagnosis not present

## 2017-07-31 LAB — POCT INR: INR: 1.6

## 2017-07-31 MED ORDER — WARFARIN SODIUM 3 MG PO TABS: ORAL_TABLET | ORAL | 2 refills | Status: DC

## 2017-07-31 NOTE — Progress Notes (Signed)
Anticoagulation Management Robert Newton is a 71 y.o. male who reports to the clinic for monitoring of warfarin treatment.    Indication: S/P CVA, long term anticoagulation for prevention of recurrence  Duration: indefinite Supervising physician: Robert MediaElizabeth Newton  Anticoagulation Clinic Visit History: Patient does not report signs/symptoms of bleeding or thromboembolism. Other recent changes: Patient denies any diet, medications, or lifestyle changes.  Anticoagulation Episode Summary    Current INR goal:   2.0-3.0  TTR:   88.1 % (3.6 y)  Next INR check:   08/14/2017  INR from last check:   1.6! (07/31/2017)  Weekly max warfarin dose:     Target end date:   Indefinite  INR check location:     Preferred lab:     Send INR reminders to:      Indications   CEREBROVASCULAR ACCIDENT ACUTE (Resolved) [I67.89] Long term current use of anticoagulant [Z79.01]       Comments:          No Known Allergies Medication Sig  aspirin 81 MG EC tablet Take 81 mg by mouth daily.    atorvastatin (LIPITOR) 40 MG tablet TAKE 1 TABLET (40 MG TOTAL) BY MOUTH EVERY EVENING.  docusate sodium (COLACE) 100 MG capsule Take 100 mg by mouth daily.    omeprazole (PRILOSEC) 20 MG capsule TAKE 1 CAPSULE (20 MG TOTAL) BY MOUTH AT BEDTIME.  warfarin (COUMADIN) 3 MG tablet Take 1/2 tablet on Mondays and Fridays, all other days take 1 tablet   Past Medical History:  Diagnosis Date  . Abdominal pain    RLQ  . Abnormal CT of the abdomen    abnormal lymph nodes  . Asthenia   . Carotid artery stenosis    right  . Erectile dysfunction   . Hyperlipidemia   . Hypertension   . Hypokalemia   . Hypokalemia   . Impetigo    hx of  . Low back pain   . PUD (peptic ulcer disease)    hx of  . 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 Tobacco abuse    Social History   Socioeconomic History  . Marital status: Married    Spouse name: Not on file  . Number of children: Not on file  . Years of education: Not on  file  . Highest education level: Not on file  Social Needs  . Financial resource strain: Not on file  . Food insecurity - worry: Not on file  . Food insecurity - inability: Not on file  . Transportation needs - medical: Not on file  . Transportation needs - non-medical: Not on file  Occupational History  . Not on file  Tobacco Use  . Smoking status: Light Tobacco Smoker    Packs/day: 0.30    Types: Cigarettes  . Smokeless tobacco: Former NeurosurgeonUser    Quit date: 08/12/1972  . Tobacco comment: 1/3  pak per day  Substance and Sexual Activity  . Alcohol use: No  . Drug use: No  . Sexual activity: Not on file  Other Topics Concern  . Not on file  Social History Narrative   Married, wife has multiple chronic medical issuesMichaelle Newton( [facility])   Family History  Problem Relation Age of Onset  . Hypertension Mother   . Hypertension Sister   . Diabetes Sister   . Stroke Father    ASSESSMENT Recent Results: Lab Results  Component Value Date   INR 1.6 07/31/2017   INR 2.3 05/19/2017  INR 2.10 03/10/2017   Anticoagulation Dosing: Description   Take 1/2 tablet on Thursdays, Saturdays and Sundays; all other days, take one (1) tablet of your 3mg  tan-colored warfarin tablets.      INR today: Subtherapeutic  PLAN Weekly dose was increased by 9% to 18 mg per week  Patient Instructions  Patient educated about medication as defined in this encounter and verbalized understanding by repeating back instructions provided.   Patient advised to contact clinic or seek medical attention if signs/symptoms of bleeding or thromboembolism occur.  Patient verbalized understanding by repeating back information and was advised to contact me if further medication-related questions arise. Patient was also provided an information handout.  Follow-up Return in about 2 weeks (around 08/14/2017).  Robert BoardJennifer Syna Newton

## 2017-07-31 NOTE — Addendum Note (Signed)
Addended by: KIM, JENNIFER J on: 07/31/2017 03:41 PM   Modules accepted: Level of Service  

## 2017-07-31 NOTE — Patient Instructions (Signed)
Patient educated about medication as defined in this encounter and verbalized understanding by repeating back instructions provided.   

## 2017-08-14 ENCOUNTER — Ambulatory Visit: Payer: Self-pay | Admitting: Pharmacist

## 2017-08-15 ENCOUNTER — Ambulatory Visit (INDEPENDENT_AMBULATORY_CARE_PROVIDER_SITE_OTHER): Payer: PPO | Admitting: Pharmacist

## 2017-08-15 DIAGNOSIS — Z8673 Personal history of transient ischemic attack (TIA), and cerebral infarction without residual deficits: Secondary | ICD-10-CM | POA: Diagnosis not present

## 2017-08-15 DIAGNOSIS — Z7901 Long term (current) use of anticoagulants: Secondary | ICD-10-CM | POA: Diagnosis not present

## 2017-08-15 LAB — POCT INR: INR: 2.1

## 2017-08-15 NOTE — Progress Notes (Signed)
Anticoagulation Management Robert Newton is a 72 y.o. male who reports to the clinic for monitoring of warfarin treatment.    Indication: Cerebrovascular Accident, Acute (Resolved) [I67.89], Long term current use of anticoagulants [Z79.01]. Duration: indefinite Supervising physician: Debe CoderEmily Mullen  Anticoagulation Clinic Visit History: Patient does not report signs/symptoms of bleeding or thromboembolism  Other recent changes: No diet, medications, lifestyle changes endorsed by the patient to me at this visit.  Anticoagulation Episode Summary    Current INR goal:   2.0-3.0  TTR:   87.3 % (3.7 y)  Next INR check:   09/08/2017  INR from last check:   2.10 (08/15/2017)  Weekly max warfarin dose:     Target end date:   Indefinite  INR check location:     Preferred lab:     Send INR reminders to:      Indications   CEREBROVASCULAR ACCIDENT ACUTE (Resolved) [I67.89] Long term current use of anticoagulant [Z79.01]       Comments:           No Known Allergies Prior to Admission medications   Medication Sig Start Date End Date Taking? Authorizing Provider  aspirin 81 MG EC tablet Take 81 mg by mouth daily.     Yes [provider]  atorvastatin (LIPITOR) 40 MG tablet TAKE 1 TABLET (40 MG TOTAL) BY MOUTH EVERY EVENING. 02/18/17  Yes Gwynn BurlyWallace, Andrew, DO  docusate sodium (COLACE) 100 MG capsule Take 100 mg by mouth daily.     Yes [provider]  omeprazole (PRILOSEC) 20 MG capsule TAKE 1 CAPSULE (20 MG TOTAL) BY MOUTH AT BEDTIME. 02/18/17  Yes Gwynn BurlyWallace, Andrew, DO  warfarin (COUMADIN) 3 MG tablet Take 1/2 tablet on Mondays and Fridays, all other days take 1 tablet 07/31/17  Yes Burns SpainButcher, Elizabeth A, MD   Past Medical History:  Diagnosis Date  . Abdominal pain    RLQ  . Abnormal CT of the abdomen    abnormal lymph nodes  . Asthenia   . Carotid artery stenosis    right  . Erectile dysfunction   . Hyperlipidemia   . Hypertension   . Hypokalemia   . Hypokalemia   .  Impetigo    hx of  . Low back pain   . PUD (peptic ulcer disease)    hx of  . 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 Tobacco abuse    Social History   Socioeconomic History  . Marital status: Married    Spouse name: Not on file  . Number of children: Not on file  . Years of education: Not on file  . Highest education level: Not on file  Social Needs  . Financial resource strain: Not on file  . Food insecurity - worry: Not on file  . Food insecurity - inability: Not on file  . Transportation needs - medical: Not on file  . Transportation needs - non-medical: Not on file  Occupational History  . Not on file  Tobacco Use  . Smoking status: Light Tobacco Smoker    Packs/day: 0.30    Types: Cigarettes  . Smokeless tobacco: Former NeurosurgeonUser    Quit date: 08/12/1972  . Tobacco comment: 1/3  pak per day  Substance and Sexual Activity  . Alcohol use: No  . Drug use: No  . Sexual activity: Not on file  Other Topics Concern  . Not on file  Social History Narrative   Married, wife has multiple chronic medical issuesMichaelle Newton( [facility]  patient)   Family History  Problem Relation Age of Onset  . Hypertension Mother   . Hypertension Sister   . Diabetes Sister   . Stroke Father     ASSESSMENT Recent Results: The most recent result is correlated with 18 mg per week: Lab Results  Component Value Date   INR 2.10 08/15/2017   INR 1.6 07/31/2017   INR 2.3 05/19/2017    Anticoagulation Dosing: Description   Take 1/2 tablet on Mondays, all other days, take one (1) tablet of your 3mg  tan-colored warfarin tablets.      INR today: Therapeutic  PLAN Weekly dose was increased by 8% to 19.5 mg per week  Patient Instructions  Patient instructed to take medications as defined in the Anti-coagulation Track section of this encounter.  Patient instructed to take today's dose.  Patient instructed to take 1/2 tablet on Mondays, all other days, take one (1) tablet of your 3mg  tan-colored  warfarin tablets.  Patient verbalized understanding of these instructions.     Patient advised to contact clinic or seek medical attention if signs/symptoms of bleeding or thromboembolism occur.  Patient verbalized understanding by repeating back information and was advised to contact me if further medication-related questions arise. Patient was also provided an information handout.  Follow-up Return in 3 weeks (on 09/08/2017) for Follow up INR at 0900h.  Elicia Lamp, PharmD, CACP, CPP  15 minutes spent face-to-face with the patient during the encounter. 50% of time spent on education. 50% of time was spent on fingerstick point of care INR sample collection, processing, results determination, dose adjustment and documentation in [email or web address]

## 2017-08-15 NOTE — Patient Instructions (Signed)
Patient instructed to take medications as defined in the Anti-coagulation Track section of this encounter.  Patient instructed to take today's dose.  Patient instructed to take 1/2 tablet on Mondays, all other days, take one (1) tablet of your 3mg  tan-colored warfarin tablets.  Patient verbalized understanding of these instructions.

## 2017-08-18 NOTE — Progress Notes (Signed)
I reviewed Dr. Groce's note.  

## 2017-09-08 ENCOUNTER — Ambulatory Visit: Payer: Self-pay

## 2017-09-09 ENCOUNTER — Ambulatory Visit (INDEPENDENT_AMBULATORY_CARE_PROVIDER_SITE_OTHER): Payer: PPO | Admitting: Pharmacist

## 2017-09-09 DIAGNOSIS — Z5181 Encounter for therapeutic drug level monitoring: Secondary | ICD-10-CM

## 2017-09-09 DIAGNOSIS — Z8673 Personal history of transient ischemic attack (TIA), and cerebral infarction without residual deficits: Secondary | ICD-10-CM | POA: Diagnosis not present

## 2017-09-09 DIAGNOSIS — Z7901 Long term (current) use of anticoagulants: Secondary | ICD-10-CM | POA: Diagnosis not present

## 2017-09-09 LAB — POCT INR: INR: 3.4

## 2017-09-09 NOTE — Progress Notes (Signed)
Anticoagulation Management Robert Newton a 72 y.o.malewho reports to the clinic for monitoring of warfarintreatment.   Indication:S/P CVA, long term anticoagulation for prevention of recurrence Duration:indefinite Supervising physician:Alexander Raines  Anticoagulation Clinic Visit History: Patientdoes notreport signs/symptoms of bleeding or thromboembolism. Other recent changes:Patient denies anydiet, medications,orlifestylechanges.  Anticoagulation Episode Summary    Current INR goal:   2.0-3.0  TTR:   87.0 % (3.7 y)  Next INR check:   09/22/2017  INR from last check:   3.4! (09/09/2017)  Weekly max warfarin dose:     Target end date:   Indefinite  INR check location:     Preferred lab:     Send INR reminders to:      Indications   CEREBROVASCULAR ACCIDENT ACUTE (Resolved) [I67.89] Long term current use of anticoagulant [Z79.01]       Comments:          No Known Allergies Medication Sig  aspirin 81 MG EC tablet Take 81 mg by mouth daily.    atorvastatin (LIPITOR) 40 MG tablet TAKE 1 TABLET (40 MG TOTAL) BY MOUTH EVERY EVENING.  docusate sodium (COLACE) 100 MG capsule Take 100 mg by mouth daily.    omeprazole (PRILOSEC) 20 MG capsule TAKE 1 CAPSULE (20 MG TOTAL) BY MOUTH AT BEDTIME.  warfarin (COUMADIN) 3 MG tablet Take 1/2 tablet on Mondays and Fridays, all other days take 1 tablet   Past Medical History:  Diagnosis Date  . Abdominal pain    RLQ  . Abnormal CT of the abdomen    abnormal lymph nodes  . Asthenia   . Carotid artery stenosis    right  . Erectile dysfunction   . Hyperlipidemia   . Hypertension   . Hypokalemia   . Hypokalemia   . Impetigo    hx of  . Low back pain   . PUD (peptic ulcer disease)    hx of  . 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 Tobacco abuse    Social History   Socioeconomic History  . Marital status: Married    Spouse name: Not on file  . Number of children: Not on file  . Years of education: Not on  file  . Highest education level: Not on file  Social Needs  . Financial resource strain: Not on file  . Food insecurity - worry: Not on file  . Food insecurity - inability: Not on file  . Transportation needs - medical: Not on file  . Transportation needs - non-medical: Not on file  Occupational History  . Not on file  Tobacco Use  . Smoking status: Light Tobacco Smoker    Packs/day: 0.30    Types: Cigarettes  . Smokeless tobacco: Former Neurosurgeon    Quit date: 08/12/1972  . Tobacco comment: 1/3  pak per day  Substance and Sexual Activity  . Alcohol use: No  . Drug use: No  . Sexual activity: Not on file  Other Topics Concern  . Not on file  Social History Narrative   Married, wife has multiple chronic medical issuesMichaelle Newton [facility] patient)   Family History  Problem Relation Age of Onset  . Hypertension Mother   . Hypertension Sister   . Diabetes Sister   . Stroke Father    ASSESSMENT Lab Results  Component Value Date   INR 3.4 09/09/2017   INR 2.10 08/15/2017   INR 1.6 07/31/2017   Anticoagulation Dosing: 19.5 mg   INR today: Supratherapeutic  PLAN Weekly dose  was decreased to 18 mg per week  Patient Instructions  Patient educated about medication as defined in this encounter and verbalized understanding by repeating back instructions provided.   Patient advised to contact clinic or seek medical attention if signs/symptoms of bleeding or thromboembolism occur.  Patient verbalized understanding by repeating back information and was advised to contact me if further medication-related questions arise. Patient was also provided an information handout.  Follow-up 2 weeks  Marzetta BoardJennifer Momin Misko

## 2017-09-09 NOTE — Patient Instructions (Signed)
Patient educated about medication as defined in this encounter and verbalized understanding by repeating back instructions provided.   

## 2017-09-10 NOTE — Progress Notes (Signed)
INTERNAL MEDICINE TEACHING ATTENDING ADDENDUM  I agree with pharmacy recommendations as outlined in their note.   Alexander N Raines, MD  

## 2017-09-15 ENCOUNTER — Other Ambulatory Visit: Payer: Self-pay | Admitting: Pharmacist

## 2017-09-15 ENCOUNTER — Ambulatory Visit (INDEPENDENT_AMBULATORY_CARE_PROVIDER_SITE_OTHER): Payer: PPO | Admitting: Pharmacist

## 2017-09-15 DIAGNOSIS — Z8673 Personal history of transient ischemic attack (TIA), and cerebral infarction without residual deficits: Secondary | ICD-10-CM | POA: Diagnosis not present

## 2017-09-15 DIAGNOSIS — Z7901 Long term (current) use of anticoagulants: Secondary | ICD-10-CM

## 2017-09-15 LAB — POCT INR: INR: 3

## 2017-09-15 MED ORDER — WARFARIN SODIUM 3 MG PO TABS: ORAL_TABLET | ORAL | 2 refills | Status: DC

## 2017-09-15 NOTE — Patient Instructions (Signed)
Patient instructed to take medications as defined in the Anti-coagulation Track section of this encounter.  Patient instructed to take today's dose. Patient instructed to take 1/2 tablet of your 3mg  tan warfarin tablets by mouth once daily on Mondays, Wednesdays, and Fridays; all other days, take 1 tablet by mouth once daily. Patient verbalized understanding of these instructions.

## 2017-09-15 NOTE — Progress Notes (Signed)
Anticoagulation Management Robert Newton is a 72 y.o. male who reports to the clinic for monitoring of warfarin treatment.    Indication: CVA  Duration: indefinite Supervising physician: Blanch Media  Anticoagulation Clinic Visit History: Patient does not report signs/symptoms of bleeding or thromboembolism  Other recent changes: No changes reported in diet, medications, lifestyle Anticoagulation Episode Summary    Current INR goal:   2.0-3.0  TTR:   86.6 % (3.8 y)  Next INR check:   10/06/2017  INR from last check:   3.0 (09/15/2017)  Weekly max warfarin dose:     Target end date:   Indefinite  INR check location:     Preferred lab:     Send INR reminders to:   ANTICOAG IMP   Indications   CEREBROVASCULAR ACCIDENT ACUTE (Resolved) [I67.89] Long term current use of anticoagulant [Z79.01]       Comments:           No Known Allergies Prior to Admission medications   Medication Sig Start Date End Date Taking? Authorizing Provider  aspirin 81 MG EC tablet Take 81 mg by mouth daily.      [provider]  atorvastatin (LIPITOR) 40 MG tablet TAKE 1 TABLET (40 MG TOTAL) BY MOUTH EVERY EVENING. 02/18/17   Gwynn Burly, DO  docusate sodium (COLACE) 100 MG capsule Take 100 mg by mouth daily.      [provider]  omeprazole (PRILOSEC) 20 MG capsule TAKE 1 CAPSULE (20 MG TOTAL) BY MOUTH AT BEDTIME. 02/18/17   Gwynn Burly, DO  warfarin (COUMADIN) 3 MG tablet Take 1/2 tablet on Mondays and Fridays, all other days take 1 tablet 07/31/17   Burns Spain, MD   Past Medical History:  Diagnosis Date  . Abdominal pain    RLQ  . Abnormal CT of the abdomen    abnormal lymph nodes  . Asthenia   . Carotid artery stenosis    right  . Erectile dysfunction   . Hyperlipidemia   . Hypertension   . Hypokalemia   . Hypokalemia   . Impetigo    hx of  . Low back pain   . PUD (peptic ulcer disease)    hx of  . 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 Tobacco  abuse    Social History   Socioeconomic History  . Marital status: Married    Spouse name: Not on file  . Number of children: Not on file  . Years of education: Not on file  . Highest education level: Not on file  Social Needs  . Financial resource strain: Not on file  . Food insecurity - worry: Not on file  . Food insecurity - inability: Not on file  . Transportation needs - medical: Not on file  . Transportation needs - non-medical: Not on file  Occupational History  . Not on file  Tobacco Use  . Smoking status: Light Tobacco Smoker    Packs/day: 0.30    Types: Cigarettes  . Smokeless tobacco: Former Neurosurgeon    Quit date: 08/12/1972  . Tobacco comment: 1/3  pak per day  Substance and Sexual Activity  . Alcohol use: No  . Drug use: No  . Sexual activity: Not on file  Other Topics Concern  . Not on file  Social History Narrative   Married, wife has multiple chronic medical issuesMichaelle Birks [facility] patient)   Family History  Problem Relation Age of Onset  . Hypertension Mother   .  Hypertension Sister   . Diabetes Sister   . Stroke Father     ASSESSMENT Recent Results: The most recent result is correlated with 18 mg per week: Lab Results  Component Value Date   INR 3.0 09/15/2017   INR 3.4 09/09/2017   INR 2.10 08/15/2017    Anticoagulation Dosing: Description   Take 1/2 tablet of your 3mg  tan warfarin tablets by mouth once daily on Mondays, Wednesdays, and Fridays; all other days, take 1 tablet by mouth once daily.     INR today: Therapeutic  PLAN Weekly dose was decreased by 8% to 16.5 mg per week  Patient Instructions  Patient instructed to take medications as defined in the Anti-coagulation Track section of this encounter.  Patient instructed to take today's dose. Patient instructed to take 1/2 tablet of your 3mg  tan warfarin tablets by mouth once daily on Mondays, Wednesdays, and Fridays; all other days, take 1 tablet by mouth once daily. Patient  verbalized understanding of these instructions.   Patient advised to contact clinic or seek medical attention if signs/symptoms of bleeding or thromboembolism occur.  Patient verbalized understanding by repeating back information and was advised to contact me if further medication-related questions arise. Patient was also provided an information handout.  Follow-up Return in 3 weeks (on 10/06/2017) for Follow up INR at 1045.  Roderic ScarceErin N. Zigmund Danieleja, PharmD PGY1 Pharmacy Resident Pager: [phone]  15 minutes spent face-to-face with the patient during the encounter. 50% of time spent on education. 50% of time was spent on point of care INR testing, results interpretation, dose adjustment, and documentation in [facility] and [email or web address].

## 2017-09-15 NOTE — Addendum Note (Signed)
Addended by: Margart SicklesEJA, ERIN N on: 09/15/2017 11:30 AM   Modules accepted: Orders

## 2017-09-25 DIAGNOSIS — F17210 Nicotine dependence, cigarettes, uncomplicated: Secondary | ICD-10-CM | POA: Diagnosis not present

## 2017-09-25 DIAGNOSIS — R4182 Altered mental status, unspecified: Secondary | ICD-10-CM | POA: Diagnosis not present

## 2017-09-25 DIAGNOSIS — Z8673 Personal history of transient ischemic attack (TIA), and cerebral infarction without residual deficits: Secondary | ICD-10-CM | POA: Diagnosis not present

## 2017-09-25 DIAGNOSIS — Z79899 Other long term (current) drug therapy: Secondary | ICD-10-CM | POA: Diagnosis not present

## 2017-09-25 DIAGNOSIS — R410 Disorientation, unspecified: Secondary | ICD-10-CM | POA: Diagnosis not present

## 2017-09-25 DIAGNOSIS — E876 Hypokalemia: Secondary | ICD-10-CM | POA: Diagnosis not present

## 2017-09-26 DIAGNOSIS — Z8673 Personal history of transient ischemic attack (TIA), and cerebral infarction without residual deficits: Secondary | ICD-10-CM | POA: Diagnosis not present

## 2017-09-26 DIAGNOSIS — E876 Hypokalemia: Secondary | ICD-10-CM | POA: Diagnosis not present

## 2017-09-26 DIAGNOSIS — R410 Disorientation, unspecified: Secondary | ICD-10-CM | POA: Diagnosis not present

## 2017-09-27 DIAGNOSIS — Z8673 Personal history of transient ischemic attack (TIA), and cerebral infarction without residual deficits: Secondary | ICD-10-CM | POA: Diagnosis not present

## 2017-09-27 DIAGNOSIS — E876 Hypokalemia: Secondary | ICD-10-CM | POA: Diagnosis not present

## 2017-09-27 DIAGNOSIS — R410 Disorientation, unspecified: Secondary | ICD-10-CM | POA: Diagnosis not present

## 2017-09-28 DIAGNOSIS — Z8673 Personal history of transient ischemic attack (TIA), and cerebral infarction without residual deficits: Secondary | ICD-10-CM | POA: Diagnosis not present

## 2017-09-28 DIAGNOSIS — R410 Disorientation, unspecified: Secondary | ICD-10-CM | POA: Diagnosis not present

## 2017-09-28 DIAGNOSIS — E876 Hypokalemia: Secondary | ICD-10-CM | POA: Diagnosis not present

## 2017-09-29 DIAGNOSIS — Z8673 Personal history of transient ischemic attack (TIA), and cerebral infarction without residual deficits: Secondary | ICD-10-CM | POA: Diagnosis not present

## 2017-09-29 DIAGNOSIS — R410 Disorientation, unspecified: Secondary | ICD-10-CM | POA: Diagnosis not present

## 2017-09-29 DIAGNOSIS — E876 Hypokalemia: Secondary | ICD-10-CM | POA: Diagnosis not present

## 2017-09-30 ENCOUNTER — Ambulatory Visit (INDEPENDENT_AMBULATORY_CARE_PROVIDER_SITE_OTHER): Payer: PPO | Admitting: Internal Medicine

## 2017-09-30 ENCOUNTER — Encounter: Payer: Self-pay | Admitting: Internal Medicine

## 2017-09-30 VITALS — BP 121/73 | HR 95 | Temp 97.8°F | Wt 132.9 lb

## 2017-09-30 DIAGNOSIS — Z79899 Other long term (current) drug therapy: Secondary | ICD-10-CM

## 2017-09-30 DIAGNOSIS — Z7901 Long term (current) use of anticoagulants: Secondary | ICD-10-CM | POA: Diagnosis not present

## 2017-09-30 DIAGNOSIS — Z8673 Personal history of transient ischemic attack (TIA), and cerebral infarction without residual deficits: Secondary | ICD-10-CM

## 2017-09-30 DIAGNOSIS — I6521 Occlusion and stenosis of right carotid artery: Secondary | ICD-10-CM | POA: Diagnosis not present

## 2017-09-30 DIAGNOSIS — Z8659 Personal history of other mental and behavioral disorders: Secondary | ICD-10-CM

## 2017-09-30 DIAGNOSIS — I10 Essential (primary) hypertension: Secondary | ICD-10-CM

## 2017-09-30 DIAGNOSIS — E785 Hyperlipidemia, unspecified: Secondary | ICD-10-CM

## 2017-09-30 DIAGNOSIS — Z7982 Long term (current) use of aspirin: Secondary | ICD-10-CM

## 2017-09-30 DIAGNOSIS — Z09 Encounter for follow-up examination after completed treatment for conditions other than malignant neoplasm: Secondary | ICD-10-CM | POA: Diagnosis not present

## 2017-09-30 DIAGNOSIS — F17210 Nicotine dependence, cigarettes, uncomplicated: Secondary | ICD-10-CM

## 2017-09-30 DIAGNOSIS — I6529 Occlusion and stenosis of unspecified carotid artery: Secondary | ICD-10-CM

## 2017-09-30 DIAGNOSIS — R404 Transient alteration of awareness: Secondary | ICD-10-CM | POA: Insufficient documentation

## 2017-09-30 MED ORDER — ATORVASTATIN CALCIUM 10 MG PO TABS
10.00 | ORAL_TABLET | ORAL | Status: DC
Start: 2017-09-29 — End: 2017-09-30

## 2017-09-30 MED ORDER — ALUMINUM-MAGNESIUM-SIMETHICONE 200-200-20 MG/5ML PO SUSP
30.00 | ORAL | Status: DC
Start: ? — End: 2017-09-30

## 2017-09-30 NOTE — Assessment & Plan Note (Signed)
Patient presented for hospital follow-up after he was hospitalized for altered mental status in [place]. Review of medical records, including CT and MRI head did not reveal any acute etiology. Review of the patient's medications do not reveal any that could have contributed to his altered mental status.  Patient does carotid artery stenosis with 80 to 99% stenosis of his right internal carotid artery (most recent imaging 2016). He was not taken for surgery as it was felt that he was not a candidate and is on Coumadin. It is possible this was a TIA.   At this point it does appear that the patient has returned to baseline. No further investigation at this point.

## 2017-09-30 NOTE — Assessment & Plan Note (Signed)
Review of patient's medical records indicate that the patient was on Coumadin for right internal carotid artery stenosis greater than 80%. During review of the patient's medical record I cannot find any other indication for the patient to be on Coumadin. The patient would like to come off Coumadin and understands the risks associated with it. I do think this is appropriate given that Coumadin is not standard of care for medical management of carotid artery stenosis.  Discussed with the patient continuing his atorvastatin, smoking sensation, aspirin 81 mg daily, and blood pressure control. He will follow up with his primary care provider in three months.

## 2017-09-30 NOTE — Patient Instructions (Signed)
Thank you for allowing us to provide your care. Please stop your warfarin (Coumadin). Please continue your other medications as prescribed. It's important for your health to quit smoking. If you decide you would like help with this including nicotine patches or gum please don't hesitate to give us a call. Please come back to see her primary care doctor, Dr. Earlene PlaterWallace, in three months or sooner if anything should arise.

## 2017-09-30 NOTE — Progress Notes (Signed)
   CC: Hospital follow-up for confusion   HPI:  [name] is a 72 y.o. HTN, HLD, tobacco abuse, and previous right hemisphere CVA presented to the clinic for follow-up after recently being hospitalized in [place] for an episode of altered mental status. Per review of the chart the patient was found to [place] aimlessly [place]. He was unable to answer specific questions as to why he was there. Workup including CT had an MRI head was unremarkable and did not reveal any acute etiology. The patient was discharged with instructions to follow-up with his primary care.  Participation he drove up to around [place] to drop a friend off. After dropping his friend off he then went to [place] and made a stop. While he was stopped at some bystanders called the ambulance because they felt that he was confused. He was then taken to the hospital for further evaluation. Per the patient he did not feel that he was confused and was not sure why they wanted to take him to the hospital. He does feel that he has back to his baseline and is able to converse about the past week and what he has done since being back to [place]. He does confirm facts that were noted in a review of the medical record and that correlate with the notes from his recent hospitalization.  He denies any headaches, blurry vision, focal weakness/numbness, shortness of breath, palpitations, cough, fevers, muscle aches, joint aches, or any other symptoms.  Past Medical History:  Diagnosis Date  . Abdominal pain    RLQ  . Abnormal CT of the abdomen    abnormal lymph nodes  . Asthenia   . Carotid artery stenosis    right  . Erectile dysfunction   . Hyperlipidemia   . Hypertension   . Hypokalemia   . Hypokalemia   . Impetigo    hx of  . Low back pain   . PUD (peptic ulcer disease)    hx of  . Stroke [facility](HCC) 2004   left hemiparesis  . Tobacco abuse    Review of Systems:   12 point ROS  preformed. All negative aside from those mentioned in the HPI.  Physical Exam: Vitals:   09/30/17 1317  BP: 121/73  Pulse: 95  Temp: 97.8 F (36.6 C)  TempSrc: Oral  SpO2: 100%   General: Well nourished male in no acute distress HENT: Normocephalic, atraumatic, moist mucus membranes  Pulm: Good air movement with no wheezing or crackles  CV: RRR, no murmurs, no rubs  Abdomen: Active bowel sounds, soft, non-distended, no tenderness to palpation  Extremities: Pulses palpable in the upper extremities bilaterally, no LE edema  Skin: Warm and dry  Neuro: Alert and oriented x3. Cranial nerves intact bilaterally. Growth strength 5/5 in the upper and lower extremities. Sensation to light touch intact in the upper and lower extremities.  Assessment & Plan:   See Encounters Tab for problem based charting.  Patient discussed with Dr. Cleda DaubE. Hoffman

## 2017-10-02 ENCOUNTER — Other Ambulatory Visit: Payer: Self-pay

## 2017-10-02 NOTE — Progress Notes (Signed)
Internal Medicine Clinic Attending  Case discussed with Dr. Helberg at the time of the visit.  We reviewed the resident's history and exam and pertinent patient test results.  I agree with the assessment, diagnosis, and plan of care documented in the resident's note.    

## 2017-10-02 NOTE — Patient Outreach (Signed)
[facility]) Care Management  10/02/2017  Merilynn FinlandMack Hildenbrand 11-23-1945 [id number]     Transition of Care Referral  Referral Date: 10/02/17 Referral Source: [facility] Discharge Report Date of Admission: 09/25/17-09/29/17 Diagnosis:confusion Date of Discharge: 09/29/17 [facility] Insurance: [facility]   Outreach attempt # 1 to patient. Spoke with patient who voices he is doing well since return home. He denies any new and/or worsening issues. He reports that he goes for MD f/u appt on 10/06/17. He states he has a "brand new car " and plans to drive himself. He voices he is independent and self sufficient. RN CM confirmed with patient he has all his meds but patient unable to complete med review at this time as he voices he does not have meds with him. He did report that he will be talking to his MD at appt about his Coumadin. He states that he does not plan to take med. He is aware of reason for med but feels like "it would be too much" since he has also been taking ASA for several years now. He denies any issues with affording/managing his meds. He reports he has no RN CM needs or concerns and does not feel like he needs [facility] services at this time. He was appreciative of follow up call.     Plan: RN CM will notify [facility] administrative assistant of case status. RN CM will send [facility] brochure along with magnet to patient.    Antionette Fairyoshanda Romualdo Prosise, RN,BSN,CCM [facility] Care Management Telephonic Care Management Coordinator Direct Phone: [phone] Toll Free: [phone] Fax: [phone]

## 2017-10-06 ENCOUNTER — Ambulatory Visit (INDEPENDENT_AMBULATORY_CARE_PROVIDER_SITE_OTHER): Payer: PPO | Admitting: Pharmacist

## 2017-10-06 DIAGNOSIS — Z7901 Long term (current) use of anticoagulants: Secondary | ICD-10-CM

## 2017-10-06 DIAGNOSIS — Z5181 Encounter for therapeutic drug level monitoring: Secondary | ICD-10-CM | POA: Diagnosis not present

## 2017-10-06 DIAGNOSIS — Z8673 Personal history of transient ischemic attack (TIA), and cerebral infarction without residual deficits: Secondary | ICD-10-CM | POA: Diagnosis not present

## 2017-10-06 NOTE — Patient Instructions (Signed)
Your warfarin has been DISCONTINUED. Do NOT take any more warfarin.

## 2017-10-06 NOTE — Progress Notes (Signed)
Patient's warfarin has been discontinued. Patient had me dispose of his warfarin--witnessed by Dr. Verlan FriendsErin Deja.

## 2017-10-20 ENCOUNTER — Ambulatory Visit: Payer: Self-pay | Admitting: Pharmacist

## 2018-01-04 ENCOUNTER — Other Ambulatory Visit: Payer: Self-pay | Admitting: Internal Medicine

## 2018-02-07 ENCOUNTER — Encounter: Payer: Self-pay | Admitting: *Deleted

## 2019-01-30 ENCOUNTER — Encounter: Payer: Self-pay | Admitting: *Deleted

## 2020-10-10 DEATH — deceased
